# Patient Record
Sex: Male | Born: 1945 | Race: Black or African American | Hispanic: No
Health system: Southern US, Community
[De-identification: ages and names within clinical notes are randomized; demographics above are authoritative.]

## PROBLEM LIST (undated history)

## (undated) DIAGNOSIS — K219 Gastro-esophageal reflux disease without esophagitis: Secondary | ICD-10-CM

## (undated) DIAGNOSIS — M1990 Unspecified osteoarthritis, unspecified site: Secondary | ICD-10-CM

## (undated) DIAGNOSIS — F1010 Alcohol abuse, uncomplicated: Secondary | ICD-10-CM

## (undated) DIAGNOSIS — E119 Type 2 diabetes mellitus without complications: Secondary | ICD-10-CM

## (undated) DIAGNOSIS — I10 Essential (primary) hypertension: Secondary | ICD-10-CM

## (undated) DIAGNOSIS — M109 Gout, unspecified: Secondary | ICD-10-CM

## (undated) DIAGNOSIS — E0590 Thyrotoxicosis, unspecified without thyrotoxic crisis or storm: Secondary | ICD-10-CM

## (undated) HISTORY — DX: Type 2 diabetes mellitus without complications: E11.9

## (undated) HISTORY — PX: COLONOSCOPY: SHX174

## (undated) HISTORY — PX: NO PAST SURGERIES: SHX2092

---

## 2005-08-01 ENCOUNTER — Emergency Department (HOSPITAL_COMMUNITY): Admission: EM | Admit: 2005-08-01 | Discharge: 2005-08-01 | Payer: Self-pay | Admitting: Emergency Medicine

## 2005-11-05 ENCOUNTER — Emergency Department (HOSPITAL_COMMUNITY): Admission: EM | Admit: 2005-11-05 | Discharge: 2005-11-05 | Payer: Self-pay | Admitting: Emergency Medicine

## 2005-12-29 ENCOUNTER — Ambulatory Visit (HOSPITAL_COMMUNITY): Admission: RE | Admit: 2005-12-29 | Discharge: 2005-12-29 | Payer: Self-pay | Admitting: Gastroenterology

## 2006-02-22 ENCOUNTER — Emergency Department (HOSPITAL_COMMUNITY): Admission: EM | Admit: 2006-02-22 | Discharge: 2006-02-22 | Payer: Self-pay | Admitting: Emergency Medicine

## 2006-07-19 ENCOUNTER — Emergency Department (HOSPITAL_COMMUNITY): Admission: EM | Admit: 2006-07-19 | Discharge: 2006-07-19 | Payer: Self-pay | Admitting: Emergency Medicine

## 2007-08-23 ENCOUNTER — Emergency Department (HOSPITAL_COMMUNITY): Admission: EM | Admit: 2007-08-23 | Discharge: 2007-08-24 | Payer: Self-pay | Admitting: Emergency Medicine

## 2007-08-24 ENCOUNTER — Inpatient Hospital Stay (HOSPITAL_COMMUNITY): Admission: RE | Admit: 2007-08-24 | Discharge: 2007-08-27 | Payer: Self-pay | Admitting: Psychiatry

## 2007-08-24 ENCOUNTER — Ambulatory Visit: Payer: Self-pay | Admitting: Psychiatry

## 2009-01-09 ENCOUNTER — Encounter: Admission: RE | Admit: 2009-01-09 | Discharge: 2009-01-09 | Payer: Self-pay | Admitting: Family Medicine

## 2011-04-14 NOTE — H&P (Signed)
[name], [name]              ACCOUNT NO.:  [id number]   MEDICAL RECORD NO.:  [id number]          PATIENT TYPE:  IPS   LOCATION:  0504                          FACILITY:  BH   PHYSICIAN:  Geoffery Lyons, M.D.      DATE OF BIRTH:  10/04/1946   DATE OF ADMISSION:  08/24/2007  DATE OF DISCHARGE:                       PSYCHIATRIC ADMISSION ASSESSMENT   IDENTIFYING INFORMATION:  This is a 65 year old single male voluntarily  admitted on August 23, 2007.   HISTORY OF PRESENT ILLNESS:  The patient presents with a history of  alcohol and Xanax use.  Has been drinking liquor daily along with taking  1 mg of Xanax every day.  He reports he has had episodes of drinking at  work to help him out with his nerves.  His last drink was 3-4 days ago.  He denies any specific stressors.  He denies any depression and suicidal  thoughts.   PAST PSYCHIATRIC HISTORY:  This is the first admission to [facility].  No other psychiatric admissions.  No history of being  detoxed prior.   SOCIAL HISTORY:  This is a 65 year old male.  He currently lives with  his girlfriend.  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  No legal problems.   FAMILY HISTORY:  None.   ALCOHOL/DRUG HISTORY:  The patient is a nonsmoker.  He has again been  using benzodiazepines.  Denies any other drug use.   PRIMARY CARE PHYSICIAN:  Dr. Thermon Leyland and Dr. Loleta Chance in [place].   MEDICAL HISTORY:  Hypertension and gastroesophageal reflux disease.   MEDICATIONS:  Has been Atacand and hydrochlorothiazide and Prevacid 30  mg daily.   ALLERGIES:  No known allergies.   PHYSICAL EXAMINATION:  The patient was fully assessed at [facility]gency [facility] where he did receive some Ativan.  Temperature is  99.2, heart rate 73, respirations 20, blood pressure 118/73.   LABORATORY DATA:  His glucose is 123, hemoglobin 18.4 with hematocrit  54, creatinine 1.4.  Urine drug screen was positive for  benzodiazepines.  Alcohol level 178.   MENTAL STATUS EXAM:  This is a middle-aged male, sleepy.  Was awakened  to do the interview.  He was cooperative with good eye contact.  His  speech is clear, normal pace and tone.  The patient's mood is neutral.  The patient's affect is appropriate to circumstances.  Thought processes  with no evidence of any thought disorder.  Cognitive function intact.  His memory is good.  Judgment and insight are good.  Poor impulse  control.  He appears sincere.   DIAGNOSES:  AXIS I:  Alcohol dependence.  Benzodiazepines abuse; rule  out dependence.  AXIS II:  Deferred.  AXIS III:  Hypertension and gastroesophageal reflux disease.  AXIS IV:  Deferred.  AXIS IV:  40.   PLAN:  To contract for safety.  Will detox the patient with the Librium  protocol which was reviewed.  Will work on relapse prevention.  Will  resume his current medications.  Casemanager will assess his follow-up  and will also assess  any comorbidities as the patient continues to  detox.  Will also encourage group activity.   TENTATIVE LENGTH OF STAY:  Four to five days.      Landry Corporal, N.P.      Geoffery Lyons, M.D.  Electronically Signed    JO/MEDQ  D:  08/24/2007  T:  08/24/2007  Job:  [id number]

## 2011-04-17 NOTE — Discharge Summary (Signed)
NAMEBREYLIN, DOM              ACCOUNT NO.:  [id number]   MEDICAL RECORD NO.:  [id number]          PATIENT TYPE:  IPS   LOCATION:  0504                          FACILITY:  BH   PHYSICIAN:  Geoffery Lyons, M.D.      DATE OF BIRTH:  07-Sep-1946   DATE OF ADMISSION:  08/24/2007  DATE OF DISCHARGE:  08/27/2007                               DISCHARGE SUMMARY   CHIEF COMPLAINT AND PRESENT ILLNESS:  This was the first admission to  [facility] for this 65 year old single male  voluntarily admitted.  Presented with a history of alcohol and Xanax  abuse.  Has been drinking liquor daily along with taking 1 mg of Xanax  every day.  He had episodes of drinking at work to help him out with  his nerves.  Last drink was 3-4 days prior to this admission.   PAST PSYCHIATRIC HISTORY:  First time at [facility].  No other  psychiatric admissions.   ALCOHOL/DRUG HISTORY:  Has been drinking on a regular basis, mostly  liquor and taking Xanax.   MEDICAL HISTORY:  Hypertension, gastroesophageal reflux.   MEDICATIONS:  Had been on Atacand and hydrochlorothiazide and Prevacid  30 mg per day.   PHYSICAL EXAMINATION:  Performed and failed to show any acute findings.   LABORATORY DATA:  Glucose 123, hemoglobin 18.4, creatinine 1.4.  UDS  positive for benzodiazepines.  Alcohol level 178.   MENTAL STATUS EXAM:  Male that initially was pretty sleepy but awakened  to the interview, cooperative, good eye contact.  Speech was clear,  normal rate, tempo and production.  Mood anxious.  Affect broad.  Thought processes logical, coherent and relevant.  No evidence of  delusions.  No active suicidal or homicidal ideation.  No  hallucinations.  Cognition well-preserved.   ADMISSION DIAGNOSES:  AXIS I:  Alcohol dependence.  Benzodiazepine  abuse; rule out dependence.  AXIS II:  No diagnosis.  AXIS III:  Hypertension, gastroesophageal reflux.  AXIS IV:  Moderate.  AXIS V:  GAF upon  admission 35; highest GAF in the last year 60.   HOSPITAL COURSE:  He was admitted and started in individual group  psychotherapy.  Was detoxified with Librium.  He was given trazodone for  sleep.  He was maintained on the Atacand/hydrochlorothiazide 32/12.5 mg  per day and the Prevacid 30 mg per day.  He was given some Vistaril as  needed for anxiety.  Endorsed that he was trying to get his life  together, drinking a pint day, also a Xanax 1 mg in the morning.  For  the last 3-4 months, had been drinking a pint of liquor.  Endorsed he  has a little nerve problem, taking a Xanax every now and then.  Endorsed that he could stop drinking for years.  He is actually retired.  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  No children.  He did quit for 5-6 months.  Endorsed that he  was messing around with the wrong people when he relapsed.  On August 25, 2007, he was still having a hard time.  Usually anxious, especially  in the morning.  Using a piece of the Xanax in the morning.  He endorsed  that he was also drinking with the Xanax.  We pursued detox.  We worked  on Pharmacologist.  We worked on relapse prevention.  He continued to  endorse some anxiety in the morning.  We tried some Vistaril.  On  August 27, 2007, he reported feeling great.  Feeling that he was  ready to be discharged.  Was planning to go to church and keep busy.  He  endorsed no suicidal or homicidal ideation.  There were no evidence of  acute withdrawal so we went ahead and discharged to outpatient follow-  up.   DISCHARGE DIAGNOSES:  AXIS I:  Alcohol dependence.  Benzodiazepine  abuse.  Anxiety disorder not otherwise specified.  AXIS II:  No diagnosis.  AXIS III:  Arterial hypertension, gastroesophageal reflux.  AXIS IV:  Moderate.  AXIS V:  GAF upon discharge 55-60.   DISCHARGE MEDICATIONS:  1. Atacand/hydrochlorothiazide 32/12.5 mg, 1 daily.  2. Prevacid 30 mg per day.  3.  Trazodone 50 mg, 1 at bedtime as needed for sleep.  4. Vistaril 25 mg, 1 twice a day as needed for anxiety.   FOLLOWUP:  ADS in [place].      Geoffery Lyons, M.D.  Electronically Signed     IL/MEDQ  D:  09/27/2007  T:  09/27/2007  Job:  [id number]

## 2011-04-18 ENCOUNTER — Emergency Department (HOSPITAL_COMMUNITY)
Admission: EM | Admit: 2011-04-18 | Discharge: 2011-04-19 | Disposition: A | Discharge status: Other Acute Inpatient Hospital | Payer: 59 | Attending: Emergency Medicine | Admitting: Emergency Medicine

## 2011-04-18 DIAGNOSIS — E039 Hypothyroidism, unspecified: Secondary | ICD-10-CM | POA: Insufficient documentation

## 2011-04-18 DIAGNOSIS — R1013 Epigastric pain: Secondary | ICD-10-CM | POA: Insufficient documentation

## 2011-04-18 DIAGNOSIS — K2990 Gastroduodenitis, unspecified, without bleeding: Secondary | ICD-10-CM | POA: Insufficient documentation

## 2011-04-18 DIAGNOSIS — K2970 Gastritis, unspecified, without bleeding: Secondary | ICD-10-CM | POA: Insufficient documentation

## 2011-04-18 DIAGNOSIS — I10 Essential (primary) hypertension: Secondary | ICD-10-CM | POA: Insufficient documentation

## 2011-04-18 DIAGNOSIS — F1010 Alcohol abuse, uncomplicated: Secondary | ICD-10-CM | POA: Insufficient documentation

## 2011-04-18 DIAGNOSIS — E119 Type 2 diabetes mellitus without complications: Secondary | ICD-10-CM | POA: Insufficient documentation

## 2011-04-18 LAB — CBC
HCT: 42.9 % (ref 39.0–52.0)
Hemoglobin: 14.6 g/dL (ref 13.0–17.0)
MCV: 90.9 fL (ref 78.0–100.0)
RBC: 4.72 MIL/uL (ref 4.22–5.81)
RDW: 15 % (ref 11.5–15.5)
WBC: 4.4 10*3/uL (ref 4.0–10.5)

## 2011-04-18 LAB — DIFFERENTIAL
Basophils Absolute: 0 10*3/uL (ref 0.0–0.1)
Eosinophils Relative: 3 % (ref 0–5)
Lymphocytes Relative: 55 % — ABNORMAL HIGH (ref 12–46)
Lymphs Abs: 2.4 10*3/uL (ref 0.7–4.0)
Neutro Abs: 1.5 10*3/uL — ABNORMAL LOW (ref 1.7–7.7)
Neutrophils Relative %: 34 % — ABNORMAL LOW (ref 43–77)

## 2011-04-18 LAB — BASIC METABOLIC PANEL
CO2: 27 mEq/L (ref 19–32)
Calcium: 9.7 mg/dL (ref 8.4–10.5)
GFR calc Af Amer: >60 mL/min (ref >60)
GFR calc non Af Amer: >60 mL/min (ref >60)
Glucose, Bld: 132 mg/dL — ABNORMAL HIGH (ref 70–99)
Potassium: 3.8 mEq/L (ref 3.5–5.1)
Sodium: 140 mEq/L (ref 135–145)

## 2011-04-18 LAB — RAPID URINE DRUG SCREEN, HOSP PERFORMED
Benzodiazepines: NOT DETECTED
Cocaine: NOT DETECTED
Opiates: NOT DETECTED
Tetrahydrocannabinol: NOT DETECTED

## 2011-04-19 ENCOUNTER — Inpatient Hospital Stay (HOSPITAL_COMMUNITY)
Admission: EM | Admit: 2011-04-19 | Discharge: 2011-04-22 | DRG: 897 | Disposition: A | Discharge status: Home / Self Care | Payer: 59 | Source: Other Acute Inpatient Hospital | Attending: Psychiatry | Admitting: Psychiatry

## 2011-04-19 DIAGNOSIS — E119 Type 2 diabetes mellitus without complications: Secondary | ICD-10-CM

## 2011-04-19 DIAGNOSIS — F1020 Alcohol dependence, uncomplicated: Principal | ICD-10-CM

## 2011-04-19 DIAGNOSIS — R109 Unspecified abdominal pain: Secondary | ICD-10-CM

## 2011-04-19 DIAGNOSIS — E039 Hypothyroidism, unspecified: Secondary | ICD-10-CM

## 2011-04-19 DIAGNOSIS — I10 Essential (primary) hypertension: Secondary | ICD-10-CM

## 2011-04-19 LAB — GLUCOSE, CAPILLARY

## 2011-04-20 LAB — GLUCOSE, CAPILLARY: Glucose-Capillary: 115 mg/dL — ABNORMAL HIGH (ref 70–99)

## 2011-04-21 LAB — HEPATIC FUNCTION PANEL
Albumin: 3.4 g/dL — ABNORMAL LOW (ref 3.5–5.2)
Indirect Bilirubin: 0.3 mg/dL (ref 0.3–0.9)
Total Protein: 6.5 g/dL (ref 6.0–8.3)

## 2011-04-21 LAB — GLUCOSE, CAPILLARY: Glucose-Capillary: 166 mg/dL — ABNORMAL HIGH (ref 70–99)

## 2011-04-22 LAB — GLUCOSE, CAPILLARY: Glucose-Capillary: 122 mg/dL — ABNORMAL HIGH (ref 70–99)

## 2011-04-24 NOTE — Discharge Summary (Signed)
  Kevin [name], FELDHAUS              ACCOUNT NO.:  [id number]  MEDICAL RECORD NO.:  [id number]           PATIENT TYPE:  I  LOCATION:  0307                          FACILITY:  BH  PHYSICIAN:  Franchot Gallo, MD     DATE OF BIRTH:  28-Jan-1946  DATE OF ADMISSION:  04/19/2011 DATE OF DISCHARGE:  04/22/2011                              DISCHARGE SUMMARY   REASON FOR ADMISSION:  This is a 65 year old male that was admitted with a history of alcohol abuse drinking four pints of brandy for the past 3 months, drinking it straight.  He was having some medical issues and problems with stomach pain.  He denied any suicidal thoughts.  FINAL IMPRESSION:  AXIS I:  Alcohol dependence. AXIS II:  Deferred. AXIS III:  History of hypertension, diabetes, hypothyroidism. AXIS IV:  Recently retired. AXIS V:  55.  LABORATORY DATA:  Glucose was elevated at 132.  Urine drug screen was negative.  His alcohol level was 240. SIGNIFICANT FINDINGS:  This was a tremulous and anxious male.  Speech was clear.  He denied any suicidal or homicidal thoughts or psychotic symptoms.  His attention and concentration were poor.  Insight and judgment were satisfactory.  He was admitted to the substance abuse program.  We monitored his withdrawal symptoms.  He was placed on the Librium protocol and assessed his motivation for rehab.  He was beginning to feel a little better.  His appetite was returning.  Having some withdrawal symptoms.  His sleep was improving, feeling more calm. He was doing well on his detox medications, but still feeling a little bit shaky.  Denied any suicidal thoughts.  Did not commit to attending AA meetings.  On day of discharge, the patient was sleeping well.  His appetite was good.  He was having no depression.  No suicidal or homicidal thoughts or auditory hallucinations.  Having some mild anxiety.  No alcohol withdrawal symptoms.  He was stable for discharge.  DISCHARGE MEDICATIONS: 1.  Vistaril 25 mg one b.i.d. p.r.n. 2. Levothyroxine 100 mg daily. 3. Metformin 1000 mg nightly. 4. Tribenzor one tablet daily. 5. Vitamin D weekly.  FOLLOW UP:  He has a follow up appointment with [facility].  He was provided a schedule.     Landry Corporal, N.P.   ______________________________ Franchot Gallo, MD    JO/MEDQ  D:  04/23/2011  T:  04/24/2011  Job:  [id number]  Electronically Signed by Limmie PatriciaP. on 04/24/2011 02:16:31 PM Electronically Signed by Franchot Gallo MD on 04/24/2011 05:01:40 PM

## 2011-05-20 ENCOUNTER — Emergency Department (HOSPITAL_COMMUNITY): Payer: 59

## 2011-05-20 ENCOUNTER — Inpatient Hospital Stay (HOSPITAL_COMMUNITY)
Admission: AD | Admit: 2011-05-20 | Discharge: 2011-05-25 | DRG: 897 | Disposition: A | Discharge status: Home / Self Care | Payer: 59 | Source: Ambulatory Visit | Attending: Psychiatry | Admitting: Psychiatry

## 2011-05-20 ENCOUNTER — Emergency Department (HOSPITAL_COMMUNITY)
Admission: EM | Admit: 2011-05-20 | Discharge: 2011-05-20 | Disposition: A | Discharge status: Other Acute Inpatient Hospital | Payer: 59 | Attending: Emergency Medicine | Admitting: Emergency Medicine

## 2011-05-20 DIAGNOSIS — E039 Hypothyroidism, unspecified: Secondary | ICD-10-CM | POA: Insufficient documentation

## 2011-05-20 DIAGNOSIS — I10 Essential (primary) hypertension: Secondary | ICD-10-CM

## 2011-05-20 DIAGNOSIS — E119 Type 2 diabetes mellitus without complications: Secondary | ICD-10-CM

## 2011-05-20 DIAGNOSIS — F1020 Alcohol dependence, uncomplicated: Principal | ICD-10-CM

## 2011-05-20 DIAGNOSIS — R259 Unspecified abnormal involuntary movements: Secondary | ICD-10-CM

## 2011-05-20 DIAGNOSIS — R5383 Other fatigue: Secondary | ICD-10-CM | POA: Insufficient documentation

## 2011-05-20 DIAGNOSIS — R5381 Other malaise: Secondary | ICD-10-CM | POA: Insufficient documentation

## 2011-05-20 DIAGNOSIS — F10239 Alcohol dependence with withdrawal, unspecified: Secondary | ICD-10-CM

## 2011-05-20 DIAGNOSIS — F10939 Alcohol use, unspecified with withdrawal, unspecified: Secondary | ICD-10-CM

## 2011-05-20 LAB — CBC
HCT: 44.8 % (ref 39.0–52.0)
Hemoglobin: 15 g/dL (ref 13.0–17.0)
MCH: 30.2 pg (ref 26.0–34.0)
RBC: 4.96 MIL/uL (ref 4.22–5.81)

## 2011-05-20 LAB — COMPREHENSIVE METABOLIC PANEL
ALT: 70 U/L — ABNORMAL HIGH (ref 0–53)
Albumin: 3.3 g/dL — ABNORMAL LOW (ref 3.5–5.2)
Alkaline Phosphatase: 59 U/L (ref 39–117)
Calcium: 9.4 mg/dL (ref 8.4–10.5)
GFR calc Af Amer: >60 mL/min (ref >60)
Glucose, Bld: 135 mg/dL — ABNORMAL HIGH (ref 70–99)
Potassium: 3.8 mEq/L (ref 3.5–5.1)
Sodium: 141 mEq/L (ref 135–145)
Total Protein: 7.3 g/dL (ref 6.0–8.3)

## 2011-05-20 LAB — DIFFERENTIAL
Basophils Absolute: 0 10*3/uL (ref 0.0–0.1)
Basophils Relative: 0 % (ref 0–1)
Lymphocytes Relative: 25 % (ref 12–46)
Monocytes Absolute: 0.3 10*3/uL (ref 0.1–1.0)
Monocytes Relative: 4 % (ref 3–12)
Neutro Abs: 5.2 10*3/uL (ref 1.7–7.7)
Neutrophils Relative %: 69 % (ref 43–77)

## 2011-05-20 LAB — GLUCOSE, CAPILLARY

## 2011-05-20 LAB — RAPID URINE DRUG SCREEN, HOSP PERFORMED
Amphetamines: NOT DETECTED
Barbiturates: NOT DETECTED
Cocaine: NOT DETECTED
Opiates: NOT DETECTED
Tetrahydrocannabinol: NOT DETECTED

## 2011-05-21 DIAGNOSIS — F1020 Alcohol dependence, uncomplicated: Secondary | ICD-10-CM

## 2011-05-21 LAB — GLUCOSE, CAPILLARY
Glucose-Capillary: 164 mg/dL — ABNORMAL HIGH (ref 70–99)
Glucose-Capillary: 129 mg/dL — ABNORMAL HIGH (ref 70–99)

## 2011-05-22 LAB — GLUCOSE, CAPILLARY: Glucose-Capillary: 132 mg/dL — ABNORMAL HIGH (ref 70–99)

## 2011-05-23 LAB — GLUCOSE, CAPILLARY
Glucose-Capillary: 141 mg/dL — ABNORMAL HIGH (ref 70–99)
Glucose-Capillary: 124 mg/dL — ABNORMAL HIGH (ref 70–99)

## 2011-05-24 LAB — GLUCOSE, CAPILLARY
Glucose-Capillary: 141 mg/dL — ABNORMAL HIGH (ref 70–99)
Glucose-Capillary: 107 mg/dL — ABNORMAL HIGH (ref 70–99)

## 2011-05-25 LAB — GLUCOSE, CAPILLARY: Glucose-Capillary: 104 mg/dL — ABNORMAL HIGH (ref 70–99)

## 2011-05-25 NOTE — H&P (Signed)
NAMERODEL, GLASPY              ACCOUNT NO.:  [id number]  MEDICAL RECORD NO.:  [id number]  LOCATION:  0301                          FACILITY:  BH  PHYSICIAN:  Debbora Lacrosse, MD       DATE OF BIRTH:  1945-12-21  DATE OF ADMISSION:  05/20/2011 DATE OF DISCHARGE:                      PSYCHIATRIC ADMISSION ASSESSMENT   This is a 65 year old male admitted on May 20, 2011.  HISTORY OF PRESENT ILLNESS:  The patient is here to get detoxed off alcohol.  He reports he was sober for 3 weeks after his discharge in May from being detoxed at that time.  He states he did well for a few weeks and then started drinking with friends.  His last drink was 2 days ago. He has been drinking a couple pints daily.  He reports he has been drinking in the morning.  He denies any seizures or blackouts.  He has not been sleeping well,  is having trouble with his appetite but reports no weight loss.  He denies any suicidal thoughts.  PAST PSYCHIATRIC HISTORY:  Again the patient was here in May for alcohol detox.  He did not go to any rehab facility and was just attending meetings.  SOCIAL HISTORY:  The patient is a 66 year old single male who has no children.  He lives alone.  He lives in [place].  He has no legal issues.  He works as a tow Naval architect. Longest history of sobriety has been 1 year.  FAMILY HISTORY:  Uncles with alcohol problems.  Alcohol and drug history again as above.  No seizures.  No blackouts. No legal troubles. Drinking in the morning.  Denies any other recreational drug use.  Primary care provider is Dr. Thermon Leyland.  PAST MEDICAL HISTORY:  History of diabetes, non-insulin dependent, hypertension that he states is "under control". Hypothyroidism.  MEDICATIONS:  He lists levothyroxine 100 mcg daily, vitamin D two weekly, Tribenzor one daily, hydroxyzine 25 mg taking one to two q.6 hours  p.r.n. anxiety.  Colcrys 0.6 mg one daily, metformin 1000 mg daily.  DRUG ALLERGIES:   No known allergies.  PHYSICAL EXAMINATION:  GENERAL APPEARANCE:  Physical exam was done in the emergency room. This is a normally-developed male.  He appears in no distress.  He does have very reddened eyes but he denies any physical discomfort at this time.  LABORATORY FINDINGS:  His urine drug screen is positive for benzodiazepines. Blood alcohol levels at 172.  His SGOT is elevated at 128, SGPT is elevated at 70.  MENTAL STATUS EXAM:  He is fully alert and cooperative.  He is casually dressed.  He somewhat unkempt.  He appears in no distress.  His speech is soft-spoken. He is feeling anxious.  Thought processes are coherent, goal directed.  No evidence of any psychotic symptoms.  Cognitive function intact.  Memory appears intact.  Judgment and insight are fair. Poor impulse control related to alcohol use.  IMPRESSION:  AXIS I:  Alcohol dependence. AXIS II:  Deferred. AXIS III:  History of diabetes, hypertension, hypothyroidism. AXIS IV:  Psychosocial problems related to chronic alcohol use. AXIS V:  Current is 40.  PLAN:  Librium protocol. We will check his blood sugars  twice a day, resume his  medical medications,  continue to assess comorbidities and assess his motivation for rehab.  The patient was to follow up with [facility], not AA meetings.  The patient had appointments with  [facility].     Landry Corporal, N.P.   ______________________________ Debbora Lacrosse, MD    JO/MEDQ  D:  05/21/2011  T:  05/21/2011  Job:  [id number]  Electronically Signed by Limmie Patricia.P. on 05/22/2011 02:59:35 PM Electronically Signed by Andi Devon Aarit Kashuba  on 05/25/2011 12:16:57 PM

## 2011-05-26 NOTE — Discharge Summary (Signed)
  Kevin [name], Kevin [name]              ACCOUNT NO.:  [id number]  MEDICAL RECORD NO.:  [id number]  LOCATION:  0301                          FACILITY:  BH  PHYSICIAN:  Debbora Lacrosse, MD       DATE OF BIRTH:  11/22/1946  DATE OF ADMISSION:  05/20/2011 DATE OF DISCHARGE:  05/25/2011                              DISCHARGE SUMMARY   REASON FOR ADMISSION:  A 65 year old African American male here to get detoxed off alcohol.  He reported recently being at this facility for the same event and requested not to go into any sort of rehab at that time, and subsequently relapsed.  He reports he was sober for about 3 weeks after discharge and then starting drinking a few drinks, and then heavily.  He reports he has been drinking all day.  Denied having any seizures, blackouts, or having any trouble with his appetite.  Denied any suicidal or psychotic symptoms at all during the hospitalization.  FINAL DIAGNOSES:  AXIS I:  Alcohol dependence. AXIS II:  Deferred. AXIS III:  History of diabetes, hypertension, hypothyroidism. AXIS IV:  Problems with support, social environment, occupation, economic. AXIS V:  50.  PERTINENT LABS:  TSH is 2.35, in the normal range.  Urine drug screen is positive for benzodiazepines, as expected.  CBC is normal.  Lipase is 55, normal.  Alcohol level was 172 in the emergency department. Comprehensive metabolic panel, glucose was 135, SGOT was 128, SGPT 70, otherwise normal.  Hepatic function panel as noted.  COURSE IN HOSPITAL:  The patient was admitted to the inpatient unit on May 20, 2011, for detox.  He participated well in unit activities, group and individual, as he has been at this facility in the past.  He was started on the Librium detox protocol and tolerated this medication and had limited, if any alcohol withdrawal symptoms.  His vital signs were stable.  He did have some tremors during the initial part of hospitalization but these leveled off and had no  tremors on day of discharge.  His blood pressure remained fairly stable with medication and his pulse did not show any elevation due to withdrawal.  He also remained on his nonpsychiatric medications.  At no point in the hospitalization did he have suicidal ideation, and on the day of discharge he has plans to follow up with [facility] Health at [facility], and will attend AA and NA meetings.  He again declines any further inpatient rehab treatment.  DISCHARGE MEDICATIONS: 1. Synthroid 100 mcg daily. 2. Colchicine 0.6 mg daily. 3. Benicar 40 mg daily. 4. Norvasc 10 mg daily. 5. Hydrochlorothiazide 12.5 mg daily. 6. Metformin 500 mg b.i.d.  DISCHARGE FOLLOWUP:  As noted, followup will be with [facility] and will attend AA and NA meetings.  He will follow up with his primary care physician for nonpsychiatric medications and further evaluation of those.          ______________________________ Debbora Lacrosse, MD     WS/MEDQ  D:  05/25/2011  T:  05/25/2011  Job:  [id number]  Electronically Signed by Andi Devon Daphnie Venturini  on 05/26/2011 08:13:58 AM

## 2011-09-10 LAB — ETHANOL: Alcohol, Ethyl (B): 178 — ABNORMAL HIGH

## 2011-09-10 LAB — I-STAT 8, (EC8 V) (CONVERTED LAB)
Acid-base deficit: 1
Chloride: 106
HCT: 54 — ABNORMAL HIGH
Operator id: 270111
Potassium: 4
TCO2: 24
pCO2, Ven: 36.8 — ABNORMAL LOW
pH, Ven: 7.404 — ABNORMAL HIGH

## 2011-09-10 LAB — HEPATIC FUNCTION PANEL
Bilirubin, Direct: 0.4 — ABNORMAL HIGH
Indirect Bilirubin: 1.1 — ABNORMAL HIGH
Total Bilirubin: 1.5 — ABNORMAL HIGH

## 2011-09-10 LAB — TSH: TSH: 4.208

## 2011-09-10 LAB — RAPID URINE DRUG SCREEN, HOSP PERFORMED
Amphetamines: NOT DETECTED
Cocaine: NOT DETECTED
Opiates: NOT DETECTED
Tetrahydrocannabinol: NOT DETECTED

## 2013-01-23 ENCOUNTER — Encounter (HOSPITAL_COMMUNITY): Payer: Self-pay

## 2013-01-23 ENCOUNTER — Emergency Department (HOSPITAL_COMMUNITY)
Admission: EM | Admit: 2013-01-23 | Discharge: 2013-01-24 | Disposition: A | Discharge status: Other Acute Inpatient Hospital | Payer: 59 | Attending: Emergency Medicine | Admitting: Emergency Medicine

## 2013-01-23 DIAGNOSIS — Z8639 Personal history of other endocrine, nutritional and metabolic disease: Secondary | ICD-10-CM | POA: Insufficient documentation

## 2013-01-23 DIAGNOSIS — Z8739 Personal history of other diseases of the musculoskeletal system and connective tissue: Secondary | ICD-10-CM | POA: Insufficient documentation

## 2013-01-23 DIAGNOSIS — Z862 Personal history of diseases of the blood and blood-forming organs and certain disorders involving the immune mechanism: Secondary | ICD-10-CM | POA: Insufficient documentation

## 2013-01-23 DIAGNOSIS — N289 Disorder of kidney and ureter, unspecified: Secondary | ICD-10-CM

## 2013-01-23 DIAGNOSIS — F1010 Alcohol abuse, uncomplicated: Secondary | ICD-10-CM

## 2013-01-23 HISTORY — DX: Unspecified osteoarthritis, unspecified site: M19.90

## 2013-01-23 HISTORY — DX: Alcohol abuse, uncomplicated: F10.10

## 2013-01-23 HISTORY — DX: Gout, unspecified: M10.9

## 2013-01-23 LAB — COMPREHENSIVE METABOLIC PANEL
ALT: 33 U/L (ref 0–53)
Alkaline Phosphatase: 53 U/L (ref 39–117)
Chloride: 101 mEq/L (ref 96–112)
GFR calc Af Amer: 58 mL/min — ABNORMAL LOW (ref >90)
Glucose, Bld: 110 mg/dL — ABNORMAL HIGH (ref 70–99)
Potassium: 3.9 mEq/L (ref 3.5–5.1)
Sodium: 138 mEq/L (ref 135–145)
Total Bilirubin: 0.5 mg/dL (ref 0.3–1.2)
Total Protein: 7.3 g/dL (ref 6.0–8.3)

## 2013-01-23 LAB — CBC WITH DIFFERENTIAL/PLATELET
Eosinophils Absolute: 0.1 10*3/uL (ref 0.0–0.7)
Hemoglobin: 13.5 g/dL (ref 13.0–17.0)
Lymphocytes Relative: 36 % (ref 12–46)
Lymphs Abs: 2.7 10*3/uL (ref 0.7–4.0)
MCH: 30.5 pg (ref 26.0–34.0)
Neutro Abs: 4.2 10*3/uL (ref 1.7–7.7)
Neutrophils Relative %: 56 % (ref 43–77)
Platelets: 231 10*3/uL (ref 150–400)
RBC: 4.42 MIL/uL (ref 4.22–5.81)
WBC: 7.4 10*3/uL (ref 4.0–10.5)

## 2013-01-23 LAB — RAPID URINE DRUG SCREEN, HOSP PERFORMED
Amphetamines: NOT DETECTED
Tetrahydrocannabinol: NOT DETECTED

## 2013-01-23 LAB — ETHANOL: Alcohol, Ethyl (B): 78 mg/dL — ABNORMAL HIGH (ref 0–11)

## 2013-01-23 MED ORDER — AMLODIPINE BESYLATE 10 MG PO TABS
10.0000 mg | ORAL_TABLET | Freq: Every day | ORAL | Status: DC
  Filled 2013-01-23: qty 1

## 2013-01-23 MED ORDER — IBUPROFEN 600 MG PO TABS: 600.0000 mg | ORAL_TABLET | Freq: Three times a day (TID) | ORAL | Status: DC | PRN

## 2013-01-23 MED ORDER — LEVOTHYROXINE SODIUM 100 MCG PO TABS
100.0000 ug | ORAL_TABLET | Freq: Every day | ORAL | Status: DC
  Administered 2013-01-23: 100 ug via ORAL
  Filled 2013-01-23 (×2): qty 1

## 2013-01-23 MED ORDER — OLMESARTAN MEDOXOMIL 40 MG PO TABS
40.0000 mg | ORAL_TABLET | Freq: Every day | ORAL | Status: DC
  Filled 2013-01-23: qty 1

## 2013-01-23 MED ORDER — METFORMIN HCL ER 500 MG PO TB24
1000.0000 mg | ORAL_TABLET | Freq: Every day | ORAL | Status: DC
  Filled 2013-01-23 (×2): qty 2

## 2013-01-23 MED ORDER — LORAZEPAM 1 MG PO TABS
1.0000 mg | ORAL_TABLET | Freq: Three times a day (TID) | ORAL | Status: DC | PRN
  Administered 2013-01-23 – 2013-01-24 (×2): 1 mg via ORAL
  Filled 2013-01-23 (×2): qty 1

## 2013-01-23 MED ORDER — OLMESARTAN MEDOXOMIL 40 MG PO TABS
40.0000 mg | ORAL_TABLET | Freq: Every day | ORAL | Status: DC
  Administered 2013-01-23 – 2013-01-24 (×2): 40 mg via ORAL
  Filled 2013-01-23 (×2): qty 1

## 2013-01-23 MED ORDER — HYDROCHLOROTHIAZIDE 12.5 MG PO CAPS
12.5000 mg | ORAL_CAPSULE | Freq: Every day | ORAL | Status: DC
  Administered 2013-01-23 – 2013-01-24 (×2): 12.5 mg via ORAL
  Filled 2013-01-23 (×2): qty 1

## 2013-01-23 MED ORDER — ONDANSETRON HCL 4 MG PO TABS: 4.0000 mg | ORAL_TABLET | Freq: Three times a day (TID) | ORAL | Status: DC | PRN

## 2013-01-23 MED ORDER — METFORMIN HCL ER 500 MG PO TB24
1000.0000 mg | ORAL_TABLET | Freq: Every day | ORAL | Status: DC
  Administered 2013-01-23: 1000 mg via ORAL
  Filled 2013-01-23 (×2): qty 2

## 2013-01-23 MED ORDER — SODIUM CHLORIDE 0.9 % IV BOLUS (SEPSIS): 1000.0000 mL | Freq: Once | INTRAVENOUS | Status: DC

## 2013-01-23 MED ORDER — ACETAMINOPHEN 325 MG PO TABS: 650.0000 mg | ORAL_TABLET | ORAL | Status: DC | PRN

## 2013-01-23 MED ORDER — PANTOPRAZOLE SODIUM 40 MG PO TBEC
40.0000 mg | DELAYED_RELEASE_TABLET | Freq: Every day | ORAL | Status: DC
  Administered 2013-01-24: 40 mg via ORAL
  Filled 2013-01-23: qty 1

## 2013-01-23 MED ORDER — AMLODIPINE BESYLATE 10 MG PO TABS
10.0000 mg | ORAL_TABLET | Freq: Every day | ORAL | Status: DC
  Administered 2013-01-23 – 2013-01-24 (×2): 10 mg via ORAL
  Filled 2013-01-23 (×2): qty 1

## 2013-01-23 MED ORDER — NICOTINE 21 MG/24HR TD PT24
21.0000 mg | MEDICATED_PATCH | Freq: Every day | TRANSDERMAL | Status: DC
  Filled 2013-01-23: qty 1

## 2013-01-23 MED ORDER — ZOLPIDEM TARTRATE 5 MG PO TABS: 5.0000 mg | ORAL_TABLET | Freq: Every evening | ORAL | Status: DC | PRN

## 2013-01-23 MED ORDER — OLMESARTAN-AMLODIPINE-HCTZ 40-10-12.5 MG PO TABS: 1.0000 | ORAL_TABLET | Freq: Every day | ORAL | Status: DC

## 2013-01-23 MED ORDER — HYDROCHLOROTHIAZIDE 12.5 MG PO CAPS
12.5000 mg | ORAL_CAPSULE | Freq: Every day | ORAL | Status: DC
  Filled 2013-01-23: qty 1

## 2013-01-23 MED ORDER — ALUM & MAG HYDROXIDE-SIMETH 200-200-20 MG/5ML PO SUSP: 30.0000 mL | ORAL | Status: DC | PRN

## 2013-01-23 NOTE — BH Assessment (Addendum)
Assessment Note   Kevin [name] is an 67 y.o. male who presents to the ED requesting detox. CSW met with pt at bedside to complete [facility] assessment. Pt denies SI/HI/VH/AH. Pt reports that he drinks 1/5 of liquor daily. Pt reports that he also takes hydrocodone 10-20 mg per day that are 5mg  to 10 mg tablets. Pt reports that these medications are not prescribed to him. Pt reprots that he takes hydrocodone for his gout pain.  Pt reports his last drink was today. Pt states that he started drinking again for the past 6 months after loss of a family member.   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 Arthritis   . Gout   . Alcohol abuse    Axis IV: other psychosocial or environmental problems, problems related to social environment and problems with primary support group Axis V: 41-50 serious symptoms  Past Medical History:  Past Medical History  Diagnosis Date  . Arthritis   . Gout   . Alcohol abuse     History reviewed. No pertinent past surgical history.  Family History: No family history on file.  Social History:  reports that he has never smoked. He has never used smokeless tobacco. He reports that  drinks alcohol. He reports that he does not use illicit drugs.  Additional Social History:  Alcohol / Drug Use History of alcohol / drug use?: Yes Substance #1 Name of Substance 1: alcohol 1 - Age of First Use: teens 1 - Amount (size/oz): 1/5  1 - Frequency: daily 1 - Duration: months 1 - Last Use / Amount: today, 1/5 of liquor  Substance #2 Name of Substance 2: opiates oxycodone 2 - Age of First Use: unknown 2 - Amount (size/oz): 10-20 mg  2 - Frequency: daily 2 - Duration: months 2 - Last Use / Amount: today, 5/10 mg unsure   CIWA: CIWA-Ar BP: 120/52 mmHg Pulse Rate: 70 Nausea and Vomiting: no nausea and no vomiting Tactile Disturbances: none Tremor: no tremor Auditory Disturbances: not  present Paroxysmal Sweats: no sweat visible Visual Disturbances: not present Anxiety: mildly anxious Headache, Fullness in Head: none present Agitation: normal activity Orientation and Clouding of Sensorium: oriented and can do serial additions CIWA-Ar Total: 1 COWS: Clinical Opiate Withdrawal Scale (COWS) Resting Pulse Rate: Pulse Rate 80 or below Sweating: No report of chills or flushing Restlessness: Able to sit still Pupil Size: Pupils pinned or normal size for room light Bone or Joint Aches: Not present Runny Nose or Tearing: Not present GI Upset: No GI symptoms Tremor: No tremor Yawning: No yawning Anxiety or Irritability: Patient reports increasing irritability or anxiousness Gooseflesh Skin: Skin is smooth COWS Total Score: 1  Allergies: No Known Allergies  Home Medications:  (Not in a hospital admission)  OB/GYN Status:  No LMP for male patient.  General Assessment Data Location of Assessment: [facility] ED Living Arrangements: Alone Can pt return to current living arrangement?: Yes Admission Status: Voluntary Is patient capable of signing voluntary admission?: Yes Transfer from: Home Referral Source: Self/Family/Friend  Education Status Is patient currently in school?: No Highest grade of school patient has completed: highschool  Risk to self Suicidal Ideation: No Suicidal Intent: No Is patient at risk for suicide?: No Suicidal Plan?: No Access to Means: No What has been your use of drugs/alcohol within the last 12 months?: n Previous Attempts/Gestures: No How many times?: 0 Other Self Harm Risks: no Triggers for  Past Attempts: None known Intentional Self Injurious Behavior: None Family Suicide History: No Recent stressful life event(s): Other (Comment) (pt couldn't state ) Persecutory voices/beliefs?: No Depression: No Substance abuse history and/or treatment for substance abuse?: Yes  Risk to Others Homicidal Ideation: No Thoughts of Harm to Others:  No Current Homicidal Intent: No Current Homicidal Plan: No Access to Homicidal Means: No Identified Victim: n/a History of harm to others?: No Assessment of Violence: None Noted Violent Behavior Description: none Does patient have access to weapons?: No Criminal Charges Pending?: No Does patient have a court date: No  Psychosis Hallucinations: None noted Delusions: None noted  Mental Status Report Appear/Hygiene: Other (Comment) (calm and coopeartive) Eye Contact: Good Motor Activity: Freedom of movement Speech: Logical/coherent Level of Consciousness: Alert Mood: Sad Affect: Appropriate to circumstance Anxiety Level: Minimal Thought Processes: Coherent;Relevant Judgement: Unimpaired Orientation: Person;Place;Time;Situation Obsessive Compulsive Thoughts/Behaviors: None  Cognitive Functioning Concentration: Normal Memory: Recent Intact;Remote Intact IQ: Average Insight: Fair Impulse Control: Fair Appetite: Fair Sleep: No Change Vegetative Symptoms: None  ADLScreening [facility]) Patient's cognitive ability adequate to safely complete daily activities?: Yes Patient able to express need for assistance with ADLs?: Yes Independently performs ADLs?: Yes (appropriate for developmental age)  Abuse/Neglect [facility]) Physical Abuse: Denies Verbal Abuse: Denies Sexual Abuse: Denies  Prior Inpatient Therapy Prior Inpatient Therapy: Yes Prior Therapy Dates: can't recall Prior Therapy Facilty/Provider(s): [facility] Reason for Treatment: etoh  Prior Outpatient Therapy Prior Outpatient Therapy: No  ADL Screening (condition at time of admission) Patient's cognitive ability adequate to safely complete daily activities?: Yes Patient able to express need for assistance with ADLs?: Yes Independently performs ADLs?: Yes (appropriate for developmental age)       Abuse/Neglect Assessment (Assessment to be complete while patient is alone) Physical Abuse: Denies Verbal  Abuse: Denies Sexual Abuse: Denies Values / Beliefs Cultural Requests During Hospitalization: None Spiritual Requests During Hospitalization: None        Additional Information 1:1 In Past 12 Months?: No CIRT Risk: No Elopement Risk: No Does patient have medical clearance?: No     Disposition:  Disposition Initial Assessment Completed: Yes Disposition of Patient: Inpatient treatment program Type of inpatient treatment program: Adult  On Site Evaluation by:   Reviewed with Physician:     Catha Gosselin A 01/23/2013 8:55 PM

## 2013-01-23 NOTE — ED Provider Notes (Signed)
History     CSN: [id number]  Arrival date & time 01/23/13  1539   First MD Initiated Contact with Patient 01/23/13 1608      Chief Complaint  Patient presents with  . Medical Clearance  . Addiction Problem    (Consider location/radiation/quality/duration/timing/severity/associated sxs/prior treatment) HPI Pt presents with c/o requesting detox from alcohol.  Pt had detox approx 2 years ago and states that he did very well with it until approx 6 months ago- he began drinking again due to sister dying.  He occasionally takes hydrocodone for gout pain.  Denies other substance use.  Denies SI/HI.  Denies recent fever- no chest or abdominal pain, no vomiting or diarrhea.  He states he has been drinking- approx 1/2 pint prior to coming in today. There are no other associated systemic symptoms, there are no other alleviating or modifying factors.   Past Medical History  Diagnosis Date  . Arthritis   . Gout   . Alcohol abuse     History reviewed. No pertinent past surgical history.  No family history on file.  History  Substance Use Topics  . Smoking status: Never Smoker   . Smokeless tobacco: Never Used  . Alcohol Use: Yes     Comment: daily      Review of Systems ROS reviewed and all otherwise negative except for mentioned in HPI  Allergies  Review of patient's allergies indicates no known allergies.  Home Medications   No current outpatient prescriptions on file.  BP 148/75  Pulse 82  Temp(Src) 97.9 F (36.6 C) (Oral)  Resp 19  SpO2 95% Vitals reviewed Physical Exam Physical Examination: General appearance - alert, well appearing, and in no distress Mental status - alert, oriented to person, place, and time Eyes - pupils equal and reactive, no scleral icterus, no conjunctival injection Mouth - mucous membranes moist, pharynx normal without lesions Chest - clear to auscultation, no wheezes, rales or rhonchi, symmetric air entry Heart - normal rate, regular  rhythm, normal S1, S2, no murmurs, rubs, clicks or gallops Abdomen - soft, nontender, nondistended, no masses or organomegaly Extremities - peripheral pulses normal, no pedal edema, no clubbing or cyanosis Skin - normal coloration and turgor, no rashes Psych- calm and cooperative, normal mood  ED Course  Procedures (including critical care time)  5:12 PM d/w ACT team- they will see patient and evaluate for detox  Labs Reviewed  COMPREHENSIVE METABOLIC PANEL - Abnormal; Notable for the following:    Glucose, Bld 110 (*)    BUN 25 (*)    Creatinine, Ser 1.43 (*)    AST 47 (*)    GFR calc non Af Amer 50 (*)    GFR calc Af Amer 58 (*)    All other components within normal limits  URINE RAPID DRUG SCREEN (HOSP PERFORMED) - Abnormal; Notable for the following:    Opiates POSITIVE (*)    All other components within normal limits  ETHANOL - Abnormal; Notable for the following:    Alcohol, Ethyl (B) 78 (*)    All other components within normal limits  CBC WITH DIFFERENTIAL  URINALYSIS, ROUTINE W REFLEX MICROSCOPIC   No results found.   1. Alcohol abuse   2. Renal insufficiency       MDM  Pt presenting with c/o requesting alcohol detox.  Psych holding orders written and ACT will evaluate.  Pt with mild renal insufficiency, given IV fluids.          Ethelda Chick,  MD 01/24/13 1512

## 2013-01-23 NOTE — ED Notes (Signed)
Patient is requesting detox from alcohol. Patient drank liquor prior to coming to the ED. Patient states he took a hydrocodone prior to coming tot he ED for his gout pain. Patient denies SI/HI or hallucinations.

## 2013-01-23 NOTE — Progress Notes (Signed)
pcp is Dr Renaye Rakers EPIC updated

## 2013-01-23 NOTE — ED Notes (Signed)
Pt states that he is here for ETOH detox and detox from hydrocodone. Pt is cooperative and calm at this time. Pt states he last had tx 3 years ago for detox at [facility].

## 2013-01-24 ENCOUNTER — Encounter (HOSPITAL_COMMUNITY): Payer: Self-pay | Admitting: *Deleted

## 2013-01-24 ENCOUNTER — Inpatient Hospital Stay (HOSPITAL_COMMUNITY)
Admission: EM | Admit: 2013-01-24 | Discharge: 2013-01-30 | DRG: 897 | Disposition: A | Discharge status: Home / Self Care | Payer: 59 | Source: Intra-hospital | Attending: Psychiatry | Admitting: Psychiatry

## 2013-01-24 DIAGNOSIS — E119 Type 2 diabetes mellitus without complications: Secondary | ICD-10-CM | POA: Diagnosis present

## 2013-01-24 DIAGNOSIS — F1020 Alcohol dependence, uncomplicated: Secondary | ICD-10-CM | POA: Diagnosis present

## 2013-01-24 DIAGNOSIS — I10 Essential (primary) hypertension: Secondary | ICD-10-CM | POA: Diagnosis present

## 2013-01-24 DIAGNOSIS — K219 Gastro-esophageal reflux disease without esophagitis: Secondary | ICD-10-CM | POA: Diagnosis present

## 2013-01-24 DIAGNOSIS — F10939 Alcohol use, unspecified with withdrawal, unspecified: Principal | ICD-10-CM | POA: Diagnosis present

## 2013-01-24 DIAGNOSIS — F411 Generalized anxiety disorder: Secondary | ICD-10-CM | POA: Diagnosis present

## 2013-01-24 DIAGNOSIS — Z79899 Other long term (current) drug therapy: Secondary | ICD-10-CM

## 2013-01-24 DIAGNOSIS — F10239 Alcohol dependence with withdrawal, unspecified: Principal | ICD-10-CM | POA: Diagnosis present

## 2013-01-24 HISTORY — DX: Gastro-esophageal reflux disease without esophagitis: K21.9

## 2013-01-24 HISTORY — DX: Thyrotoxicosis, unspecified without thyrotoxic crisis or storm: E05.90

## 2013-01-24 HISTORY — DX: Essential (primary) hypertension: I10

## 2013-01-24 HISTORY — DX: Type 2 diabetes mellitus without complications: E11.9

## 2013-01-24 LAB — URINALYSIS, ROUTINE W REFLEX MICROSCOPIC
Nitrite: NEGATIVE
Protein, ur: NEGATIVE mg/dL
Specific Gravity, Urine: 1.018 (ref 1.005–1.030)
Urobilinogen, UA: 0.2 mg/dL (ref 0.0–1.0)

## 2013-01-24 LAB — GLUCOSE, CAPILLARY: Glucose-Capillary: 122 mg/dL — ABNORMAL HIGH (ref 70–99)

## 2013-01-24 MED ORDER — IRBESARTAN 300 MG PO TABS
300.0000 mg | ORAL_TABLET | Freq: Every day | ORAL | Status: DC
  Administered 2013-01-24 – 2013-01-29 (×5): 300 mg via ORAL
  Filled 2013-01-24 (×10): qty 1

## 2013-01-24 MED ORDER — CHLORDIAZEPOXIDE HCL 25 MG PO CAPS
25.0000 mg | ORAL_CAPSULE | ORAL | Status: AC
  Administered 2013-01-27 (×2): 25 mg via ORAL
  Filled 2013-01-24 (×2): qty 1

## 2013-01-24 MED ORDER — HYDROXYZINE HCL 25 MG PO TABS
25.0000 mg | ORAL_TABLET | Freq: Four times a day (QID) | ORAL | Status: AC | PRN
  Administered 2013-01-26: 25 mg via ORAL

## 2013-01-24 MED ORDER — NICOTINE 21 MG/24HR TD PT24
21.0000 mg | MEDICATED_PATCH | Freq: Every day | TRANSDERMAL | Status: DC
  Filled 2013-01-24: qty 1

## 2013-01-24 MED ORDER — ALUM & MAG HYDROXIDE-SIMETH 200-200-20 MG/5ML PO SUSP: 30.0000 mL | ORAL | Status: DC | PRN

## 2013-01-24 MED ORDER — CHLORDIAZEPOXIDE HCL 25 MG PO CAPS: 25.0000 mg | ORAL_CAPSULE | Freq: Four times a day (QID) | ORAL | Status: AC | PRN

## 2013-01-24 MED ORDER — AMLODIPINE BESYLATE 10 MG PO TABS
10.0000 mg | ORAL_TABLET | Freq: Every day | ORAL | Status: DC
  Filled 2013-01-24: qty 1

## 2013-01-24 MED ORDER — PANTOPRAZOLE SODIUM 40 MG PO TBEC
40.0000 mg | DELAYED_RELEASE_TABLET | Freq: Every day | ORAL | Status: DC
  Administered 2013-01-25 – 2013-01-29 (×5): 40 mg via ORAL
  Filled 2013-01-24 (×8): qty 1

## 2013-01-24 MED ORDER — AMLODIPINE BESYLATE 10 MG PO TABS
10.0000 mg | ORAL_TABLET | Freq: Every day | ORAL | Status: DC
  Administered 2013-01-24 – 2013-01-29 (×6): 10 mg via ORAL
  Filled 2013-01-24: qty 1
  Filled 2013-01-24: qty 2
  Filled 2013-01-24 (×7): qty 1

## 2013-01-24 MED ORDER — METFORMIN HCL ER 500 MG PO TB24
1000.0000 mg | ORAL_TABLET | Freq: Every day | ORAL | Status: DC
  Administered 2013-01-24 – 2013-01-29 (×6): 1000 mg via ORAL
  Filled 2013-01-24 (×9): qty 2

## 2013-01-24 MED ORDER — CHLORDIAZEPOXIDE HCL 25 MG PO CAPS
25.0000 mg | ORAL_CAPSULE | Freq: Three times a day (TID) | ORAL | Status: AC
  Administered 2013-01-26 (×3): 25 mg via ORAL
  Filled 2013-01-24 (×3): qty 1

## 2013-01-24 MED ORDER — INSULIN ASPART 100 UNIT/ML ~~LOC~~ SOLN: 0.0000 [IU] | Freq: Three times a day (TID) | SUBCUTANEOUS | Status: DC

## 2013-01-24 MED ORDER — MAGNESIUM HYDROXIDE 400 MG/5ML PO SUSP: 30.0000 mL | Freq: Every day | ORAL | Status: DC | PRN

## 2013-01-24 MED ORDER — IRBESARTAN 300 MG PO TABS
300.0000 mg | ORAL_TABLET | Freq: Every day | ORAL | Status: DC
  Filled 2013-01-24: qty 1

## 2013-01-24 MED ORDER — ONDANSETRON 4 MG PO TBDP: 4.0000 mg | ORAL_TABLET | Freq: Four times a day (QID) | ORAL | Status: AC | PRN

## 2013-01-24 MED ORDER — HYDROCHLOROTHIAZIDE 12.5 MG PO CAPS
12.5000 mg | ORAL_CAPSULE | Freq: Every day | ORAL | Status: DC
  Administered 2013-01-24 – 2013-01-29 (×6): 12.5 mg via ORAL
  Filled 2013-01-24 (×9): qty 1

## 2013-01-24 MED ORDER — CHLORDIAZEPOXIDE HCL 25 MG PO CAPS
50.0000 mg | ORAL_CAPSULE | Freq: Once | ORAL | Status: AC
  Administered 2013-01-24: 50 mg via ORAL
  Filled 2013-01-24: qty 2

## 2013-01-24 MED ORDER — VITAMIN B-1 100 MG PO TABS
100.0000 mg | ORAL_TABLET | Freq: Every day | ORAL | Status: DC
  Administered 2013-01-25 – 2013-01-30 (×6): 100 mg via ORAL
  Filled 2013-01-24 (×8): qty 1

## 2013-01-24 MED ORDER — LEVOTHYROXINE SODIUM 100 MCG PO TABS
100.0000 ug | ORAL_TABLET | Freq: Every day | ORAL | Status: DC
  Administered 2013-01-25 – 2013-01-30 (×6): 100 ug via ORAL
  Filled 2013-01-24: qty 2
  Filled 2013-01-24 (×8): qty 1

## 2013-01-24 MED ORDER — CHLORDIAZEPOXIDE HCL 25 MG PO CAPS
25.0000 mg | ORAL_CAPSULE | Freq: Four times a day (QID) | ORAL | Status: AC
  Administered 2013-01-24 – 2013-01-25 (×6): 25 mg via ORAL
  Filled 2013-01-24 (×6): qty 1

## 2013-01-24 MED ORDER — OLMESARTAN-AMLODIPINE-HCTZ 40-10-12.5 MG PO TABS: 1.0000 | ORAL_TABLET | Freq: Every day | ORAL | Status: DC

## 2013-01-24 MED ORDER — ADULT MULTIVITAMIN W/MINERALS CH
1.0000 | ORAL_TABLET | Freq: Every day | ORAL | Status: DC
  Administered 2013-01-24 – 2013-01-30 (×7): 1 via ORAL
  Filled 2013-01-24 (×9): qty 1

## 2013-01-24 MED ORDER — ACETAMINOPHEN 325 MG PO TABS
650.0000 mg | ORAL_TABLET | Freq: Four times a day (QID) | ORAL | Status: DC | PRN
  Administered 2013-01-29: 650 mg via ORAL

## 2013-01-24 MED ORDER — HYDROCHLOROTHIAZIDE 12.5 MG PO CAPS
12.5000 mg | ORAL_CAPSULE | Freq: Every day | ORAL | Status: DC
  Filled 2013-01-24: qty 1

## 2013-01-24 MED ORDER — THIAMINE HCL 100 MG/ML IJ SOLN: 100.0000 mg | Freq: Once | INTRAMUSCULAR | Status: DC

## 2013-01-24 MED ORDER — CHLORDIAZEPOXIDE HCL 25 MG PO CAPS
25.0000 mg | ORAL_CAPSULE | Freq: Every day | ORAL | Status: AC
  Administered 2013-01-28: 25 mg via ORAL
  Filled 2013-01-24: qty 1

## 2013-01-24 MED ORDER — LOPERAMIDE HCL 2 MG PO CAPS: 2.0000 mg | ORAL_CAPSULE | ORAL | Status: AC | PRN

## 2013-01-24 NOTE — ED Provider Notes (Addendum)
Patient presents for detox off of alcohol. He does not appear to be in severe withdrawal at this time. He has mild renal insufficiency with a creatinine of 1.4. This appears to be above her normal baseline. Urinalysis will be added, however this would not delay placement. His renal sufficiency can be worked up as an outpatient and does not require inpatient treatment at this time. He appears to be medically cleared.  Juliet Rude. Rubin Payor, MD 01/24/13 1610  Juliet Rude Rubin Payor, MD 01/24/13 [phone]  Patient accepted at [facility], Dr Sonia Baller R. Rubin Payor, MD 01/24/13 [phone]

## 2013-01-24 NOTE — Tx Team (Signed)
Initial Interdisciplinary Treatment Plan  PATIENT STRENGTHS: (choose at least two) Average or above average intelligence Capable of independent living Communication skills Financial means Motivation for treatment/growth Physical Health  PATIENT STRESSORS: Medication change or noncompliance Substance abuse   PROBLEM LIST: Problem List/Patient Goals Date to be addressed Date deferred Reason deferred Estimated date of resolution  Suicidal ideation 01/24/2013   D/c        Depression 01/24/2013   D/c        Substance abuse 01/24/2013   D/c                           DISCHARGE CRITERIA:  Ability to meet basic life and health needs Adequate post-discharge living arrangements Improved stabilization in mood, thinking, and/or behavior Medical problems require only outpatient monitoring Motivation to continue treatment in a less acute level of care Need for constant or close observation no longer present Reduction of life-threatening or endangering symptoms to within safe limits Safe-care adequate arrangements made Verbal commitment to aftercare and medication compliance Withdrawal symptoms are absent or subacute and managed without 24-hour nursing intervention  PRELIMINARY DISCHARGE PLAN: Attend aftercare/continuing care group Attend PHP/IOP Attend 12-step recovery group Outpatient therapy Return to previous living arrangement  PATIENT/FAMIILY INVOLVEMENT: This treatment plan has been presented to and reviewed with the patient, Sigurd Sos.  The patient and family have been given the opportunity to ask questions and make suggestions.  Earline Mayotte 01/24/2013, 6:27 PM

## 2013-01-24 NOTE — Progress Notes (Signed)
Recreation Therapy Notes   Date: 02.25.2014 Time: 3:00pm Location: [address] Day Room      Group Topic/Focus: Goal Setting  Participation Level: Active  Participation Quality: Appropriate  Affect: Appropriate  Cognitive: Appropriate  Additional Comments: Patient created "Goal Footsteps" Patient given a worksheet with the outline of a foot on it. Patient listed two goals he wants to accomplish on the sole of the foot and obstacles that he might encounter in the toes of the foot. Patient contributed to wrap up discussion about the importance of setting personal goals.    Marykay Lex Whitley Strycharz, LRT/CTRS   Danen Lapaglia L 01/24/2013 4:07 PM

## 2013-01-24 NOTE — ED Notes (Signed)
Pt report given to RN in psych ED. Will transfer rooms when pt is done washing up.

## 2013-01-24 NOTE — Progress Notes (Signed)
On assessment, pt was in his bed, awake.  Pt reports he just got on the unit.  He is here for alcohol detox.  He also told ED staff that he was "using too much vicodin".  Pt reports he is ok right now.  He feels safe here and is not having any significant withdrawal symptoms as of yet.  He denies SI/HI/AV at this time.  He is unsure if he wants to go for any treatment beyond [facility].  He is pleasant/cooperative.  Pt was encouraged to make his needs known to staff.  Pt voiced understanding.  Support/encouragement given.  Safety maintained with q15 minute checks.

## 2013-01-24 NOTE — [facility] Counselor (Signed)
Patient accepted to [facility], Room 307-2. The accepting physician is Celso Amy, [place] to Dr. Geoffery Lyons. Support paperwork completed and faxed to [facility].

## 2013-01-24 NOTE — Progress Notes (Addendum)
Patient second admission to [facility], voluntary, last admission approximately 3 years ago.  Patient has retired from [employer].  Lives in his mobile home in [place], friends come by to visit him and they start drinking.  Stated he just gets bored.  Stated he drinks approximately one fifth daily.   Stated he also takes too many hydrocodone daily, then stated he does not take many hydrocodone.  Denied using any other drugs or smoking.   Friend came to his home and brought him to [facility] ED.  "I never pass out.  I just drink too much.  My friends encourage me to drink."   Healed scars and bruises on lower arms.  Stated he was sober 2-3 years, and then started drinking alcohol again.  Never married, no children.  Denied any abuse as child or adult.  Takes his CBG nightly, no insulin.   Takes metformin for diabetes.  Denied SI and HI.   Denied A/V hallucinations.   Denied pain.  PCP is Dr. Young Berry at [facility].  History of gout/arthritis.  Patient cooperative and pleasant. [address] has cell phone, belt $35.00 cash, wallet, keys, cap, 7 pills in aluminum foil, pt stated these are his home medications.   Fall information sheet discussed, signed and given to patient. Food and drink given to patient who was oriented to unit.

## 2013-01-25 ENCOUNTER — Encounter (HOSPITAL_COMMUNITY): Payer: Self-pay | Admitting: Psychiatry

## 2013-01-25 DIAGNOSIS — F341 Dysthymic disorder: Secondary | ICD-10-CM

## 2013-01-25 DIAGNOSIS — F10239 Alcohol dependence with withdrawal, unspecified: Principal | ICD-10-CM | POA: Diagnosis present

## 2013-01-25 DIAGNOSIS — F411 Generalized anxiety disorder: Secondary | ICD-10-CM | POA: Diagnosis present

## 2013-01-25 DIAGNOSIS — F1020 Alcohol dependence, uncomplicated: Secondary | ICD-10-CM | POA: Diagnosis present

## 2013-01-25 LAB — HEMOGLOBIN A1C
Hgb A1c MFr Bld: 5.9 % — ABNORMAL HIGH (ref <5.7)
Mean Plasma Glucose: 123 mg/dL — ABNORMAL HIGH (ref <117)

## 2013-01-25 MED ORDER — GABAPENTIN 100 MG PO CAPS
100.0000 mg | ORAL_CAPSULE | Freq: Three times a day (TID) | ORAL | Status: DC
  Administered 2013-01-25 – 2013-01-30 (×15): 100 mg via ORAL
  Filled 2013-01-25 (×9): qty 1
  Filled 2013-01-25: qty 12
  Filled 2013-01-25 (×2): qty 1
  Filled 2013-01-25: qty 12
  Filled 2013-01-25 (×2): qty 1
  Filled 2013-01-25: qty 12
  Filled 2013-01-25 (×5): qty 1

## 2013-01-25 NOTE — H&P (Signed)
Psychiatric Admission Assessment Adult  Patient Identification:  Kevin [name] Date of Evaluation:  01/25/2013 Chief Complaint:  Alcohol Abuse Opiate abuse History of Present Illness:: Retired, started having a drink with the boys, then increased to fifth between a day and a night. No particular reason why the relapse "cant blame anyone." "My main problem, stay nervous, (had panic attacks.)" Sister died 6 months ago, cancer. Has had panic starting when he was working and went inside the plant. 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 Wants to quit, address the anxiety, and be back to work. Elements:  Location:  in patient. Quality:  unable to function. Severity:  moderate to severe. Timing:  every day. Duration:  few months. Context:  alcohol dependence, actively drinking, unable to work his tow truck bussines because of it. Associated Signs/Synptoms: Depression Symptoms:  depressed mood, anxiety, panic attacks, loss of energy/fatigue, decreased appetite, (Hypo) Manic Symptoms:  Denies Anxiety Symptoms:  Excessive Worry, Panic Symptoms, Psychotic Symptoms:  Denies PTSD Symptoms:Denies   Psychiatric Specialty Exam: Physical Exam  ROS  Blood pressure 132/75, pulse 77, temperature 97.9 F (36.6 C), temperature source Oral, resp. rate 20, height 5\' 11"  (1.803 m), weight 106.142 kg (234 lb).Body mass index is 32.65 kg/(m^2).  General Appearance: Fairly Groomed  Patent attorney::  Fair  Speech:  Clear and Coherent and not spontaneous  Volume:  Normal  Mood:  Anxious, Depressed and worried  Affect:  Restricted  Thought Process:  Coherent and Goal Directed  Orientation:  Full (Time, Place, and Person)  Thought Content:  worries, concerns  Suicidal Thoughts:  No  Homicidal Thoughts:  No  Memory:  Immediate;   Fair Recent;   Fair Remote;   Fair  Judgement:  Fair  Insight:  Present  Psychomotor Activity:  Restlessness  Concentration:  Fair   Recall:  Fair  Akathisia:  No  Handed:  Right  AIMS (if indicated):     Assets:  Desire for Improvement Housing Social Support Transportation  Sleep:  Number of Hours: 5    Past Psychiatric History: Diagnosis: Alcohol Dependence  Hospitalizations: [facility]  Outpatient Care: Not currently  Substance Abuse Care:   Self-Mutilation: Denies  Suicidal Attempts:Denies  Violent Behaviors:Denies   Past Medical History:   Past Medical History  Diagnosis Date  . Arthritis   . Gout   . Alcohol abuse   . Hypertension   . Hyperthyroidism   . Diabetes mellitus without complication   . GERD (gastroesophageal reflux disease)     Allergies:  No Known Allergies PTA Medications: Prescriptions prior to admission  Medication Sig Dispense Refill  . lansoprazole (PREVACID) 30 MG capsule Take 30 mg by mouth daily.      Marland Kitchen levothyroxine (SYNTHROID, LEVOTHROID) 100 MCG tablet Take 100 mcg by mouth daily.      . metFORMIN (GLUMETZA) 1000 MG (MOD) 24 hr tablet Take 1,000 mg by mouth daily with breakfast.      . Olmesartan-Amlodipine-HCTZ (TRIBENZOR) 40-10-12.5 MG TABS Take 1 tablet by mouth daily.       . vardenafil (LEVITRA) 10 MG tablet Take 10 mg by mouth daily as needed for erectile dysfunction. Erectile dysfunction        Previous Psychotropic Medications:  Medication/Dose  No antidepressants               Substance Abuse History in the last 12 months:  yes  Consequences of Substance Abuse: Withdrawal Symptoms:   denies  Social  History:  reports that he has never smoked. He has never used smokeless tobacco. He reports that  drinks alcohol. He reports that he does not use illicit drugs. Additional Social History: Pain Medications: hydrocodone Prescriptions: metformin Over the Counter: none History of alcohol / drug use?: Yes Longest period of sobriety (when/how long): 3 years sober, no problems Negative Consequences of Use:  (denied problems other than alcohol  abuse) Withdrawal Symptoms: Other (Comment) (no symptoms/problems at this time) Name of Substance 1: alcohol 1 - Age of First Use: 18 years  1 - Amount (size/oz): one fifth daily 1 - Frequency: daily 1 - Duration: 40 years 1 - Last Use / Amount: one fifth daily 01/22/2013 Name of Substance 2: hydrocodone 2 - Age of First Use: 42 years 2 - Amount (size/oz): unsure 2 - Frequency: several times weekly 2 - Duration: 20 yrs 2 - Last Use / Amount: past week                Current Place of Residence:   Place of Birth:   Family Members: Marital Status:  Single Children:  Sons:  Daughters: Relationships: Education:  [employer] Problems/Performance: Religious Beliefs/Practices: History of Abuse (Emotional/Phsycial/Sexual) Occupational Experiences; [employer] years Military History:  None. Legal History: Hobbies/Interests:  Family History:  History reviewed. No pertinent family history.  Results for orders placed during the hospital encounter of 01/24/13 (from the past 72 hour(s))  HEMOGLOBIN A1C     Status: Abnormal   Collection Time    01/24/13  7:45 PM      Result Value Range   Hemoglobin A1C 5.9 (*) <5.7 %   Comment: (NOTE)                                                                               According to the ADA Clinical Practice Recommendations for 2011, when     HbA1c is used as a screening test:      >=6.5%   Diagnostic of Diabetes Mellitus               (if abnormal result is confirmed)     5.7-6.4%   Increased risk of developing Diabetes Mellitus     References:Diagnosis and Classification of Diabetes Mellitus,Diabetes     Care,2011,34(Suppl 1):S62-S69 and Standards of Medical Care in             Diabetes - 2011,Diabetes Care,2011,34 (Suppl 1):S11-S61.   Mean Plasma Glucose 123 (*) <117 mg/dL  GLUCOSE, CAPILLARY     Status: Abnormal   Collection Time    01/24/13  9:29 PM      Result Value Range   Glucose-Capillary 122 (*) 70 - 99  mg/dL   Psychological Evaluations:  Assessment:   AXIS I:  Alcohol Dependence, Anxiety Disorder NOS, Depressive Disorder NOS AXIS II:  Deferred AXIS III:   Past Medical History  Diagnosis Date  . Arthritis   . Gout   . Alcohol abuse   . Hypertension   . Hyperthyroidism   . Diabetes mellitus without complication   . GERD (gastroesophageal reflux disease)    AXIS IV:  other psychosocial or environmental problems AXIS V:  51-60 moderate symptoms  Treatment Plan/Recommendations:  Supportive approach/coping skills/relapse prevention                                                                   Detox                                                                 Address the co morbidities  Treatment Plan Summary: Daily contact with patient to assess and evaluate symptoms and progress in treatment Medication management Current Medications:  Current Facility-Administered Medications  Medication Dose Route Frequency Provider Last Rate Last Dose  . acetaminophen (TYLENOL) tablet 650 mg  650 mg Oral Q6H PRN Sanjuana Kava, NP      . alum & mag hydroxide-simeth (MAALOX/MYLANTA) 200-200-20 MG/5ML suspension 30 mL  30 mL Oral Q4H PRN Sanjuana Kava, NP      . irbesartan (AVAPRO) tablet 300 mg  300 mg Oral QHS Kerry Hough, PA   300 mg at 01/24/13 2302   And  . amLODipine (NORVASC) tablet 10 mg  10 mg Oral QHS Kerry Hough, PA   10 mg at 01/24/13 2302   And  . hydrochlorothiazide (MICROZIDE) capsule 12.5 mg  12.5 mg Oral QHS Kerry Hough, PA   12.5 mg at 01/24/13 2303  . chlordiazePOXIDE (LIBRIUM) capsule 25 mg  25 mg Oral Q6H PRN Sanjuana Kava, NP      . chlordiazePOXIDE (LIBRIUM) capsule 25 mg  25 mg Oral QID Sanjuana Kava, NP   25 mg at 01/25/13 0847   Followed by  . [START ON 01/26/2013] chlordiazePOXIDE (LIBRIUM) capsule 25 mg  25 mg Oral TID Sanjuana Kava, NP       Followed by  . [START ON 01/27/2013] chlordiazePOXIDE (LIBRIUM) capsule 25 mg  25 mg Oral BH-qamhs Sanjuana Kava, NP       Followed by  . [START ON 01/28/2013] chlordiazePOXIDE (LIBRIUM) capsule 25 mg  25 mg Oral Daily Sanjuana Kava, NP      . hydrOXYzine (ATARAX/VISTARIL) tablet 25 mg  25 mg Oral Q6H PRN Sanjuana Kava, NP      . levothyroxine (SYNTHROID, LEVOTHROID) tablet 100 mcg  100 mcg Oral QAC breakfast Sanjuana Kava, NP   100 mcg at 01/25/13 0630  . loperamide (IMODIUM) capsule 2-4 mg  2-4 mg Oral PRN Sanjuana Kava, NP      . magnesium hydroxide (MILK OF MAGNESIA) suspension 30 mL  30 mL Oral Daily PRN Sanjuana Kava, NP      . metFORMIN (GLUCOPHAGE-XR) 24 hr tablet 1,000 mg  1,000 mg Oral QHS Sanjuana Kava, NP   1,000 mg at 01/24/13 2302  . multivitamin with minerals tablet 1 tablet  1 tablet Oral Daily Sanjuana Kava, NP   1 tablet at 01/25/13 0847  . ondansetron (ZOFRAN-ODT) disintegrating tablet 4 mg  4 mg Oral Q6H PRN Sanjuana Kava, NP      . pantoprazole (PROTONIX) EC tablet 40 mg  40 mg Oral Daily Nicole Kindred  I Nwoko, NP   40 mg at 01/25/13 0847  . thiamine (B-1) injection 100 mg  100 mg Intramuscular Once Sanjuana Kava, NP      . thiamine (VITAMIN B-1) tablet 100 mg  100 mg Oral Daily Sanjuana Kava, NP   100 mg at 01/25/13 0847    Observation Level/Precautions:  Detox 15 minute checks  Laboratory:  As per the ED  Psychotherapy:  Individual/group  Medications:  Librium Detox/reassess co morbidities  Consultations:    Discharge Concerns:    Estimated LOS: 5-7 days  Other:     I certify that inpatient services furnished can reasonably be expected to improve the patient's condition.   Jedadiah Abdallah A 2/26/201411:25 AM

## 2013-01-25 NOTE — Tx Team (Signed)
Interdisciplinary Treatment Plan Update (Adult)  Date: 01/25/2013  Time Reviewed: 9:36 AM   Progress in Treatment: Attending groups: Yes Participating in groups: Yes Taking medication as prescribed:  Yes Tolerating medication:  Yes Family/Significant othe contact made: No Patient understands diagnosis: Yes Discussing patient identified problems/goals with staff: Yes Medical problems stabilized or resolved:  Yes Denies suicidal/homicidal ideation: Yes Patient has not harmed self or Others: Yes  New problem(s) identified: None Identified  Discharge Plan or Barriers:  CSW is assessing for appropriate referrals.   Additional comments: N/A  Reason for Continuation of Hospitalization: Medication stabilization Withdrawal symptoms   Estimated length of stay: 3-5 days  For review of initial/current patient goals, please see plan of care.  Attendees: Patient:     Other:  Dolora Sutton, Trans Care Coordinator 01/25/2013 9:36 AM  Physician:  Irving Lugo 01/25/2013 9:36 AM   Nursing:   Vivian Kent, RN 01/25/2013 9:36 AM   Clinical Social Worker Catherine Harrill 01/25/2013 9:36 AM   Other:  Donna Shimp, RN 01/25/2013 9:36 AM   Other:  Angus Nwoko, PA 01/25/2013 9:36 AM   Other:  Tessa Conte, [place] PA 01/25/2013 9:36 AM   Other:  Ariana Hoet, Psych Intern 01/25/2013 9:36 AM    Scribe for Treatment Team:   Catherine C Harrill, LCSWA  01/25/2013 9:36 AM 

## 2013-01-25 NOTE — Progress Notes (Signed)
Patient ID: Kevin [name], male   DOB: [birth year], 67 y.o.   MRN: [id number] He has been up and to groups interacting with peers and staff. Self inventory: Depression 6, hopelessness 5,  Withdrawals,  tremors agitation, and denies SI thoughts. This pm he has c/o gout pain and there there is new order   . Pt had refused offer of tylenol. Stated that it would not help.

## 2013-01-25 NOTE — Progress Notes (Signed)
[facility] LCSW Aftercare Discharge Planning Group Note  01/25/2013 8:35 AM  Participation Quality:  Appropriate  Affect:  Appropriate  Cognitive:  Alert and Oriented  Insight:  Engaged  Engagement in Group:  Engaged  Modes of Intervention:  Clarification, Exploration, Orientation, Socialization and Support  Summary of Progress/Problems: Pt denies suicidal ideation and homicidal ideation.  On a scale of 1 to 10 with ten being the most ever experienced, the patient rates depression at a 4 and anxiety at a 2. Kevin [name] reports that since he was here in 2012 he "stayed sober until sister passed at which time I began to take a nip here and there." The alcohol use increased to point he has been drinking daily for last 6 months.  Patient reports he is "seeking help before things get worse."    Clide Dales 01/25/2013, 8:40 AM

## 2013-01-25 NOTE — [facility] Suicide Risk Assessment (Signed)
Suicide Risk Assessment  Admission Assessment     Nursing information obtained from:  Patient Demographic factors:  Male;Living alone Current Mental Status:    Loss Factors:    Historical Factors:  Family history of mental illness or substance abuse Risk Reduction Factors:     CLINICAL FACTORS:   Severe Anxiety and/or Agitation Alcohol/Substance Abuse/Dependencies  COGNITIVE FEATURES THAT CONTRIBUTE TO RISK: No evidence   SUICIDE RISK:   Mild:  Suicidal ideation of limited frequency, intensity, duration, and specificity.  There are no identifiable plans, no associated intent, mild dysphoria and related symptoms, good self-control (both objective and subjective assessment), few other risk factors, and identifiable protective factors, including available and accessible social support.  PLAN OF CARE: Supportive approach/coping skills/relapse prevention                               Librium Detox protocol                               Reassess for treatment for his anxiety  I certify that inpatient services furnished can reasonably be expected to improve the patient's condition.  Tyrell Brereton A 01/25/2013, 12:43 PM

## 2013-01-25 NOTE — Progress Notes (Signed)
[facility] LCSW Group Therapy  01/25/2013 1:15 PM  Type of Therapy:  Group Therapy 1:15 to 2:30  Participation Level:  Apprpriate  Participation Quality:  Appropriate  Affect:  Appropriate  Cognitive:   Appropriate   Insight: Good  Engagement in Therapy:  Engaged  Modes of Intervention:  Activity, Discussion, Exploration, Orientation and Support  Summary of Progress/Problems: Group members participated in activity in which patients choose photographs to represent what their life would look and feel like were it in balance and another for out of balance. Group members were able to process how daily decisions can affect the direction we are headed towards.  Kevin [name] shared how his addiction keeps him prisoner and he "sees recovery as a chance a new life, like spring is coming."   Kevin [name], Kevin [name]

## 2013-01-26 DIAGNOSIS — F10239 Alcohol dependence with withdrawal, unspecified: Principal | ICD-10-CM

## 2013-01-26 DIAGNOSIS — F411 Generalized anxiety disorder: Secondary | ICD-10-CM

## 2013-01-26 LAB — GLUCOSE, CAPILLARY: Glucose-Capillary: 126 mg/dL — ABNORMAL HIGH (ref 70–99)

## 2013-01-26 MED ORDER — COLCHICINE 0.6 MG PO TABS
0.6000 mg | ORAL_TABLET | Freq: Every day | ORAL | Status: DC
  Administered 2013-01-26 – 2013-01-30 (×5): 0.6 mg via ORAL
  Filled 2013-01-26 (×3): qty 1
  Filled 2013-01-26: qty 4
  Filled 2013-01-26 (×3): qty 1

## 2013-01-26 NOTE — Progress Notes (Addendum)
Recreation Therapy Notes   Date: 02.27.214 Time: 3:00pm Location: [address] Day Room      Group Topic/Focus: Leisure Education  Participation Level: Active  Participation Quality: Appropriate  Affect: Eutymic  Cognitive: Appropriate   Additional Comments: Patient created Leisure Alphabet. Patient named at least one leisure or recreation activity for each letter of the alphabet. Patient with peers created group leisure alphabet. Patient activity participated in group discussion about making healthy leisure and recreation choices.   Marykay Lex Raffi Milstein, LRT/CTRS    Jearl Klinefelter 01/26/2013 3:46 PM

## 2013-01-26 NOTE — Progress Notes (Signed)
D: Patient in bed awake on approach.  Patient states everyday is getting better.  Patient states he still has anxiety and depression but states that they are low.  Patient states [facility] is the best place he can be for him to get help.  Patient denies SI/HI and denies AVH. A: Staff to monitor Q 15 mins for safety.  Encouragement and support offered.  Scheduled medications administered per orders. R: Patient remains safe on the unit.  Patient did not attend group tonight.  Patient calm and pleasant.  Patient isolative and in his room tonight.  Patient taking administered medications

## 2013-01-26 NOTE — Progress Notes (Signed)
Pt reports he is doing ok this evening.  He states he is having minimal withdrawal symptoms at this point.  He has attended groups today.  He denies SI/HI/AV at this time.  He is pleasant/cooperative.  Pt makes his needs known to staff.  He intends to return to his home after discharge from [facility].  Support and encouragement given.  Safety maintained with q15 minute checks.

## 2013-01-26 NOTE — Progress Notes (Signed)
D:  Patient up and active in the milieu.  Has been attending groups and interacting with peers.  Has been tolerating his medications.  He rates his depression at 5 and hopelessness at 3.  He denies suicidal or self harm thoughts.  His appetite and sleep are both good.   A:  Medications given as ordered.  Encouraged participation in all groups.   R:  Pleasant and cooperative.  Interacting well with staff and peers.  States that the gabapentin is helping his gout pain.  Patient remains safe on the unit at this time.

## 2013-01-26 NOTE — Progress Notes (Signed)
Adult Psychoeducational Group Note  Date:  01/26/2013 Time:  6:49 PM  Group Topic/Focus:  Overcoming Stress:   The focus of this group is to define stress and help patients assess their triggers.  Participation Level:  Active  Participation Quality:  Appropriate, Attentive and Sharing  Affect:  Appropriate  Cognitive:  Appropriate  Insight: Appropriate  Engagement in Group:  Engaged  Modes of Intervention:  Stress Interview  Additional Comments:  [name] attended group and shared throughout the group. Patient shared the negative and positive stressors in patient life. Patient completed overcoming stress interview with a peer in the group. After completing interview, patient discussed questions from worksheet and was given information about how to manage stress. Patient was involved in his treatment and discussed information on what he would like to see improvement when discharged.   Kevin [name] Brittini 01/26/2013, 6:49 PM

## 2013-01-26 NOTE — Progress Notes (Signed)
[facility] LCSW Aftercare Discharge Planning Group Note  01/26/2013 8:45 AM  Participation Quality:  Appropriate  Affect:  Appropriate  Cognitive:  Oriented  Insight:  Improving  Engagement in Group:  Developing/Improving  Modes of Intervention:  Clarification, Exploration, Limit-setting and Support  Summary of Progress/Problems:  Pt denies both suicidal and homicidal ideation.  On a scale of 1 to 10 with ten being the most ever experienced, the patient rates depression at a 4 and anxiety at a 2. Daymon reports he slept only for about 4 hours last night.  Pt also reports he is willing to go to AA once he discharges this time.     Clide Dales 01/26/2013, 5:46 PM

## 2013-01-26 NOTE — Progress Notes (Signed)
[facility] MD Progress Note  01/26/2013 12:22 PM Doug Bucklin  MRN:  [id number]  Subjective:  Mr. Michael reports that he came here to get help to start putting his life together. Having been drinking heavily x 5 months, Brick states that he is depressed and disappointed in his life. He says because of his drinking, he allowed his responsibilities to slack. He states that this spring will bring out a new him to start living better. He adds that the Neurontin that he started yesterday seem to have helped with his joint pain. He takes Colcryl for his gouty arthritis.  Diagnosis:   Axis I:  Alcohol dependence, alcohol withdrawal, GAD Axis II: Deferred Axis III:  Past Medical History  Diagnosis Date  . Arthritis   . Gout   . Alcohol abuse   . Hypertension   . Hyperthyroidism   . Diabetes mellitus without complication   . GERD (gastroesophageal reflux disease)    Axis IV: Substance abuse issues Axis V: 51  ADL's:  Intact  Sleep: Fair  Appetite:  Fair  Suicidal Ideation:  Plan:  No Intent:  No Means:  No Homicidal Ideation:  Plan:  No Intent:  No Means:  No  AEB (as evidenced by): Per patient's reports.  Psychiatric Specialty Exam: Review of Systems  Constitutional: Negative.   HENT: Negative.   Eyes: Negative.   Respiratory: Negative.   Cardiovascular: Negative.   Gastrointestinal: Negative.   Genitourinary: Negative.   Musculoskeletal: Positive for myalgias and joint pain.  Skin: Negative.   Neurological: Negative.   Endo/Heme/Allergies: Negative.   Psychiatric/Behavioral: Positive for substance abuse (Alcoholism). Negative for depression, suicidal ideas and memory loss. The patient is nervous/anxious. The patient does not have insomnia.     Blood pressure 130/78, pulse 80, temperature 98.1 F (36.7 C), temperature source Oral, resp. rate 18, height 5\' 11"  (1.803 m), weight 106.142 kg (234 lb).Body mass index is 32.65 kg/(m^2).  General Appearance: Casual  Eye  Contact::  Good  Speech:  Clear and Coherent  Volume:  Normal  Mood:  Depressed, rated #3  Affect:  Congruent  Thought Process:  Coherent and Intact  Orientation:  Full (Time, Place, and Person)  Thought Content:  Rumination  Suicidal Thoughts:  No  Homicidal Thoughts:  No  Memory:  Immediate;   Good Recent;   Good Remote;   Good  Judgement:  Fair  Insight:  Fair  Psychomotor Activity:  "I'm tensed"  Concentration:  Fair  Recall:  Good  Akathisia:  No  Handed:  Right  AIMS (if indicated):     Assets:  Desire for Improvement  Sleep:  Number of Hours: 6   Current Medications: Current Facility-Administered Medications  Medication Dose Route Frequency Provider Last Rate Last Dose  . acetaminophen (TYLENOL) tablet 650 mg  650 mg Oral Q6H PRN Sanjuana Kava, NP      . alum & mag hydroxide-simeth (MAALOX/MYLANTA) 200-200-20 MG/5ML suspension 30 mL  30 mL Oral Q4H PRN Sanjuana Kava, NP      . irbesartan (AVAPRO) tablet 300 mg  300 mg Oral QHS Kerry Hough, PA   300 mg at 01/24/13 2302   And  . amLODipine (NORVASC) tablet 10 mg  10 mg Oral QHS Kerry Hough, PA   10 mg at 01/25/13 2154   And  . hydrochlorothiazide (MICROZIDE) capsule 12.5 mg  12.5 mg Oral QHS Kerry Hough, PA   12.5 mg at 01/25/13 2154  . chlordiazePOXIDE (LIBRIUM) capsule 25  mg  25 mg Oral Q6H PRN Sanjuana Kava, NP      . chlordiazePOXIDE (LIBRIUM) capsule 25 mg  25 mg Oral TID Sanjuana Kava, NP   25 mg at 01/26/13 0816   Followed by  . [START ON 01/27/2013] chlordiazePOXIDE (LIBRIUM) capsule 25 mg  25 mg Oral BH-qamhs Sanjuana Kava, NP       Followed by  . [START ON 01/28/2013] chlordiazePOXIDE (LIBRIUM) capsule 25 mg  25 mg Oral Daily Sanjuana Kava, NP      . gabapentin (NEURONTIN) capsule 100 mg  100 mg Oral TID Sanjuana Kava, NP   100 mg at 01/26/13 0818  . hydrOXYzine (ATARAX/VISTARIL) tablet 25 mg  25 mg Oral Q6H PRN Sanjuana Kava, NP      . levothyroxine (SYNTHROID, LEVOTHROID) tablet 100 mcg  100 mcg  Oral QAC breakfast Sanjuana Kava, NP   100 mcg at 01/26/13 9604  . loperamide (IMODIUM) capsule 2-4 mg  2-4 mg Oral PRN Sanjuana Kava, NP      . magnesium hydroxide (MILK OF MAGNESIA) suspension 30 mL  30 mL Oral Daily PRN Sanjuana Kava, NP      . metFORMIN (GLUCOPHAGE-XR) 24 hr tablet 1,000 mg  1,000 mg Oral QHS Sanjuana Kava, NP   1,000 mg at 01/25/13 2154  . multivitamin with minerals tablet 1 tablet  1 tablet Oral Daily Sanjuana Kava, NP   1 tablet at 01/26/13 0817  . ondansetron (ZOFRAN-ODT) disintegrating tablet 4 mg  4 mg Oral Q6H PRN Sanjuana Kava, NP      . pantoprazole (PROTONIX) EC tablet 40 mg  40 mg Oral Daily Sanjuana Kava, NP   40 mg at 01/26/13 0817  . thiamine (B-1) injection 100 mg  100 mg Intramuscular Once Sanjuana Kava, NP      . thiamine (VITAMIN B-1) tablet 100 mg  100 mg Oral Daily Sanjuana Kava, NP   100 mg at 01/26/13 5409    Lab Results:  Results for orders placed during the hospital encounter of 01/24/13 (from the past 48 hour(s))  HEMOGLOBIN A1C     Status: Abnormal   Collection Time    01/24/13  7:45 PM      Result Value Range   Hemoglobin A1C 5.9 (*) <5.7 %   Comment: (NOTE)                                                                               According to the ADA Clinical Practice Recommendations for 2011, when     HbA1c is used as a screening test:      >=6.5%   Diagnostic of Diabetes Mellitus               (if abnormal result is confirmed)     5.7-6.4%   Increased risk of developing Diabetes Mellitus     References:Diagnosis and Classification of Diabetes Mellitus,Diabetes     Care,2011,34(Suppl 1):S62-S69 and Standards of Medical Care in             Diabetes - 2011,Diabetes Care,2011,34 (Suppl 1):S11-S61.   Mean Plasma Glucose 123 (*) <117 mg/dL  GLUCOSE,  CAPILLARY     Status: Abnormal   Collection Time    01/24/13  9:29 PM      Result Value Range   Glucose-Capillary 122 (*) 70 - 99 mg/dL  GLUCOSE, CAPILLARY     Status: Abnormal    Collection Time    01/25/13  9:22 PM      Result Value Range   Glucose-Capillary 125 (*) 70 - 99 mg/dL    Physical Findings: AIMS: Facial and Oral Movements Muscles of Facial Expression: None, normal Lips and Perioral Area: None, normal Jaw: None, normal Tongue: None, normal,Extremity Movements Upper (arms, wrists, hands, fingers): None, normal Lower (legs, knees, ankles, toes): None, normal, Trunk Movements Neck, shoulders, hips: None, normal, Overall Severity Severity of abnormal movements (highest score from questions above): None, normal Incapacitation due to abnormal movements: None, normal Patient's awareness of abnormal movements (rate only patient's report): No Awareness, Dental Status Current problems with teeth and/or dentures?: No Does patient usually wear dentures?: No  CIWA:  CIWA-Ar Total: 2 COWS:  COWS Total Score: 2  Treatment Plan Summary: Daily contact with patient to assess and evaluate symptoms and progress in treatment Medication management  Plan: Plan:   Supportive approach/coping skills/relapse prevention. Re-initiate Colcryl 0.6 mg daily for symptoms of gout. Encouraged out of room, participation in group sessions and application of coping skills when distressed. Will continue to monitor response to/adverse effects of medications in use to assure effectiveness. Continue to monitor mood, behavior and interaction with staff and other patients. Continue current plan of care.  Medical Decision Making Problem Points:  Established problem, stable/improving (1), New problem, with no additional work-up planned (3), Review of last therapy session (1) and Review of psycho-social stressors (1) Data Points:  Review of medication regiment & side effects (2) Review of new medications or change in dosage (2)  I certify that inpatient services furnished can reasonably be expected to improve the patient's condition.   Armandina Stammer I 01/26/2013, 12:22 PM

## 2013-01-26 NOTE — Progress Notes (Signed)
[facility] LCSW Group Therapy  01/26/2013   Type of Therapy:  Group Therapy 1:15 to 2:30  Participation Level:  Did Not Attend   Kevin [name]

## 2013-01-27 LAB — GLUCOSE, CAPILLARY
Glucose-Capillary: 112 mg/dL — ABNORMAL HIGH (ref 70–99)
Glucose-Capillary: 122 mg/dL — ABNORMAL HIGH (ref 70–99)
Glucose-Capillary: 146 mg/dL — ABNORMAL HIGH (ref 70–99)

## 2013-01-27 MED ORDER — CITALOPRAM HYDROBROMIDE 10 MG PO TABS
10.0000 mg | ORAL_TABLET | Freq: Every day | ORAL | Status: DC
  Administered 2013-01-27 – 2013-01-30 (×4): 10 mg via ORAL
  Filled 2013-01-27 (×5): qty 1
  Filled 2013-01-27: qty 4

## 2013-01-27 NOTE — Progress Notes (Signed)
Kevin [name] is seen [facility] on the 300 hall today. HE denies feeling suicidal, he takes his medications as scheduled and he completes his AM self inventory and on it he   Wrote he rates his depression and hopelessness " 3 / 3" and states his DC plan is to " plan my life different" .   A HE is cooperative, quiet and pleasant with both staff adn patients.   R Safety is in place and POC cont

## 2013-01-27 NOTE — Progress Notes (Signed)
[facility] MD Progress Note  01/27/2013 2:55 PM Kevin [name]  MRN:  [id number] Subjective:  Kevin [name] endorses that he is still experiencing underlying depression, anxiety. He has a strong social anxiety component. He mostly stays at the house, avoids going out. This has created the scenario for "friends" to [name] by and bring alcohol or getting alcohol for him. 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 He has used the Vistaril up to QID, and endorses that it has helped but not as much as he would like for it to do. Concerned about having the anxiety be a factor in his relapse. 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 Arthritis   . Gout   . Alcohol abuse   . Hypertension   . Hyperthyroidism   . Diabetes mellitus without complication   . GERD (gastroesophageal reflux disease)    Axis IV: problems with primary support group Axis V: 51-60 moderate symptoms  ADL's:  Intact  Sleep: Fair  Appetite:  Fair  Suicidal Ideation:  Plan:  Denies Intent:  Denies Means:  Denies Homicidal Ideation:  Plan:  Denies Intent:  Denies Means:  Denies AEB (as evidenced by):  Psychiatric Specialty Exam: Review of Systems  Constitutional: Negative.   HENT: Negative.   Eyes: Negative.   Respiratory: Negative.   Cardiovascular: Negative.   Gastrointestinal: Negative.   Genitourinary: Negative.   Musculoskeletal: Negative.   Skin: Negative.   Neurological: Negative.   Endo/Heme/Allergies: Negative.   Psychiatric/Behavioral: Positive for depression and substance abuse. The patient is nervous/anxious.     Blood pressure 131/80, pulse 79, temperature 97.3 F (36.3 C), temperature source Oral, resp. rate 20, height 5\' 11"  (1.803 m), weight 106.142 kg (234 lb).Body mass index is 32.65 kg/(m^2).  General Appearance: Fairly Groomed  Patent attorney::  Fair  Speech:  Clear  and Coherent and Slow  Volume:  Decreased  Mood:  Anxious and Depressed  Affect:  Restricted  Thought Process:  Coherent and Goal Directed  Orientation:  Full (Time, Place, and Person)  Thought Content:  Rumination and worries  Suicidal Thoughts:  No  Homicidal Thoughts:  No  Memory:  Immediate;   Fair Recent;   Fair Remote;   Fair  Judgement:  Fair  Insight:  superficial  Psychomotor Activity:  Decreased  Concentration:  Fair  Recall:  Fair  Akathisia:  No  Handed:  Right  AIMS (if indicated):     Assets:  Desire for Improvement Housing  Sleep:  Number of Hours: 5.75   Current Medications: Current Facility-Administered Medications  Medication Dose Route Frequency Provider Last Rate Last Dose  . acetaminophen (TYLENOL) tablet 650 mg  650 mg Oral Q6H PRN Sanjuana Kava, NP      . alum & mag hydroxide-simeth (MAALOX/MYLANTA) 200-200-20 MG/5ML suspension 30 mL  30 mL Oral Q4H PRN Sanjuana Kava, NP      . irbesartan (AVAPRO) tablet 300 mg  300 mg Oral QHS Kerry Hough, PA   300 mg at 01/26/13 2149   And  . amLODipine (NORVASC) tablet 10 mg  10 mg Oral QHS Kerry Hough, PA   10 mg at 01/26/13 2149   And  . hydrochlorothiazide (MICROZIDE) capsule 12.5 mg  12.5 mg Oral QHS Kerry Hough, PA   12.5 mg at 01/26/13 2149  . chlordiazePOXIDE (LIBRIUM) capsule 25 mg  25 mg Oral Q6H PRN Sanjuana Kava, NP      . chlordiazePOXIDE (LIBRIUM) capsule 25 mg  25 mg Oral BH-qamhs Sanjuana Kava, NP   25 mg at 01/27/13 0831   Followed by  . [START ON 01/28/2013] chlordiazePOXIDE (LIBRIUM) capsule 25 mg  25 mg Oral Daily Sanjuana Kava, NP      . colchicine tablet 0.6 mg  0.6 mg Oral Daily Sanjuana Kava, NP   0.6 mg at 01/27/13 0831  . gabapentin (NEURONTIN) capsule 100 mg  100 mg Oral TID Sanjuana Kava, NP   100 mg at 01/27/13 1257  . hydrOXYzine (ATARAX/VISTARIL) tablet 25 mg  25 mg Oral Q6H PRN Sanjuana Kava, NP   25 mg at 01/26/13 2303  . levothyroxine (SYNTHROID, LEVOTHROID) tablet 100 mcg   100 mcg Oral QAC breakfast Sanjuana Kava, NP   100 mcg at 01/27/13 0640  . loperamide (IMODIUM) capsule 2-4 mg  2-4 mg Oral PRN Sanjuana Kava, NP      . magnesium hydroxide (MILK OF MAGNESIA) suspension 30 mL  30 mL Oral Daily PRN Sanjuana Kava, NP      . metFORMIN (GLUCOPHAGE-XR) 24 hr tablet 1,000 mg  1,000 mg Oral QHS Sanjuana Kava, NP   1,000 mg at 01/26/13 2149  . multivitamin with minerals tablet 1 tablet  1 tablet Oral Daily Sanjuana Kava, NP   1 tablet at 01/27/13 0831  . ondansetron (ZOFRAN-ODT) disintegrating tablet 4 mg  4 mg Oral Q6H PRN Sanjuana Kava, NP      . pantoprazole (PROTONIX) EC tablet 40 mg  40 mg Oral Daily Sanjuana Kava, NP   40 mg at 01/27/13 1610  . thiamine (B-1) injection 100 mg  100 mg Intramuscular Once Sanjuana Kava, NP      . thiamine (VITAMIN B-1) tablet 100 mg  100 mg Oral Daily Sanjuana Kava, NP   100 mg at 01/27/13 9604    Lab Results:  Results for orders placed during the hospital encounter of 01/24/13 (from the past 48 hour(s))  GLUCOSE, CAPILLARY     Status: Abnormal   Collection Time    01/25/13  9:22 PM      Result Value Range   Glucose-Capillary 125 (*) 70 - 99 mg/dL  GLUCOSE, CAPILLARY     Status: Abnormal   Collection Time    01/26/13  9:41 PM      Result Value Range   Glucose-Capillary 126 (*) 70 - 99 mg/dL   Comment 1 Notify RN     Comment 2 Documented in Chart    GLUCOSE, CAPILLARY     Status: Abnormal   Collection Time    01/27/13  6:06 AM      Result Value Range   Glucose-Capillary 122 (*) 70 - 99 mg/dL  GLUCOSE, CAPILLARY     Status: Abnormal   Collection Time    01/27/13 12:05 PM      Result Value Range   Glucose-Capillary 112 (*) 70 - 99 mg/dL    Physical Findings: AIMS: Facial and Oral Movements Muscles of Facial Expression: None, normal Lips and Perioral Area: None, normal Jaw: None, normal Tongue: None, normal,Extremity Movements Upper (arms, wrists, hands, fingers): None, normal Lower (legs, knees, ankles, toes):  None, normal, Trunk Movements Neck, shoulders, hips: None, normal, Overall Severity Severity of abnormal movements (highest score from questions above): None, normal Incapacitation due to abnormal movements: None, normal Patient's awareness of  abnormal movements (rate only patient's report): No Awareness, Dental Status Current problems with teeth and/or dentures?: No Does patient usually wear dentures?: No  CIWA:  CIWA-Ar Total: 0 COWS:  COWS Total Score: 2  Treatment Plan Summary: Daily contact with patient to assess and evaluate symptoms and progress in treatment Medication management  Plan: Supportive approach/coping skills/relapse prevention           Trial with Celexa 10 mg daily  Medical Decision Making Problem Points:  Review of last therapy session (1) and Review of psycho-social stressors (1) Data Points:  Review of medication regiment & side effects (2) Review of new medications or change in dosage (2)  I certify that inpatient services furnished can reasonably be expected to improve the patient's condition.   Aleicia Kenagy A 01/27/2013, 2:55 PM

## 2013-01-27 NOTE — Progress Notes (Signed)
Adult Psychoeducational Group Note  Date:  01/27/2013 Time:  10:56 AM  Group Topic/Focus:  Coping With Mental Health Crisis:   The purpose of this group is to help patients identify strategies for coping with mental health crisis.  Group discusses possible causes of crisis and ways to manage them effectively.  Participation Level:  Minimal  Participation Quality:  Attentive  Affect:  Appropriate  Cognitive:  Oriented  Insight: Good  Engagement in Group:  Limited  Modes of Intervention:  Discussion and Education  Additional Comments:  Pt shared that he is here to get better everyday.  Calena Salem T 01/27/2013, 10:56 AM

## 2013-01-27 NOTE — Progress Notes (Signed)
Patient did not attend the evening karaoke group. Pt was notified that group was beginning but remained in bed.    

## 2013-01-27 NOTE — Progress Notes (Signed)
[facility] LCSW Group Therapy  01/27/2013 1:15 PM  Type of Therapy:  Group Therapy  Participation Level:  Active  Participation Quality:  Appropriate, Attentive and Sharing  Affect:  Appropriate  Cognitive:  Appropriate  Insight:  Engaged, Improving  Engagement in Therapy:  Engaged  Modes of Intervention:  Discussion, Education, Exploration, Socialization and Support  Summary of Progress/Problems:  Group discussion focused on feelings about relapse; group members were able to process their shame guilt and frustration. 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 Kevin [name] shared that he has "probably felt some of this PAWS before but I never made it to the 2 years mark; but I will this time." Pt also shared one specific way to help himself is to not provide booze at his home for friends who drink.  "I think I'll put out a bunch of bibles out on the table and tell them it is bible study time, I guarantee they'll run."  Patient enjoyed a good joke or two but also expressed sincerity that he does not want to find himself in this situation again.    Clide Dales

## 2013-01-27 NOTE — Progress Notes (Signed)
[facility] LCSW Aftercare Discharge Planning Group Note  01/27/2013 8:45 AM  Participation Quality:  Appropriate  Affect:  Appropriate  Cognitive:  Appropriate and Oriented  Insight:  Improving  Engagement in Group:  Improving  Modes of Intervention:  Clarification, Exploration and Support  Summary of Progress/Problems: Pt denies both suicidal and homicidal ideation.  On a scale of 1 to 10 with ten being the most ever experienced, the patient rates depression at a 2 and anxiety at a 2. Patient reports he will follow up with AA meetings and sponsor.    Clide Dales 01/27/2013, 4:25 PM

## 2013-01-28 ENCOUNTER — Encounter (HOSPITAL_COMMUNITY): Payer: Self-pay | Admitting: Registered Nurse

## 2013-01-28 LAB — GLUCOSE, CAPILLARY
Glucose-Capillary: 123 mg/dL — ABNORMAL HIGH (ref 70–99)
Glucose-Capillary: 114 mg/dL — ABNORMAL HIGH (ref 70–99)
Glucose-Capillary: 143 mg/dL — ABNORMAL HIGH (ref 70–99)

## 2013-01-28 NOTE — Progress Notes (Signed)
Patient ID: Kevin [name], male   DOB: 15-Feb-1946, 67 y.o.   MRN: [id number] [facility] MD Progress Note  01/28/2013 5:08 PM Kevin [name]  MRN:  [id number] Subjective: Patient states that he has an alcohol problem.  I am feeling better.   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 Arthritis   . Gout   . Alcohol abuse   . Hypertension   . Hyperthyroidism   . Diabetes mellitus without complication   . GERD (gastroesophageal reflux disease)    Axis IV: problems with primary support group Axis V: 51-60 moderate symptoms  ADL's:  Intact  Sleep: Fair  Appetite:  Fair  Suicidal Ideation:  Plan:  Denies Intent:  Denies Means:  Denies Homicidal Ideation:  Plan:  Denies Intent:  Denies Means:  Denies AEB (as evidenced by):  Participating in group session; tolerating medications well  Psychiatric Specialty Exam: Review of Systems  HENT: Negative.   Psychiatric/Behavioral: Positive for depression (rates 2/10) and substance abuse. Nervous/anxious: Denies at this time.     Blood pressure 118/75, pulse 81, temperature 97 F (36.1 C), temperature source Oral, resp. rate 18, height 5\' 11"  (1.803 m), weight 106.142 kg (234 lb).Body mass index is 32.65 kg/(m^2).  General Appearance: Fairly Groomed  Patent attorney::  Fair  Speech:  Clear and Coherent and Slow  Volume:  Decreased  Mood:  Anxious and Depressed  Affect:  Restricted  Thought Process:  Coherent and Goal Directed  Orientation:  Full (Time, Place, and Person)  Thought Content:  Rumination and worries  Suicidal Thoughts:  No  Homicidal Thoughts:  No  Memory:  Immediate;   Fair Recent;   Fair Remote;   Fair  Judgement:  Fair  Insight:  superficial  Psychomotor Activity:  Decreased  Concentration:  Fair  Recall:  Fair  Akathisia:  No  Handed:  Right  AIMS (if indicated):     Assets:  Desire for  Improvement Housing  Sleep:  Number of Hours: 4.5   Current Medications: Current Facility-Administered Medications  Medication Dose Route Frequency Provider Last Rate Last Dose  . acetaminophen (TYLENOL) tablet 650 mg  650 mg Oral Q6H PRN Sanjuana Kava, NP      . alum & mag hydroxide-simeth (MAALOX/MYLANTA) 200-200-20 MG/5ML suspension 30 mL  30 mL Oral Q4H PRN Sanjuana Kava, NP      . irbesartan (AVAPRO) tablet 300 mg  300 mg Oral QHS Kerry Hough, PA   300 mg at 01/27/13 2144   And  . amLODipine (NORVASC) tablet 10 mg  10 mg Oral QHS Kerry Hough, PA   10 mg at 01/27/13 2144   And  . hydrochlorothiazide (MICROZIDE) capsule 12.5 mg  12.5 mg Oral QHS Kerry Hough, PA   12.5 mg at 01/27/13 2144  . citalopram (CELEXA) tablet 10 mg  10 mg Oral Daily Rachael Fee, MD   10 mg at 01/28/13 0816  . colchicine tablet 0.6 mg  0.6 mg Oral Daily Sanjuana Kava, NP   0.6 mg at 01/28/13 0816  . gabapentin (NEURONTIN) capsule 100 mg  100 mg Oral TID Sanjuana Kava, NP   100 mg at 01/28/13 1643  . levothyroxine (SYNTHROID, LEVOTHROID) tablet 100 mcg  100 mcg Oral QAC breakfast Sanjuana Kava, NP   100 mcg at 01/28/13 0654  . magnesium hydroxide (MILK OF MAGNESIA) suspension  30 mL  30 mL Oral Daily PRN Sanjuana Kava, NP      . metFORMIN (GLUCOPHAGE-XR) 24 hr tablet 1,000 mg  1,000 mg Oral QHS Sanjuana Kava, NP   1,000 mg at 01/27/13 2144  . multivitamin with minerals tablet 1 tablet  1 tablet Oral Daily Sanjuana Kava, NP   1 tablet at 01/28/13 0816  . pantoprazole (PROTONIX) EC tablet 40 mg  40 mg Oral Daily Sanjuana Kava, NP   40 mg at 01/28/13 0816  . thiamine (B-1) injection 100 mg  100 mg Intramuscular Once Sanjuana Kava, NP      . thiamine (VITAMIN B-1) tablet 100 mg  100 mg Oral Daily Sanjuana Kava, NP   100 mg at 01/28/13 4098    Lab Results:  Results for orders placed during the hospital encounter of 01/24/13 (from the past 48 hour(s))  GLUCOSE, CAPILLARY     Status: Abnormal   Collection  Time    01/26/13  9:41 PM      Result Value Range   Glucose-Capillary 126 (*) 70 - 99 mg/dL   Comment 1 Notify RN     Comment 2 Documented in Chart    GLUCOSE, CAPILLARY     Status: Abnormal   Collection Time    01/27/13  6:06 AM      Result Value Range   Glucose-Capillary 122 (*) 70 - 99 mg/dL  GLUCOSE, CAPILLARY     Status: Abnormal   Collection Time    01/27/13 12:05 PM      Result Value Range   Glucose-Capillary 112 (*) 70 - 99 mg/dL  GLUCOSE, CAPILLARY     Status: Abnormal   Collection Time    01/27/13  5:40 PM      Result Value Range   Glucose-Capillary 122 (*) 70 - 99 mg/dL  GLUCOSE, CAPILLARY     Status: Abnormal   Collection Time    01/27/13 10:02 PM      Result Value Range   Glucose-Capillary 146 (*) 70 - 99 mg/dL  GLUCOSE, CAPILLARY     Status: Abnormal   Collection Time    01/28/13  6:04 AM      Result Value Range   Glucose-Capillary 123 (*) 70 - 99 mg/dL  GLUCOSE, CAPILLARY     Status: Abnormal   Collection Time    01/28/13 12:13 PM      Result Value Range   Glucose-Capillary 114 (*) 70 - 99 mg/dL    Physical Findings: AIMS: Facial and Oral Movements Muscles of Facial Expression: None, normal Lips and Perioral Area: None, normal Jaw: None, normal Tongue: None, normal,Extremity Movements Upper (arms, wrists, hands, fingers): None, normal Lower (legs, knees, ankles, toes): None, normal, Trunk Movements Neck, shoulders, hips: None, normal, Overall Severity Severity of abnormal movements (highest score from questions above): None, normal Incapacitation due to abnormal movements: None, normal Patient's awareness of abnormal movements (rate only patient's report): No Awareness, Dental Status Current problems with teeth and/or dentures?: No Does patient usually wear dentures?: No  CIWA:  CIWA-Ar Total: 2 COWS:  COWS Total Score: 2  Treatment Plan Summary: Daily contact with patient to assess and evaluate symptoms and progress in treatment Medication  management  Plan: Supportive approach/coping skills/relapse prevention           Trial with Celexa 10 mg daily Will continue current treatment plan Medical Decision Making Problem Points:  Established problem, stable/improving (1), Review of last therapy session (1) and  Review of psycho-social stressors (1) Data Points:  Review or order clinical lab tests (1) Review of medication regiment & side effects (2) Review of new medications or change in dosage (2)  I certify that inpatient services furnished can reasonably be expected to improve the patient's condition.   Rankin, Shuvon 01/28/2013, 5:08 PM

## 2013-01-28 NOTE — Progress Notes (Signed)
Patient ID: Kevin [name], male   DOB: 1946-01-25, 67 y.o.   MRN: [id number] D)  Has been up and about in the milieu this evening, interacting with select peers and staff.  Attended group, had snack and came to med window afterward, no c/o's voiced.  A) Will continue to monitor, continue POC.  R) Safety maintained.

## 2013-01-28 NOTE — Progress Notes (Signed)
Adult Psychoeducational Group Note  Date:  01/28/2013 Time:  3:06 PM  Group Topic/Focus:  Healthy Communication:   The focus of this group is to discuss communication, barriers to communication, as well as healthy ways to communicate with others.  Participation Level:  Active  Participation Quality:  Appropriate, Attentive, Sharing and Supportive  Affect:  Appropriate  Cognitive:  Alert  Insight: Good  Engagement in Group:  Engaged and Supportive  Modes of Intervention:  Discussion, Exploration, Problem-solving and Support  Additional Comments:  Pt showed good insight and played a supportive role to others in group, pt was attentive and actively participated and shared.  Alfonse Spruce 01/28/2013, 3:06 PM

## 2013-01-28 NOTE — Progress Notes (Signed)
Patient ID: Kevin [name], male   DOB: [birth year], 67 y.o.   MRN: [id number] D: Pt is awake and active on the unit this AM. Pt denies SI/HI and A/V hallucinations. Pt is participating in the milieu and is cooperative with staff. Pt rates their depression at 3 and hopelessness at 2. Pt's most recent CIWA score was . Pt mood is depressed and his affect is flat. Pt writes that he wants to change everything to take better care of himself. Pt shares in group but he does minimize his ETOH dependence.   A: Writer utilized therapeutic communication, encouraged pt to discuss feelings with staff and administered medication per MD orders. Writer also encouraged pt to attend groups.  R: Pt is attending groups and tolerating medications well. Writer will continue to monitor. 15 minute checks are ongoing for safety.

## 2013-01-28 NOTE — Progress Notes (Signed)
Adult Psychoeducational Group Note  Date:  01/28/2013 Time:  0900  Group Topic/Focus:  Adult Psychoeducational Group Note  Participation Level:  Active  Participation Quality:  Appropriate, Attentive, Sharing and Supportive  Affect:  Appropriate  Cognitive:  Alert, Appropriate and Oriented  Insight: Improving  Engagement in Group:  Engaged and Supportive  Modes of Intervention:  Clarification, education, explaination  Additional Comments:  Pt minimizes his dependence on ETOH but is engaged and vested in treatment. Pt wants to restore his normal way of life.  Glorimar Stroope Shari Prows 01/28/2013, 10:11 AM

## 2013-01-28 NOTE — Clinical Social Work Note (Signed)
[facility] Group Notes:  (Clinical Social Work)  01/28/2013     10-11AM  Summary of Progress/Problems:   The main focus of today's process group was for the patient to identify ways in which they have in the past sabotaged their own recovery. Motivational Interviewing was utilized to ask the group members what they get out of their substance use, and what they want to change.  The Stages of Change were explained, and members identified where they currently are with regard to stages of change.  The patient expressed that he uses alcohol to get high, but then he ends up being sick.  His motivation is 7-8 out of 10, and he believes that as he gets home and does things around the house that have been neglected the last 6 months, he will become even more motivated.  He enjoys walking, and feels much more relaxed after a walk, so he will start walking daily. He stated that staying busy is soothing to him.  He would like to have a PRN medication that he feels he would use seldom, but it would be a comfort to know something for his anxiety was available.  Type of Therapy:  Group Therapy - Process   Participation Level:  Active  Participation Quality:  Attentive, Sharing and Supportive  Affect:  Blunted  Cognitive:  Alert, Appropriate and Oriented  Insight:  Engaged  Engagement in Therapy:  Engaged  Modes of Intervention:  Education, Teacher, English as a foreign language, Exploration, Discussion, Motivational Interviewing   Ambrose Mantle, LCSW 01/28/2013, 12:34 PM

## 2013-01-29 ENCOUNTER — Encounter (HOSPITAL_COMMUNITY): Payer: Self-pay | Admitting: Registered Nurse

## 2013-01-29 LAB — GLUCOSE, CAPILLARY

## 2013-01-29 MED ORDER — HYDROXYZINE HCL 25 MG PO TABS
25.0000 mg | ORAL_TABLET | Freq: Three times a day (TID) | ORAL | Status: DC | PRN
  Administered 2013-01-29 – 2013-01-30 (×3): 25 mg via ORAL
  Filled 2013-01-29: qty 10

## 2013-01-29 NOTE — Progress Notes (Signed)
Patient ID: Kevin [name], male   DOB: 10-16-1946, 67 y.o.   MRN: [id number] [facility] MD Progress Note  01/29/2013 3:34 PM Shantel Wesely  MRN:  [id number] Subjective: Patient states that he is feeling good.  States that he may need something for his nerves or slight anxiety when he goes home but doesn't want xanax.  Patient states that he used vistaril at home and it worked.   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 Arthritis   . Gout   . Alcohol abuse   . Hypertension   . Hyperthyroidism   . Diabetes mellitus without complication   . GERD (gastroesophageal reflux disease)    Axis IV: problems with primary support group Axis V: 51-60 moderate symptoms  ADL's:  Intact  Sleep: Fair  Appetite:  Fair  Suicidal Ideation:  Plan:  Denies Intent:  Denies Means:  Denies Homicidal Ideation:  Plan:  Denies Intent:  Denies Means:  Denies AEB (as evidenced by):  Participating in group session; tolerating medications well  Psychiatric Specialty Exam: Review of Systems  HENT: Negative.   Psychiatric/Behavioral: Positive for depression (rates 2/10) and substance abuse. Nervous/anxious: Denies at this time.     Blood pressure 125/74, pulse 72, temperature 98.5 F (36.9 C), temperature source Oral, resp. rate 20, height 5\' 11"  (1.803 m), weight 106.142 kg (234 lb).Body mass index is 32.65 kg/(m^2).  General Appearance: Fairly Groomed  Patent attorney::  Fair  Speech:  Clear and Coherent and Slow  Volume:  Decreased  Mood:  Anxious and Depressed  Affect:  Restricted  Thought Process:  Coherent and Goal Directed  Orientation:  Full (Time, Place, and Person)  Thought Content:  Rumination and worries  Suicidal Thoughts:  No  Homicidal Thoughts:  No  Memory:  Immediate;   Fair Recent;   Fair Remote;   Fair  Judgement:  Fair  Insight:  superficial  Psychomotor  Activity:  Decreased  Concentration:  Fair  Recall:  Fair  Akathisia:  No  Handed:  Right  AIMS (if indicated):     Assets:  Desire for Improvement Housing  Sleep:  Number of Hours: 6.25   Current Medications: Current Facility-Administered Medications  Medication Dose Route Frequency Shlonda Dolloff Last Rate Last Dose  . acetaminophen (TYLENOL) tablet 650 mg  650 mg Oral Q6H PRN Sanjuana Kava, NP   650 mg at 01/29/13 0804  . alum & mag hydroxide-simeth (MAALOX/MYLANTA) 200-200-20 MG/5ML suspension 30 mL  30 mL Oral Q4H PRN Sanjuana Kava, NP      . irbesartan (AVAPRO) tablet 300 mg  300 mg Oral QHS Kerry Hough, PA   300 mg at 01/28/13 2137   And  . amLODipine (NORVASC) tablet 10 mg  10 mg Oral QHS Kerry Hough, PA   10 mg at 01/28/13 2137   And  . hydrochlorothiazide (MICROZIDE) capsule 12.5 mg  12.5 mg Oral QHS Kerry Hough, PA   12.5 mg at 01/28/13 2137  . citalopram (CELEXA) tablet 10 mg  10 mg Oral Daily Rachael Fee, MD   10 mg at 01/29/13 0805  . colchicine tablet 0.6 mg  0.6 mg Oral Daily Sanjuana Kava, NP   0.6 mg at 01/29/13 0804  . gabapentin (NEURONTIN) capsule 100 mg  100 mg Oral TID Sanjuana Kava, NP   100 mg at 01/29/13 1133  . levothyroxine (  SYNTHROID, LEVOTHROID) tablet 100 mcg  100 mcg Oral QAC breakfast Sanjuana Kava, NP   100 mcg at 01/29/13 [phone]  . magnesium hydroxide (MILK OF MAGNESIA) suspension 30 mL  30 mL Oral Daily PRN Sanjuana Kava, NP      . metFORMIN (GLUCOPHAGE-XR) 24 hr tablet 1,000 mg  1,000 mg Oral QHS Sanjuana Kava, NP   1,000 mg at 01/28/13 2137  . multivitamin with minerals tablet 1 tablet  1 tablet Oral Daily Sanjuana Kava, NP   1 tablet at 01/29/13 0804  . pantoprazole (PROTONIX) EC tablet 40 mg  40 mg Oral Daily Sanjuana Kava, NP   40 mg at 01/29/13 0805  . thiamine (B-1) injection 100 mg  100 mg Intramuscular Once Sanjuana Kava, NP      . thiamine (VITAMIN B-1) tablet 100 mg  100 mg Oral Daily Sanjuana Kava, NP   100 mg at 01/29/13 1191     Lab Results:  Results for orders placed during the hospital encounter of 01/24/13 (from the past 48 hour(s))  GLUCOSE, CAPILLARY     Status: Abnormal   Collection Time    01/27/13  5:40 PM      Result Value Range   Glucose-Capillary 122 (*) 70 - 99 mg/dL  GLUCOSE, CAPILLARY     Status: Abnormal   Collection Time    01/27/13 10:02 PM      Result Value Range   Glucose-Capillary 146 (*) 70 - 99 mg/dL  GLUCOSE, CAPILLARY     Status: Abnormal   Collection Time    01/28/13  6:04 AM      Result Value Range   Glucose-Capillary 123 (*) 70 - 99 mg/dL  GLUCOSE, CAPILLARY     Status: Abnormal   Collection Time    01/28/13 12:13 PM      Result Value Range   Glucose-Capillary 114 (*) 70 - 99 mg/dL  GLUCOSE, CAPILLARY     Status: Abnormal   Collection Time    01/28/13  7:58 PM      Result Value Range   Glucose-Capillary 143 (*) 70 - 99 mg/dL  GLUCOSE, CAPILLARY     Status: Abnormal   Collection Time    01/29/13 11:38 AM      Result Value Range   Glucose-Capillary 114 (*) 70 - 99 mg/dL    Physical Findings: AIMS: Facial and Oral Movements Muscles of Facial Expression: None, normal Lips and Perioral Area: None, normal Jaw: None, normal Tongue: None, normal,Extremity Movements Upper (arms, wrists, hands, fingers): None, normal Lower (legs, knees, ankles, toes): None, normal, Trunk Movements Neck, shoulders, hips: None, normal, Overall Severity Severity of abnormal movements (highest score from questions above): None, normal Incapacitation due to abnormal movements: None, normal Patient's awareness of abnormal movements (rate only patient's report): No Awareness, Dental Status Current problems with teeth and/or dentures?: No Does patient usually wear dentures?: No  CIWA:  CIWA-Ar Total: 4 COWS:  COWS Total Score: 2  Treatment Plan Summary: Daily contact with patient to assess and evaluate symptoms and progress in treatment Medication management  Plan: Supportive  approach/coping skills/relapse prevention           Trial with Celexa 10 mg daily Will continue current treatment plan Medical Decision Making Problem Points:  Established problem, stable/improving (1), Review of last therapy session (1) and Review of psycho-social stressors (1) Data Points:  Review or order clinical lab tests (1) Review of medication regiment & side effects (2) Review  of new medications or change in dosage (2)  I certify that inpatient services furnished can reasonably be expected to improve the patient's condition.   Rankin, Shuvon 01/29/2013, 3:34 PM

## 2013-01-29 NOTE — Progress Notes (Signed)
Adult Psychoeducational Group Note  Date:  01/29/2013 Time:  0900  Group Topic/Focus:  Self Inventory Review  Participation Level:  Active  Participation Quality:  Appropriate and Sharing  Affect:  Appropriate  Cognitive:  Alert, Appropriate and Oriented  Insight: Good  Engagement in Group:  Engaged  Modes of Intervention:  Discussion  Additional Comments:    Barbette Merino, ERIC Shari Prows 01/29/2013, 9:50 AM

## 2013-01-29 NOTE — Clinical Social Work Note (Signed)
[facility] Group Notes:  (Clinical Social Work)  01/29/2013  10:00-11:00AM  Summary of Progress/Problems:   The main focus of today's process group was to define "support" and describe what healthy supports are, then to identify the patient's current support system and decide on other supports that can be put in place to prevent future hospitalizations.   Discussion ensued in which patients determined that drastic changes are necessary to obtain the drastic changes they want in their lives.  Scaling questions were asked to determine patients' level of motivation to seek support and confidence in doing so (1 lowest - 10 highest). 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  He even took out their garbage and got all their medications filled, then told them all he would be gone for about 8 days before coming to the hospital.  He stated he has a buddy who has been here to the hospital to see him, and this person is a strong, sober support.  At discharge, his plan is to keep his bible and some religious pamphlets nearby, and whenever someone from his old life comes up, he will preach to them.  If they stay, they will receive the preaching, but he suspects many of them will leave immediately.  His motivation to seek out supports is 10 out of 10.  Type of Therapy:  Process Group with Motivational Interviewing  Participation Level:  Active  Participation Quality:  Attentive, Sharing and Supportive  Affect:  Blunted  Cognitive:  Alert, Appropriate and Oriented  Insight:  Engaged  Engagement in Therapy:  Engaged  Modes of Intervention:  Clarification, Education, Limit-setting, Problem-solving, Socialization, Support and Processing, Exploration, Discussion, Role-Play   Kevin Mantle, LCSW 01/29/2013, 12:19 PM

## 2013-01-29 NOTE — Progress Notes (Signed)
Adult Psychoeducational Group Note  Date:  01/29/2013 Time:  1315  Group Topic/Focus:  Making Healthy Choices:   The focus of this group is to help patients identify negative/unhealthy choices they were using prior to admission and identify positive/healthier coping strategies to replace them upon discharge. Relapse Prevention Planning:   The focus of this group is to define relapse and discuss the need for planning to combat relapse.  Participation Level:  Active  Participation Quality:  Appropriate, Attentive, Sharing and Supportive  Affect:  Appropriate  Cognitive:  Alert, Appropriate and Oriented  Insight: Improving  Engagement in Group:  Developing/Improving, Engaged and Supportive  Modes of Intervention:  Clarification, Discussion, Education and Support  Additional Comments:  Pt is gaining insight and relates well to the psycho-educational materials, and offers his perspective.  JENSEN, ERIC Shari Prows 01/29/2013, 2:29 PM

## 2013-01-29 NOTE — Progress Notes (Signed)
Patient ID: Kevin [name], male   DOB: [birth year], 67 y.o.   MRN: [id number] D)  Has been out in the dayroom this evening, interacting appropriately with staff and select peers. Has been pleasant, cooperative, attended group, compliant with meds.  States has been trying to watch his diet closely to help keep his diabetes regulated, feels fairly confident with diet control. Denies thoughts of self harm. A)  Will continue to monitor for safety, continue POC. R)  Safety maintained,

## 2013-01-29 NOTE — Progress Notes (Signed)
Patient ID: Kevin [name], male   DOB: [birth year], 67 y.o.   MRN: [id number] D: Pt is awake and active on the unit this AM. Pt denies SI/HI and A/V hallucinations. Pt is participating in the milieu and is cooperative with staff. Pt rates their depression at 3 and hopelessness at 2. Pt's most recent CIWA score was 2. Pt mood is appropriate/depressed and his affect is flat. Pt writes that he plans to "take medications and stay clean." Pt c/o anxiety but states that it is tolerable. Pt seems to have good insight and is receptive to staff input during groups.   A: Writer utilized therapeutic communication, encouraged pt to discuss feelings with staff and administered medication per MD orders. Writer also encouraged pt to attend groups.  R: Pt is attending groups and tolerating medications well. Writer will continue to monitor. 15 minute checks are ongoing for safety.

## 2013-01-30 LAB — GLUCOSE, CAPILLARY

## 2013-01-30 MED ORDER — METFORMIN HCL ER (MOD) 1000 MG PO TB24: 1000.0000 mg | ORAL_TABLET | Freq: Every day | ORAL | Status: DC

## 2013-01-30 MED ORDER — LANSOPRAZOLE 30 MG PO CPDR: 30.0000 mg | DELAYED_RELEASE_CAPSULE | Freq: Every day | ORAL | Status: DC

## 2013-01-30 MED ORDER — CITALOPRAM HYDROBROMIDE 10 MG PO TABS: 10.0000 mg | ORAL_TABLET | Freq: Every day | ORAL | Status: DC

## 2013-01-30 MED ORDER — LEVOTHYROXINE SODIUM 100 MCG PO TABS: 100.0000 ug | ORAL_TABLET | Freq: Every day | ORAL | Status: DC

## 2013-01-30 MED ORDER — HYDROXYZINE HCL 25 MG PO TABS: 25.0000 mg | ORAL_TABLET | Freq: Three times a day (TID) | ORAL | Status: DC | PRN

## 2013-01-30 MED ORDER — OLMESARTAN-AMLODIPINE-HCTZ 40-10-12.5 MG PO TABS: 1.0000 | ORAL_TABLET | Freq: Every day | ORAL | Status: DC

## 2013-01-30 MED ORDER — GABAPENTIN 100 MG PO CAPS: 100.0000 mg | ORAL_CAPSULE | Freq: Three times a day (TID) | ORAL | Status: DC

## 2013-01-30 MED ORDER — COLCHICINE 0.6 MG PO TABS: 0.6000 mg | ORAL_TABLET | Freq: Every day | ORAL | Status: DC

## 2013-01-30 NOTE — Progress Notes (Signed)
Adult Psychoeducational Group Note  Date:  01/30/2013 Time:  11:14 AM  Group Topic/Focus:  Healthy Communication:   The focus of this group is to discuss communication, barriers to communication, as well as healthy ways to communicate with others.  Participation Level:  Active  Participation Quality:  Attentive  Affect:  Appropriate  Cognitive:  Appropriate  Insight: Good  Engagement in Group:  Engaged  Modes of Intervention:  Discussion and Education  Additional Comments:  Pt is here to get help with getting and staying sober.  Rifka Ramey T 01/30/2013, 11:14 AM

## 2013-01-30 NOTE — [facility] Suicide Risk Assessment (Signed)
Suicide Risk Assessment  Discharge Assessment     Demographic Factors:  Male and Living alone  Mental Status Per Nursing Assessment::   On Admission:     Current Mental Status by Physician: In full contact with reality. There are no suicidal ideas, plans or intent. His mood is euthymic. His affect is appropriate. He is committed to abstinence. He is going to avoid interacting with the people who were influential in his relapse.   Loss Factors: NA  Historical Factors: NA  Risk Reduction Factors:   Employed and Positive social support  Continued Clinical Symptoms:  Alcohol/Substance Abuse/Dependencies  Cognitive Features That Contribute To Risk: None identified   Suicide Risk:  Minimal: No identifiable suicidal ideation.  Patients presenting with no risk factors but with morbid ruminations; may be classified as minimal risk based on the severity of the depressive symptoms  Discharge Diagnoses:   AXIS I:  Alcohol Dependence/withdrawal, GAD AXIS II:  Deferred AXIS III:   Past Medical History  Diagnosis Date  . Arthritis   . Gout   . Alcohol abuse   . Hypertension   . Hyperthyroidism   . Diabetes mellitus without complication   . GERD (gastroesophageal reflux disease)    AXIS IV:  problems with primary support group AXIS V:  61-70 mild symptoms  Plan Of Care/Follow-up recommendations:  Activity:  AS tolerated Diet:  regular Will follow up outpatient basis Is patient on multiple antipsychotic therapies at discharge:  No   Has Patient had three or more failed trials of antipsychotic monotherapy by history:  No  Recommended Plan for Multiple Antipsychotic Therapies: N/A   Kevin [name] A 01/30/2013, 1:30 PM

## 2013-01-30 NOTE — Progress Notes (Signed)
Patient ID: Kevin [name], male   DOB: [birth year], 67 y.o.   MRN: [id number] Pt discharged at this time. Pt denies SI/HI. Discharge instructions provided and pt verbalized understanding. Supply of medications provided. All belongings returned.

## 2013-01-30 NOTE — Progress Notes (Signed)
[facility] LCSW Aftercare Discharge Planning Group Note  01/30/2013 11:32 AM  Participation Quality:  Appropriate  Affect:  Appropriate  Cognitive:  Alert, Appropriate and Oriented  Insight:  Improving  Engagement in Group:  Improving  Modes of Intervention:  Clarification, Exploration and Support  Summary of Progress/Problems:  Pt denies both suicidal and homicidal ideation.  On a scale of 1 to 10 with ten being the most ever experienced, the patient rates depression at a 1 and anxiety at a 1. Patient requests followup with his medical doctor and agrees to go to [facility] in [place] and [place]    Clide Dales 01/30/2013, 11:32 AM

## 2013-01-30 NOTE — Progress Notes (Signed)
Patient ID: Kevin [name], male   DOB: 1946-11-28, 67 y.o.   MRN: [id number] D)  Has been pleasant and cooperative this evening, interacting appropriately with staff and peers.  CBG was 122, feels has good understanding of diet management for his diabetes.. Attended group, came to med window afterward for hs meds. A)  Will continue to monitor for safety, cont.POC R)  Safety maintained.

## 2013-01-30 NOTE — Progress Notes (Signed)
[facility] INPATIENT:  Family/Significant Other Suicide Prevention Education  Suicide Prevention Education:  Patient Refusal for Family/Significant Other Suicide Prevention Education: The patient Kevin [name] has refused to provide written consent for family/significant other to be provided Family/Significant Other Suicide Prevention Education during admission and/or prior to discharge.  Physician notified. Patient's care plan expressed need for suicide risk care plan yet patient never expressed suicidal ideation during admit and or inpatient stay.   Clide Dales 01/30/2013, 2:48 PM

## 2013-01-30 NOTE — Plan of Care (Signed)
Problem: Ineffective individual coping Goal: STG: Patient will participate in after care plan Outcome: Adequate for Discharge Patient requests follow up with primary care physician and agrees to go to AA meetings  Problem: Alteration in mood & ability to function due to Goal: LTG-Patient demonstrates decreased signs of withdrawal (Patient demonstrates decreased signs of withdrawal to the point the patient is safe to return home and continue treatment in an outpatient setting)  Outcome: Adequate for Discharge Patient completed detox protocol

## 2013-01-30 NOTE — Progress Notes (Signed)
Patient did attend the evening speaker AA meeting.  

## 2013-01-30 NOTE — Discharge Summary (Signed)
Physician Discharge Summary Note  Patient:  Kevin [name] is an 67 y.o., male MRN:  [id number] DOB:  1946/08/23 Patient phone:  [phone] (home)  Patient address:   [address] [place] [address],   Date of Admission:  01/24/2013  Date of Discharge: 01/30/13  Reason for Admission: Alcohol detoxification.  Discharge Diagnoses: Active Problems:   Alcohol dependence   Alcohol withdrawal   Generalized anxiety disorder  Review of Systems  Constitutional: Negative.   HENT: Negative.   Eyes: Negative.   Respiratory: Negative.   Cardiovascular: Negative.   Gastrointestinal: Negative.   Genitourinary: Negative.   Musculoskeletal: Positive for myalgias and joint pain.  Skin: Negative.   Neurological: Negative.   Endo/Heme/Allergies: Negative.   Psychiatric/Behavioral: Positive for depression (Stabilized with medication prior to discarge.), hallucinations and substance abuse. Negative for suicidal ideas and memory loss. The patient is nervous/anxious (Stabilized with medication prior to discharge). The patient does not have insomnia.    Axis Diagnosis:   AXIS I:  Alcohol Abuse AXIS II:  Deferred AXIS III:   Past Medical History  Diagnosis Date  . Arthritis   . Gout   . Alcohol abuse   . Hypertension   . Hyperthyroidism   . Diabetes mellitus without complication   . GERD (gastroesophageal reflux disease)    AXIS IV:  Substance abuse issues AXIS V:  63  Level of Care:  OP  Hospital Course:  Retired, started having a drink with the boys, then increased to fifth between a day and a night. No particular reason why the relapse "cant blame anyone." "My main problem, stay nervous, (had panic attacks.)" Sister died 6 months ago, cancer. Has had panic starting when he was working and went inside the plant. 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 Wants to quit, address the anxiety,   After admission assessment and evaluation, it was  determined that patient will need detoxification treatment to stabilize her system of drug intoxication and to combat the withdrawal symptoms of substances. And his discharge plans included an appointment for a follow-up care an outpatient clinic. Kevin [name] was then started on Librium protocol for his alcohol detoxification. He was also enrolled in group counseling sessions and activities to learn coping skills that should help him after discharge to cope better, manage his substance abuse problems to maintain a much longer sobriety. He also was enrolled and attended AA/NA meetings being offered and held on this unit. He has some previous and or identifiable medical conditions that required treatment and or monitoring. He received medication management for all those health issues as well. He was monitored closely for any potential problems that may arise as a result of and or during detoxification treatment. Patient tolerated his treatment regimen and detoxification treatment without any significant adverse effects and or reactions.  Besides detoxification treatment, Kevin [name] also received Citalopram 10 mg daily for depression, Gabapentin 100 mg tid for anxiety/pain control and hydroxyzine 25 mg for anxiety. Patient attended treatment team meeting this am and met with the treatment team. His symptoms, substance abuse issues, response to treatment and discharge plans discussed. Patient endorsed that he is doing well and stable for discharge to pursue the next phase of his substance abuse treatment.  He was encouraged to join/attend AA/NA meetings being offered and held within his community. He is instructed and encouraged to get a trusted sponsor from the advise of others or from whomever within the AA meetings  seems to make sense, and who has a proven track record, and will hold him responsible for his sobriety, and both expects and insists on his total  abstinence from alcohol. He must focus the first of  each month on the speaker meetings where he will specifically look at how his life has been wrecked by drugs/alcohol and how his life has been similar to that of the speaker's life.   It was agreed upon between patient and the team that he will be discharged to his home that he shares with his family.  Upon discharge, patient adamantly denies suicidal, homicidal ideations, auditory, visual hallucinations, delusional thinking and or withdrawal symptoms. Patient left [facility] with all personal belongings in no apparent distress. He received 4 days worth samples of his discharge medications. Transportation per family.   Consults:  None  Significant Diagnostic Studies:  labs: CBC with diff, CMP, UDS, Toxicology test.  Discharge Vitals:   Blood pressure 119/70, pulse 67, temperature 97.6 F (36.4 C), temperature source Oral, resp. rate 22, height 5\' 11"  (1.803 m), weight 106.142 kg (234 lb). Body mass index is 32.65 kg/(m^2). Lab Results:   Results for orders placed during the hospital encounter of 01/24/13 (from the past 72 hour(s))  GLUCOSE, CAPILLARY     Status: Abnormal   Collection Time    01/28/13  7:58 PM      Result Value Range   Glucose-Capillary 143 (*) 70 - 99 mg/dL  GLUCOSE, CAPILLARY     Status: Abnormal   Collection Time    01/29/13 11:38 AM      Result Value Range   Glucose-Capillary 114 (*) 70 - 99 mg/dL  GLUCOSE, CAPILLARY     Status: Abnormal   Collection Time    01/29/13  9:30 PM      Result Value Range   Glucose-Capillary 122 (*) 70 - 99 mg/dL   Comment 1 Notify RN     Comment 2 Documented in Chart    GLUCOSE, CAPILLARY     Status: Abnormal   Collection Time    01/30/13  6:13 AM      Result Value Range   Glucose-Capillary 117 (*) 70 - 99 mg/dL   Comment 1 Notify RN     Comment 2 Documented in Chart      Physical Findings: AIMS: Facial and Oral Movements Muscles of Facial Expression: None, normal Lips and Perioral Area: None, normal Jaw: None, normal Tongue:  None, normal,Extremity Movements Upper (arms, wrists, hands, fingers): None, normal Lower (legs, knees, ankles, toes): None, normal, Trunk Movements Neck, shoulders, hips: None, normal, Overall Severity Severity of abnormal movements (highest score from questions above): None, normal Incapacitation due to abnormal movements: None, normal Patient's awareness of abnormal movements (rate only patient's report): No Awareness, Dental Status Current problems with teeth and/or dentures?: No Does patient usually wear dentures?: No  CIWA:  CIWA-Ar Total: 1 COWS:  COWS Total Score: 2  Psychiatric Specialty Exam: See Psychiatric Specialty Exam and Suicide Risk Assessment completed by Attending Physician prior to discharge. In full contact with reality. There are no suicidal ideas plans or intent. His mood is uethymic, his affect is appropiate Discharge destination:  Home  Is patient on multiple antipsychotic therapies at discharge:  No   Has Patient had three or more failed trials of antipsychotic monotherapy by history:  No  Recommended Plan for Multiple Antipsychotic Therapies: NA     Medication List    STOP taking these medications  vardenafil 10 MG tablet  Commonly known as:  LEVITRA      TAKE these medications     Indication   citalopram 10 MG tablet  Commonly known as:  CELEXA  Take 1 tablet (10 mg total) by mouth daily. For depression   Indication:  Depression     colchicine 0.6 MG tablet  Take 1 tablet (0.6 mg total) by mouth daily. For gout arthritis   Indication:  Acute Joint Inflammation in Gout     gabapentin 100 MG capsule  Commonly known as:  NEURONTIN  Take 1 capsule (100 mg total) by mouth 3 (three) times daily. For anxiety/anxiety   Indication:  anxiety/pain control     hydrOXYzine 25 MG tablet  Commonly known as:  ATARAX/VISTARIL  Take 1 tablet (25 mg total) by mouth 3 (three) times daily as needed for anxiety.   Indication:  Anxiety associated with  Organic Disease     lansoprazole 30 MG capsule  Commonly known as:  PREVACID  Take 1 capsule (30 mg total) by mouth daily. For acid reflux   Indication:  Gastroesophageal Reflux Disease     levothyroxine 100 MCG tablet  Commonly known as:  SYNTHROID, LEVOTHROID  Take 1 tablet (100 mcg total) by mouth daily. For thyroid hormone replacement   Indication:  Underactive Thyroid     metFORMIN 1000 MG (MOD) 24 hr tablet  Commonly known as:  GLUMETZA  Take 1 tablet (1,000 mg total) by mouth daily with breakfast. For diabetes   Indication:  Diabetes mellitus     Olmesartan-Amlodipine-HCTZ 40-10-12.5 MG Tabs  Commonly known as:  TRIBENZOR  Take 1 tablet by mouth daily. For hypertension   Indication:  Hypertension       Follow-up Information   Follow up with [facility] On 02/02/2013. (Appointment is with Dr Evans Lance on Thursday 3.6.14 at 1:45 PM )    Contact information:   [address] [place], [place] [address] Ph [phone] Fax [phone]     Follow-up recommendations: Activity:  as tolerated Other:  Keep all scheduled follow-up appointments as recommended.   Comments:  Take all your medications as prescribed by your mental healthcare provider. Report any adverse effects and or reactions from your medicines to your outpatient provider promptly. Patient is instructed and cautioned to not engage in alcohol and or illegal drug use while on prescription medicines. In the event of worsening symptoms, patient is instructed to call the crisis hotline, 911 and or go to the nearest ED for appropriate evaluation and treatment of symptoms. Follow-up with your primary care provider for your other medical issues, concerns and or health care needs.   Total Discharge Time:  Greater than 30 minutes  Signed: Armandina Stammer I 01/31/2013, 2:54 PM

## 2013-01-30 NOTE — Progress Notes (Signed)
[facility] LCSW Group Therapy  01/30/2013 1:15 PM  Type of Therapy:  Group Therapy from 1:15 to 2:30  Participation Level:  Active  Participation Quality:  Appropriate, Attentive and Sharing  Affect:  Appropriate  Cognitive:  Appropriate  Insight:  Developing/Improving  Engagement in Therapy:  Developing/Improving  Modes of Intervention:  Clarification, Discussion, Exploration, Problem-solving and Support  Summary of Progress/Problems:  Group discussion focused on what patient's see as their own obstacles to recovery.  Patient shares belief that boredom will be difficult to deal with and was able to process how he might better manage his time during the months that he does not work.  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 Pt expressed gratitude for help.   Kevin [name]

## 2013-01-30 NOTE — Progress Notes (Signed)
[facility] Adult Case Management Discharge Plan :  Will you be returning to the same living situation after discharge: Yes,  to his own home.  At discharge, do you have transportation home?:Yes,  via a friend.  Do you have the ability to pay for your medications:Yes,  as insurance covers most  Release of information consent forms completed and in the chart;  Patient's signature needed at discharge.  Patient to Follow up at: Follow-up Information   Follow up with [facility] On 02/02/2013. (Appointment is with Dr Evans Lance on Thursday 3.6.14 at 1:45 PM )    Contact information:   [address] [place], [place] [address] Ph [phone] Fax [phone]      Patient denies SI/HI:   Yes,  denies both    Safety Planning and Suicide Prevention discussed:  Yes,  in discharge planning group.   Clide Dales 01/30/2013, 2:40 PM

## 2013-01-30 NOTE — Tx Team (Signed)
Interdisciplinary Treatment Plan Update (Adult)   Date: 01/30/2013   Time Reviewed: 9:49 AM   Progress in Treatment:  Attending groups: Yes Participating in groups: Yes Taking medication as prescribed: Yes  Tolerating medication: Yes  Family/Significant othe contact made: No  Patient understands diagnosis: Yes Discussing patient identified problems/goals with staff: Yes Medical problems stabilized or resolved: Yes Denies suicidal/homicidal ideation: Yes  Patient has not harmed self or Others: Yes  New problem(s) identified: None Identified   Discharge Plan or Barriers: CSW will refer patient to his primary care physician at patient's request.    Additional comments: N/A   Reason for Continuation of Hospitalization:  NA  Estimated length of stay: Discharge today   For review of initial/current patient goals, please see plan of care.  Attendees:  Patient:    Family:    Physician: Geoffery Lyons  01/30/2013 9:49 AM   Nursing: Roswell Miners, RN  01/30/2013 9:49 AM   Clinical Social Worker Ronda Fairly  01/30/2013 9:49 AM   Other: Norval Gable, NP Student  01/30/2013 9:49 AM   Other: Robbie Louis, RN  01/30/2013 9:49 AM   Other: Armandina Stammer, NO  01/30/2013 9:49 AM   Other:  01/30/2013 9:49 AM   Scribe for Treatment Team:  Carney Bern, LCSWA 01/30/2013 9:49 AM

## 2013-02-02 NOTE — Progress Notes (Signed)
Patient Discharge Instructions:  After Visit Summary (AVS):   Faxed to:  02/02/13 Discharge Summary Note:   Faxed to:  02/02/13 Psychiatric Admission Assessment Note:   Faxed to:  02/02/13 Suicide Risk Assessment - Discharge Assessment:   Faxed to:  02/02/13 Faxed/Sent to the Next Level Care provider:  02/02/13 Faxed to [facility] @ [phone]  Jerelene Redden, 02/02/2013, 3:08 PM

## 2013-09-04 ENCOUNTER — Emergency Department (HOSPITAL_COMMUNITY)
Admission: EM | Admit: 2013-09-04 | Discharge: 2013-09-05 | Disposition: A | Discharge status: Home / Self Care | Payer: 59 | Attending: Emergency Medicine | Admitting: Emergency Medicine

## 2013-09-04 ENCOUNTER — Encounter (HOSPITAL_COMMUNITY): Payer: Self-pay | Admitting: Emergency Medicine

## 2013-09-04 DIAGNOSIS — F1020 Alcohol dependence, uncomplicated: Secondary | ICD-10-CM | POA: Insufficient documentation

## 2013-09-04 DIAGNOSIS — F411 Generalized anxiety disorder: Secondary | ICD-10-CM

## 2013-09-04 DIAGNOSIS — I10 Essential (primary) hypertension: Secondary | ICD-10-CM | POA: Insufficient documentation

## 2013-09-04 DIAGNOSIS — M129 Arthropathy, unspecified: Secondary | ICD-10-CM | POA: Insufficient documentation

## 2013-09-04 DIAGNOSIS — E0590 Thyrotoxicosis, unspecified without thyrotoxic crisis or storm: Secondary | ICD-10-CM | POA: Insufficient documentation

## 2013-09-04 DIAGNOSIS — M109 Gout, unspecified: Secondary | ICD-10-CM | POA: Insufficient documentation

## 2013-09-04 DIAGNOSIS — F1110 Opioid abuse, uncomplicated: Secondary | ICD-10-CM | POA: Insufficient documentation

## 2013-09-04 DIAGNOSIS — E119 Type 2 diabetes mellitus without complications: Secondary | ICD-10-CM | POA: Insufficient documentation

## 2013-09-04 DIAGNOSIS — K219 Gastro-esophageal reflux disease without esophagitis: Secondary | ICD-10-CM | POA: Insufficient documentation

## 2013-09-04 DIAGNOSIS — Z79899 Other long term (current) drug therapy: Secondary | ICD-10-CM | POA: Insufficient documentation

## 2013-09-04 LAB — CBC WITH DIFFERENTIAL/PLATELET
HCT: 38 % — ABNORMAL LOW (ref 39.0–52.0)
Hemoglobin: 12.9 g/dL — ABNORMAL LOW (ref 13.0–17.0)
Lymphocytes Relative: 51 % — ABNORMAL HIGH (ref 12–46)
MCV: 88.6 fL (ref 78.0–100.0)
Monocytes Absolute: 0.3 10*3/uL (ref 0.1–1.0)
Monocytes Relative: 6 % (ref 3–12)
Neutro Abs: 2 10*3/uL (ref 1.7–7.7)
RBC: 4.29 MIL/uL (ref 4.22–5.81)
WBC: 5.1 10*3/uL (ref 4.0–10.5)

## 2013-09-04 LAB — BASIC METABOLIC PANEL
BUN: 23 mg/dL (ref 6–23)
CO2: 22 mEq/L (ref 19–32)
Calcium: 9.2 mg/dL (ref 8.4–10.5)
Chloride: 98 mEq/L (ref 96–112)
Creatinine, Ser: 1.26 mg/dL (ref 0.50–1.35)
GFR calc non Af Amer: 59 mL/min — ABNORMAL LOW (ref >90)
Glucose, Bld: 97 mg/dL (ref 70–99)

## 2013-09-04 LAB — RAPID URINE DRUG SCREEN, HOSP PERFORMED
Amphetamines: NOT DETECTED
Barbiturates: NOT DETECTED
Tetrahydrocannabinol: NOT DETECTED

## 2013-09-04 LAB — ETHANOL: Alcohol, Ethyl (B): 150 mg/dL — ABNORMAL HIGH (ref 0–11)

## 2013-09-04 MED ORDER — ZOLPIDEM TARTRATE 5 MG PO TABS: 5.0000 mg | ORAL_TABLET | Freq: Every evening | ORAL | Status: DC | PRN

## 2013-09-04 MED ORDER — COLCHICINE 0.6 MG PO TABS
0.6000 mg | ORAL_TABLET | Freq: Every day | ORAL | Status: DC
  Administered 2013-09-05: 0.6 mg via ORAL
  Filled 2013-09-04: qty 1

## 2013-09-04 MED ORDER — AMLODIPINE BESYLATE 10 MG PO TABS: 10.0000 mg | ORAL_TABLET | Freq: Every day | ORAL | Status: DC

## 2013-09-04 MED ORDER — METFORMIN HCL ER 500 MG PO TB24
1000.0000 mg | ORAL_TABLET | ORAL | Status: AC
  Administered 2013-09-04: 1000 mg via ORAL
  Filled 2013-09-04: qty 2

## 2013-09-04 MED ORDER — GABAPENTIN 100 MG PO CAPS
100.0000 mg | ORAL_CAPSULE | Freq: Three times a day (TID) | ORAL | Status: DC
  Administered 2013-09-04 – 2013-09-05 (×3): 100 mg via ORAL
  Filled 2013-09-04 (×5): qty 1

## 2013-09-04 MED ORDER — IRBESARTAN 300 MG PO TABS: 300.0000 mg | ORAL_TABLET | Freq: Every day | ORAL | Status: DC

## 2013-09-04 MED ORDER — OLMESARTAN-AMLODIPINE-HCTZ 40-10-12.5 MG PO TABS: 1.0000 | ORAL_TABLET | Freq: Every day | ORAL | Status: DC

## 2013-09-04 MED ORDER — ALUM & MAG HYDROXIDE-SIMETH 200-200-20 MG/5ML PO SUSP: 30.0000 mL | ORAL | Status: DC | PRN

## 2013-09-04 MED ORDER — IRBESARTAN 300 MG PO TABS
300.0000 mg | ORAL_TABLET | Freq: Every day | ORAL | Status: DC
  Administered 2013-09-04: 300 mg via ORAL
  Filled 2013-09-04 (×2): qty 1

## 2013-09-04 MED ORDER — HYDROCHLOROTHIAZIDE 12.5 MG PO CAPS: 12.5000 mg | ORAL_CAPSULE | Freq: Every day | ORAL | Status: DC

## 2013-09-04 MED ORDER — LORAZEPAM 1 MG PO TABS: 1.0000 mg | ORAL_TABLET | Freq: Three times a day (TID) | ORAL | Status: DC | PRN

## 2013-09-04 MED ORDER — PANTOPRAZOLE SODIUM 40 MG PO TBEC
40.0000 mg | DELAYED_RELEASE_TABLET | Freq: Every day | ORAL | Status: DC
  Administered 2013-09-04 – 2013-09-05 (×2): 40 mg via ORAL
  Filled 2013-09-04 (×2): qty 1

## 2013-09-04 MED ORDER — AMLODIPINE BESYLATE 10 MG PO TABS
10.0000 mg | ORAL_TABLET | Freq: Every day | ORAL | Status: DC
  Administered 2013-09-04: 10 mg via ORAL
  Filled 2013-09-04 (×2): qty 1

## 2013-09-04 MED ORDER — ONDANSETRON HCL 4 MG PO TABS: 4.0000 mg | ORAL_TABLET | Freq: Three times a day (TID) | ORAL | Status: DC | PRN

## 2013-09-04 MED ORDER — LEVOTHYROXINE SODIUM 100 MCG PO TABS
100.0000 ug | ORAL_TABLET | Freq: Every day | ORAL | Status: DC
  Administered 2013-09-05: 100 ug via ORAL
  Filled 2013-09-04 (×2): qty 1

## 2013-09-04 MED ORDER — METFORMIN HCL ER 500 MG PO TB24
1000.0000 mg | ORAL_TABLET | Freq: Every day | ORAL | Status: DC
  Filled 2013-09-04: qty 2

## 2013-09-04 MED ORDER — METFORMIN HCL ER 500 MG PO TB24
1000.0000 mg | ORAL_TABLET | ORAL | Status: DC
  Filled 2013-09-04: qty 2

## 2013-09-04 MED ORDER — HYDROCHLOROTHIAZIDE 12.5 MG PO CAPS
12.5000 mg | ORAL_CAPSULE | Freq: Every day | ORAL | Status: DC
  Administered 2013-09-04: 12.5 mg via ORAL
  Filled 2013-09-04 (×2): qty 1

## 2013-09-04 MED ORDER — LORAZEPAM 1 MG PO TABS: 0.0000 mg | ORAL_TABLET | Freq: Two times a day (BID) | ORAL | Status: DC

## 2013-09-04 MED ORDER — HYDROXYZINE HCL 25 MG PO TABS
25.0000 mg | ORAL_TABLET | Freq: Three times a day (TID) | ORAL | Status: DC | PRN
  Administered 2013-09-05: 25 mg via ORAL
  Filled 2013-09-04: qty 1

## 2013-09-04 MED ORDER — LORAZEPAM 1 MG PO TABS: 0.0000 mg | ORAL_TABLET | Freq: Four times a day (QID) | ORAL | Status: DC

## 2013-09-04 MED ORDER — CITALOPRAM HYDROBROMIDE 10 MG PO TABS
10.0000 mg | ORAL_TABLET | Freq: Every day | ORAL | Status: DC
  Administered 2013-09-04 – 2013-09-05 (×2): 10 mg via ORAL
  Filled 2013-09-04 (×3): qty 1

## 2013-09-04 MED ORDER — IBUPROFEN 200 MG PO TABS: 600.0000 mg | ORAL_TABLET | Freq: Three times a day (TID) | ORAL | Status: DC | PRN

## 2013-09-04 NOTE — ED Provider Notes (Signed)
CSN: [id number]     Arrival date & time 09/04/13  1740 History   First MD Initiated Contact with Patient 09/04/13 2107     Chief Complaint  Patient presents with  . Medical Clearance   (Consider location/radiation/quality/duration/timing/severity/associated sxs/prior Treatment) The history is provided by the patient and medical records. No language interpreter was used.    Kevin [name] is a 67 y.o. male  with a hx of arthritis, gout, alcohol abuse, HTN, GERD, NIDDM presents to the Emergency Department complaining of gradual, persistent, progressively worsening alcohol abuse onset 6 months ago.  Pt reports he is drinking a 5th of liquor each night.  He also reports abusing narcotics (vicodin and percocet) for 6 mos as well.  Pt reports he has been to rehab before with the last time being Jan 2014 at [facility].  Pt reports his sister died of cancer and he believes this caused his relapse, but does not want to blame his abuse on this.  Pt denies all somatic symoptoms.  He denies SI/HI, A/V hallucinations.    Past Medical History  Diagnosis Date  . Arthritis   . Gout   . Alcohol abuse   . Hypertension   . Hyperthyroidism   . Diabetes mellitus without complication   . GERD (gastroesophageal reflux disease)    History reviewed. No pertinent past surgical history. History reviewed. No pertinent family history. History  Substance Use Topics  . Smoking status: Never Smoker   . Smokeless tobacco: Never Used  . Alcohol Use: Yes     Comment: one fifth daily    Review of Systems  Constitutional: Negative for fever, diaphoresis, appetite change, fatigue and unexpected weight change.  HENT: Negative for mouth sores and neck stiffness.   Eyes: Negative for visual disturbance.  Respiratory: Negative for cough, chest tightness, shortness of breath and wheezing.   Cardiovascular: Negative for chest pain.  Gastrointestinal: Negative for nausea, vomiting, abdominal pain, diarrhea and constipation.   Endocrine: Negative for polydipsia, polyphagia and polyuria.  Genitourinary: Negative for dysuria, urgency, frequency and hematuria.  Musculoskeletal: Negative for back pain.  Skin: Negative for rash.  Allergic/Immunologic: Negative for immunocompromised state.  Neurological: Negative for syncope, light-headedness and headaches.  Hematological: Does not bruise/bleed easily.  Psychiatric/Behavioral: Negative for sleep disturbance. The patient is not nervous/anxious.     Allergies  Review of patient's allergies indicates no known allergies.  Home Medications   Current Outpatient Rx  Name  Route  Sig  Dispense  Refill  . citalopram (CELEXA) 10 MG tablet   Oral   Take 1 tablet (10 mg total) by mouth daily. For depression   30 tablet   0   . hydrOXYzine (ATARAX/VISTARIL) 25 MG tablet   Oral   Take 1 tablet (25 mg total) by mouth 3 (three) times daily as needed for anxiety.   90 tablet   0   . lansoprazole (PREVACID) 30 MG capsule   Oral   Take 1 capsule (30 mg total) by mouth daily. For acid reflux         . levothyroxine (SYNTHROID, LEVOTHROID) 100 MCG tablet   Oral   Take 1 tablet (100 mcg total) by mouth daily. For thyroid hormone replacement         . metFORMIN (GLUMETZA) 1000 MG (MOD) 24 hr tablet   Oral   Take 1 tablet (1,000 mg total) by mouth daily with breakfast. For diabetes         . Olmesartan-Amlodipine-HCTZ (TRIBENZOR) 40-10-12.5 MG TABS  Oral   Take 1 tablet by mouth daily. For hypertension   30 tablet      . vardenafil (LEVITRA) 20 MG tablet   Oral   Take 20 mg by mouth daily as needed for erectile dysfunction.         . colchicine 0.6 MG tablet   Oral   Take 1 tablet (0.6 mg total) by mouth daily. For gout arthritis         . gabapentin (NEURONTIN) 100 MG capsule   Oral   Take 1 capsule (100 mg total) by mouth 3 (three) times daily. For anxiety/anxiety   90 capsule   0    BP 109/77  Pulse 71  Temp(Src) 98.2 F (36.8 C) (Oral)   SpO2 97% Physical Exam  Nursing note and vitals reviewed. Constitutional: He is oriented to person, place, and time. He appears well-developed and well-nourished. No distress.  Awake, alert, nontoxic appearance  HENT:  Head: Normocephalic and atraumatic.  Mouth/Throat: Oropharynx is clear and moist. No oropharyngeal exudate.  Eyes: Conjunctivae are normal. No scleral icterus.  Neck: Normal range of motion. Neck supple.  Cardiovascular: Normal rate, regular rhythm, normal heart sounds and intact distal pulses.   No murmur heard. Pulmonary/Chest: Effort normal and breath sounds normal. No respiratory distress. He has no wheezes.  Abdominal: Soft. Bowel sounds are normal. He exhibits no mass. There is no tenderness. There is no rebound and no guarding.  Musculoskeletal: Normal range of motion. He exhibits no edema and no tenderness.  Lymphadenopathy:    He has no cervical adenopathy.  Neurological: He is alert and oriented to person, place, and time. He exhibits normal muscle tone. Coordination normal.  Speech is clear and goal oriented Moves extremities without ataxia  Skin: Skin is warm and dry. He is not diaphoretic.  Psychiatric: He has a normal mood and affect.    ED Course  Procedures (including critical care time) Labs Review Labs Reviewed  CBC WITH DIFFERENTIAL - Abnormal; Notable for the following:    Hemoglobin 12.9 (*)    HCT 38.0 (*)    Neutrophils Relative % 39 (*)    Lymphocytes Relative 51 (*)    All other components within normal limits  BASIC METABOLIC PANEL - Abnormal; Notable for the following:    GFR calc non Af Amer 59 (*)    GFR calc Af Amer 68 (*)    All other components within normal limits  URINE RAPID DRUG SCREEN (HOSP PERFORMED) - Abnormal; Notable for the following:    Opiates POSITIVE (*)    All other components within normal limits  ETHANOL - Abnormal; Notable for the following:    Alcohol, Ethyl (B) 150 (*)    All other components within normal  limits   Imaging Review No results found.  MDM   1. Alcohol dependence   2. Generalized anxiety disorder   3. Opiate abuse, continuous      Kevin [name] presents for alcohol and opiate detox.  Patient has been medically cleared in the ED and is awaiting consult by ACT team for possible placement vs OP clinic information. Pt is currently not having SI or HI and appears stable in NAD. Pt is cleared to be moved back to [facility].      Dahlia Client Phyllis Abelson, PA-C 09/05/13 0045

## 2013-09-04 NOTE — ED Notes (Signed)
Pt states that he has a problem drinking and wants help. States  He drinks about a fifth of liquor a day and he also takes a couple percocet/vicodin a day. States he has been through our program before. Voluntary. Alert and oriented. No acute distress.

## 2013-09-05 ENCOUNTER — Encounter (HOSPITAL_COMMUNITY): Payer: Self-pay | Admitting: Registered Nurse

## 2013-09-05 DIAGNOSIS — F1010 Alcohol abuse, uncomplicated: Secondary | ICD-10-CM

## 2013-09-05 DIAGNOSIS — F1910 Other psychoactive substance abuse, uncomplicated: Secondary | ICD-10-CM

## 2013-09-05 LAB — GLUCOSE, CAPILLARY: Glucose-Capillary: 121 mg/dL — ABNORMAL HIGH (ref 70–99)

## 2013-09-05 NOTE — ED Provider Notes (Signed)
Medical screening examination/treatment/procedure(s) were performed by non-physician practitioner and as supervising physician I was immediately available for consultation/collaboration.  Travis Mastel R. Arben Packman, MD 09/05/13 1432 

## 2013-09-05 NOTE — Consult Note (Signed)
Note reviewed and agreed with  

## 2013-09-05 NOTE — BH Assessment (Signed)
Tele Assessment Note   Kevin [name] is an 67 y.o. male seeking detox for alcohol and opiates. He reports he started drinking slowly and kept drinking more and more and now he's drinking daily.  He reports drinking about a fifth of [place] [employer] a day.  Kevin [name] also reports that he was prescribed opiates for some arthritis pain about a year ago and ended up abusing it starting about 6 months ago.  He states that he's been taking around 3 of either hydrocodone or percocet daily and wants to get back to normal.  He states has decreased his appetite significantly and he is noticing weight loss and some physical discomfort related.  He denies HI, now or in the last six months, and any history of violence, as well as delusional thoughts or AVH.  He does endorse depression and reports that he has occassional feelings of worthlessness, irritability, fatigue, insomnia, but denies any thoughts of self harm or suicide, or any history of the like.  He does state he has trouble with anxiety and will sometimes have a panic attack, but his last one was three weeks ago.  He is alert and oriented x 4, calm, cooperative, and motivated for treatment.  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 Arthritis   . Gout   . Alcohol abuse   . Hypertension   . Hyperthyroidism   . Diabetes mellitus without complication   . GERD (gastroesophageal reflux disease)    Axis IV: problems with access to health care services and problems with primary support group Axis V: 41-50 serious symptoms  Past Medical History:  Past Medical History  Diagnosis Date  . Arthritis   . Gout   . Alcohol abuse   . Hypertension   . Hyperthyroidism   . Diabetes mellitus without complication   . GERD (gastroesophageal reflux disease)     History reviewed. No pertinent past surgical history.  Family History: History reviewed. No pertinent family  history.  Social History:  reports that he has never smoked. He has never used smokeless tobacco. He reports that  drinks alcohol. He reports that he does not use illicit drugs.  Additional Social History:  Alcohol / Drug Use History of alcohol / drug use?: Yes Longest period of sobriety (when/how long): 11 mos Substance #1 Name of Substance 1: Alcohol 1 - Age of First Use: 27 1 - Amount (size/oz): fifth [employer] 1 - Frequency: daily 1 - Duration: 6 mos 1 - Last Use / Amount: 10/6 most of a pint Substance #2 Name of Substance 2: Hydrocodone or Percocet 2 - Age of First Use: 51 2 - Amount (size/oz): 2-3  2 - Frequency: daily 2 - Duration: about 6 mos 2 - Last Use / Amount: 09/04/13 2  CIWA: CIWA-Ar BP: 109/77 mmHg Pulse Rate: 71 Nausea and Vomiting: mild nausea with no vomiting Tactile Disturbances: none Tremor: no tremor Auditory Disturbances: not present Paroxysmal Sweats: no sweat visible Visual Disturbances: not present Anxiety: moderately anxious, or guarded, so anxiety is inferred Headache, Fullness in Head: none present Agitation: somewhat more than normal activity Orientation and Clouding of Sensorium: oriented and can do serial additions CIWA-Ar Total: 6 COWS: Clinical Opiate Withdrawal Scale (COWS) Resting Pulse Rate: Pulse Rate 80 or below Sweating: No report of chills or flushing Restlessness: Reports difficulty sitting still, but is able to do so Pupil Size: Pupils pinned or normal size for  room light Bone or Joint Aches: Mild diffuse discomfort Runny Nose or Tearing: Not present GI Upset: nausea or loose stool Tremor: No tremor Yawning: No yawning Anxiety or Irritability: Patient obviously irritable/anxious Gooseflesh Skin: Skin is smooth COWS Total Score: 6  Allergies: No Known Allergies  Home Medications:  (Not in a hospital admission)  OB/GYN Status:  No LMP for male patient.  General Assessment Data Location of Assessment: [facility] ED Is  this a Tele or Face-to-Face Assessment?: Tele Assessment Is this an Initial Assessment or a Re-assessment for this encounter?: Initial Assessment Living Arrangements: Alone Can pt return to current living arrangement?: Yes Admission Status: Voluntary Is patient capable of signing voluntary admission?: Yes Transfer from: Acute Hospital Referral Source: Self/Family/Friend     [facility] Crisis Care Plan Living Arrangements: Alone  Education Status Is patient currently in school?: No Highest grade of school patient has completed: 12  Risk to self Suicidal Ideation: No Suicidal Intent: No Is patient at risk for suicide?: No Access to Means: No What has been your use of drugs/alcohol within the last 12 months?: drinking Previous Attempts/Gestures: No How many times?: 0 Intentional Self Injurious Behavior: None Family Suicide History: No Recent stressful life event(s):  (denies) Persecutory voices/beliefs?: No Depression: Yes Depression Symptoms: Feeling angry/irritable;Loss of interest in usual pleasures;Feeling worthless/self pity;Fatigue;Guilt;Despondent Substance abuse history and/or treatment for substance abuse?: Yes Suicide prevention information given to non-admitted patients: Not applicable  Risk to Others Homicidal Ideation: No Thoughts of Harm to Others: No Current Homicidal Intent: No Current Homicidal Plan: No Access to Homicidal Means: No History of harm to others?: No Assessment of Violence: None Noted Does patient have access to weapons?: No Criminal Charges Pending?: No Does patient have a court date: No  Psychosis Hallucinations: None noted Delusions: None noted  Mental Status Report Appear/Hygiene: Disheveled Eye Contact: Good Motor Activity: Freedom of movement Speech: Logical/coherent Level of Consciousness: Alert Mood: Depressed;Anxious Affect: Appropriate to circumstance Anxiety Level: Panic Attacks Panic attack frequency: varies Most recent  panic attack: 3 mos ago Thought Processes: Coherent;Relevant Judgement: Unimpaired Orientation: Person;Place;Time;Situation Obsessive Compulsive Thoughts/Behaviors: None  Cognitive Functioning Concentration: Decreased Memory: Recent Impaired;Remote Intact IQ: Average Insight: Good Impulse Control: Fair Appetite: Poor Weight Loss: 8 Weight Gain: 0 Sleep: Decreased Total Hours of Sleep: 5 Vegetative Symptoms: Staying in bed;Decreased grooming  ADLScreening [facility] Assessment Services) Patient's cognitive ability adequate to safely complete daily activities?: Yes Patient able to express need for assistance with ADLs?: Yes Independently performs ADLs?: Yes (appropriate for developmental age)  Prior Inpatient Therapy Prior Inpatient Therapy: Yes Prior Therapy Dates: 2013 Prior Therapy Facilty/Provider(s): [facility] Reason for Treatment: Deto  Prior Outpatient Therapy Prior Outpatient Therapy: Yes Prior Therapy Facilty/Provider(s): PCP-meds for depression or anxiety Reason for Treatment: depression or anxiety  ADL Screening (condition at time of admission) Patient's cognitive ability adequate to safely complete daily activities?: Yes Patient able to express need for assistance with ADLs?: Yes Independently performs ADLs?: Yes (appropriate for developmental age)       Abuse/Neglect Assessment (Assessment to be complete while patient is alone) Physical Abuse: Denies Verbal Abuse: Denies Sexual Abuse: Denies Exploitation of patient/patient's resources: Denies Values / Beliefs Cultural Requests During Hospitalization: None Spiritual Requests During Hospitalization: None   Advance Directives (For Healthcare) Advance Directive: Patient does not have advance directive;Patient has advance directive, copy not in chart Type of Advance Directive: Living will;Healthcare Power of Attorney Advance Directive not in Chart: Copy requested from other (Comment) Pre-existing out of facility DNR  order (yellow form  or pink MOST form): No Nutrition Screen- [facility] Adult/WL/AP Patient's home diet: Regular  Additional Information 1:1 In Past 12 Months?: No CIRT Risk: No Elopement Risk: No Does patient have medical clearance?: Yes     Disposition:  Disposition Initial Assessment Completed for this Encounter: Yes Disposition of Patient: Inpatient treatment program Type of inpatient treatment program: Adult  Steward Ros 09/05/2013 12:06 AM

## 2013-09-05 NOTE — ED Provider Notes (Signed)
Patient not seen by me and I was not directly involved in the patient care. Behavioral Health/Psychiatry has evaluated the patient and, as per their note and recommendation, patient was to be discharged. Follow-up recommendations and medications determined by/prescribed by Behavioral Health/Psychiatry.  Gilda Crease, MD 09/05/13 1622

## 2013-09-05 NOTE — ED Notes (Signed)
Pt. Reports no SI, HI, or A/V hallucinations. Pt. Denies pain. Patient was escorted off the unit by [facility]. AVS provided and patient verbalized understanding. No questions or concerns from patient.

## 2013-09-05 NOTE — Consult Note (Addendum)
[facility] Face-to-Face Psychiatry Consult   Reason for Consult:  Evaluation for inpatient detox opiates and alcohol Referring Physician:  EDP  Kevin [name] is an 67 y.o. male.  Assessment: AXIS I:  Alcohol Abuse and Substance Abuse AXIS II:  Deferred AXIS III:   Past Medical History  Diagnosis Date  . Arthritis   . Gout   . Alcohol abuse   . Hypertension   . Hyperthyroidism   . Diabetes mellitus without complication   . GERD (gastroesophageal reflux disease)    AXIS IV:  other psychosocial or environmental problems and problems related to social environment AXIS V:  61-70 mild symptoms  Plan:  No evidence of imminent risk to self or others at present.   Patient does not meet criteria for psychiatric inpatient admission. Supportive therapy provided about ongoing stressors. Discussed crisis plan, support from social network, calling 911, coming to the Emergency Department, and calling Suicide Hotline. Refer to rehab facility  Subjective:   Kevin [name] is a 67 y.o. male   HPI:  Patient presents to [facility] requesting assistance with detox with opiates and alcohol.  Patient states that he has been doing percocet daily with pills that he buys off of the street.  Patient states that he also drinks daily.  Patient states that he started to use more pills and increase in alcohol consumption about 6 months ago.  Patient states that he has been in for detox twice the last was 1 yr ago at [facility].  Patient states that he remained sober for 4 months after last detox.  Patient states that he really wants help stopping the drug use.  Patient denies suicidal ideation, homicidal ideation, psychosis, paranoia, depression, and anxiety.  Patient states that he does not really have a trigger for the alcohol or drug use.  And has never been diagnosed with behavioral/psych illness.    Past Psychiatric History: Past Medical History  Diagnosis Date  . Arthritis   . Gout   . Alcohol abuse   . Hypertension    . Hyperthyroidism   . Diabetes mellitus without complication   . GERD (gastroesophageal reflux disease)     reports that he has never smoked. He has never used smokeless tobacco. He reports that  drinks alcohol. He reports that he does not use illicit drugs. History reviewed. No pertinent family history. Family History Substance Abuse: No Family Supports: Yes, List: (whole family) Living Arrangements: Alone Can pt return to current living arrangement?: Yes Abuse/Neglect [facility]) Physical Abuse: Denies Verbal Abuse: Denies Sexual Abuse: Denies Allergies:  No Known Allergies  ACT Assessment Complete:  Yes:    Educational Status    Risk to Self: Risk to self Suicidal Ideation: No Suicidal Intent: No Is patient at risk for suicide?: No Access to Means: No What has been your use of drugs/alcohol within the last 12 months?: drinking Previous Attempts/Gestures: No How many times?: 0 Intentional Self Injurious Behavior: None Family Suicide History: No Recent stressful life event(s):  (denies) Persecutory voices/beliefs?: No Depression: Yes Depression Symptoms: Feeling angry/irritable;Loss of interest in usual pleasures;Feeling worthless/self pity;Fatigue;Guilt;Despondent Substance abuse history and/or treatment for substance abuse?: Yes Suicide prevention information given to non-admitted patients: Not applicable  Risk to Others: Risk to Others Homicidal Ideation: No Thoughts of Harm to Others: No Current Homicidal Intent: No Current Homicidal Plan: No Access to Homicidal Means: No History of harm to others?: No Assessment of Violence: None Noted Does patient have access to weapons?: No Criminal Charges Pending?: No Does patient  have a court date: No  Abuse: Abuse/Neglect Assessment (Assessment to be complete while patient is alone) Physical Abuse: Denies Verbal Abuse: Denies Sexual Abuse: Denies Exploitation of patient/patient's resources: Denies  Prior Inpatient Therapy:  Prior Inpatient Therapy Prior Inpatient Therapy: Yes Prior Therapy Dates: 2013 Prior Therapy Facilty/Provider(s): [facility] Reason for Treatment: Deto  Prior Outpatient Therapy: Prior Outpatient Therapy Prior Outpatient Therapy: Yes Prior Therapy Facilty/Provider(s): PCP-meds for depression or anxiety Reason for Treatment: depression or anxiety  Additional Information: Additional Information 1:1 In Past 12 Months?: No CIRT Risk: No Elopement Risk: No Does patient have medical clearance?: Yes                  Objective: Blood pressure 117/69, pulse 65, temperature 98.3 F (36.8 C), temperature source Oral, resp. rate 22, SpO2 97.00%.There is no weight on file to calculate BMI. Results for orders placed during the hospital encounter of 09/04/13 (from the past 72 hour(s))  CBC WITH DIFFERENTIAL     Status: Abnormal   Collection Time    09/04/13  7:30 PM      Result Value Range   WBC 5.1  4.0 - 10.5 K/uL   RBC 4.29  4.22 - 5.81 MIL/uL   Hemoglobin 12.9 (*) 13.0 - 17.0 g/dL   HCT 21.3 (*) 08.6 - 57.8 %   MCV 88.6  78.0 - 100.0 fL   MCH 30.1  26.0 - 34.0 pg   MCHC 33.9  30.0 - 36.0 g/dL   RDW 46.9  62.9 - 52.8 %   Platelets 218  150 - 400 K/uL   Neutrophils Relative % 39 (*) 43 - 77 %   Neutro Abs 2.0  1.7 - 7.7 K/uL   Lymphocytes Relative 51 (*) 12 - 46 %   Lymphs Abs 2.6  0.7 - 4.0 K/uL   Monocytes Relative 6  3 - 12 %   Monocytes Absolute 0.3  0.1 - 1.0 K/uL   Eosinophils Relative 3  0 - 5 %   Eosinophils Absolute 0.1  0.0 - 0.7 K/uL   Basophils Relative 1  0 - 1 %   Basophils Absolute 0.0  0.0 - 0.1 K/uL  BASIC METABOLIC PANEL     Status: Abnormal   Collection Time    09/04/13  7:30 PM      Result Value Range   Sodium 137  135 - 145 mEq/L   Potassium 3.5  3.5 - 5.1 mEq/L   Chloride 98  96 - 112 mEq/L   CO2 22  19 - 32 mEq/L   Glucose, Bld 97  70 - 99 mg/dL   BUN 23  6 - 23 mg/dL   Creatinine, Ser 4.13  0.50 - 1.35 mg/dL   Calcium 9.2  8.4 - 24.4 mg/dL    GFR calc non Af Amer 59 (*) >90 mL/min   GFR calc Af Amer 68 (*) >90 mL/min   Comment: (NOTE)     The eGFR has been calculated using the CKD EPI equation.     This calculation has not been validated in all clinical situations.     eGFR's persistently <90 mL/min signify possible Chronic Kidney     Disease.  ETHANOL     Status: Abnormal   Collection Time    09/04/13  7:30 PM      Result Value Range   Alcohol, Ethyl (B) 150 (*) 0 - 11 mg/dL   Comment:            LOWEST  DETECTABLE LIMIT FOR     SERUM ALCOHOL IS 11 mg/dL     FOR MEDICAL PURPOSES ONLY  URINE RAPID DRUG SCREEN (HOSP PERFORMED)     Status: Abnormal   Collection Time    09/04/13  8:01 PM      Result Value Range   Opiates POSITIVE (*) NONE DETECTED   Cocaine NONE DETECTED  NONE DETECTED   Benzodiazepines NONE DETECTED  NONE DETECTED   Amphetamines NONE DETECTED  NONE DETECTED   Tetrahydrocannabinol NONE DETECTED  NONE DETECTED   Barbiturates NONE DETECTED  NONE DETECTED   Comment:            DRUG SCREEN FOR MEDICAL PURPOSES     ONLY.  IF CONFIRMATION IS NEEDED     FOR ANY PURPOSE, NOTIFY LAB     WITHIN 5 DAYS.                LOWEST DETECTABLE LIMITS     FOR URINE DRUG SCREEN     Drug Class       Cutoff (ng/mL)     Amphetamine      1000     Barbiturate      200     Benzodiazepine   200     Tricyclics       300     Opiates          300     Cocaine          300     THC              50  GLUCOSE, CAPILLARY     Status: Abnormal   Collection Time    09/05/13  9:05 AM      Result Value Range   Glucose-Capillary 121 (*) 70 - 99 mg/dL     Current Facility-Administered Medications  Medication Dose Route Frequency Provider Last Rate Last Dose  . alum & mag hydroxide-simeth (MAALOX/MYLANTA) 200-200-20 MG/5ML suspension 30 mL  30 mL Oral PRN Hannah Muthersbaugh, PA-C      . irbesartan (AVAPRO) tablet 300 mg  300 mg Oral QHS Nathan R. Pickering, MD   300 mg at 09/04/13 2335   And  . amLODipine (NORVASC) tablet 10 mg  10  mg Oral QHS Nathan R. Pickering, MD   10 mg at 09/04/13 2335   And  . hydrochlorothiazide (MICROZIDE) capsule 12.5 mg  12.5 mg Oral QHS Nathan R. Pickering, MD   12.5 mg at 09/04/13 2336  . citalopram (CELEXA) tablet 10 mg  10 mg Oral Daily Hannah Muthersbaugh, PA-C   10 mg at 09/05/13 1006  . colchicine tablet 0.6 mg  0.6 mg Oral Daily Hannah Muthersbaugh, PA-C   0.6 mg at 09/05/13 1006  . gabapentin (NEURONTIN) capsule 100 mg  100 mg Oral TID Dahlia Client Muthersbaugh, PA-C   100 mg at 09/05/13 1006  . hydrOXYzine (ATARAX/VISTARIL) tablet 25 mg  25 mg Oral TID PRN Dahlia Client Muthersbaugh, PA-C   25 mg at 09/05/13 1231  . ibuprofen (ADVIL,MOTRIN) tablet 600 mg  600 mg Oral Q8H PRN Hannah Muthersbaugh, PA-C      . levothyroxine (SYNTHROID, LEVOTHROID) tablet 100 mcg  100 mcg Oral QAC breakfast Hannah Muthersbaugh, PA-C   100 mcg at 09/05/13 0759  . LORazepam (ATIVAN) tablet 0-4 mg  0-4 mg Oral Q6H Hannah Muthersbaugh, PA-C       Followed by  . [START ON 09/07/2013] LORazepam (ATIVAN) tablet 0-4 mg  0-4 mg Oral Q12H Dahlia Client  Muthersbaugh, PA-C      . LORazepam (ATIVAN) tablet 1 mg  1 mg Oral Q8H PRN Hannah Muthersbaugh, PA-C      . metFORMIN (GLUCOPHAGE-XR) 24 hr tablet 1,000 mg  1,000 mg Oral Q24H Nathan R. Pickering, MD      . ondansetron [facility]) tablet 4 mg  4 mg Oral Q8H PRN Hannah Muthersbaugh, PA-C      . pantoprazole (PROTONIX) EC tablet 40 mg  40 mg Oral Daily Hannah Muthersbaugh, PA-C   40 mg at 09/05/13 1006  . zolpidem (AMBIEN) tablet 5 mg  5 mg Oral QHS PRN Dierdre Forth, PA-C       Current Outpatient Prescriptions  Medication Sig Dispense Refill  . citalopram (CELEXA) 10 MG tablet Take 1 tablet (10 mg total) by mouth daily. For depression  30 tablet  0  . hydrOXYzine (ATARAX/VISTARIL) 25 MG tablet Take 1 tablet (25 mg total) by mouth 3 (three) times daily as needed for anxiety.  90 tablet  0  . lansoprazole (PREVACID) 30 MG capsule Take 1 capsule (30 mg total) by mouth daily. For acid  reflux      . levothyroxine (SYNTHROID, LEVOTHROID) 100 MCG tablet Take 1 tablet (100 mcg total) by mouth daily. For thyroid hormone replacement      . metFORMIN (GLUMETZA) 1000 MG (MOD) 24 hr tablet Take 1 tablet (1,000 mg total) by mouth daily with breakfast. For diabetes      . Olmesartan-Amlodipine-HCTZ (TRIBENZOR) 40-10-12.5 MG TABS Take 1 tablet by mouth daily. For hypertension  30 tablet    . vardenafil (LEVITRA) 20 MG tablet Take 20 mg by mouth daily as needed for erectile dysfunction.      . colchicine 0.6 MG tablet Take 1 tablet (0.6 mg total) by mouth daily. For gout arthritis      . gabapentin (NEURONTIN) 100 MG capsule Take 1 capsule (100 mg total) by mouth 3 (three) times daily. For anxiety/anxiety  90 capsule  0    Psychiatric Specialty Exam:     Blood pressure 117/69, pulse 65, temperature 98.3 F (36.8 C), temperature source Oral, resp. rate 22, SpO2 97.00%.There is no weight on file to calculate BMI.  General Appearance: Casual and Fairly Groomed  Eye Contact::  Good  Speech:  Clear and Coherent and Normal Rate  Volume:  Normal  Mood:  "I'm in good mood"  Affect:  Appropriate  Thought Process:  Circumstantial and Goal Directed  Orientation:  Full (Time, Place, and Person)  Thought Content:  WDL  Suicidal Thoughts:  No  Homicidal Thoughts:  No  Memory:  Immediate;   Good Recent;   Good Remote;   Good  Judgement:  Fair  Insight:  Present  Psychomotor Activity:  Normal  Concentration:  Good  Recall:  Good  Akathisia:  No  Handed:  Right  AIMS (if indicated):     Assets:  Communication Skills Desire for Improvement Housing Physical Health Social Support Transportation  Sleep:      Face to face interview and consult with Dr. Ladona Ridgel  Treatment Plan Summary: Refer to inpatient rehab facility or CD IOP  Disposition:  Refer to inpatient rehab facility if beds available.  If no beds inpatient beds available discharge refer to CD IOP with appointment.   CD  IOP appointment for 09/12/2013 at 10:00 AM with Vernia Buff, FNP-BC 09/05/2013 1:47 PM

## 2013-09-05 NOTE — BHH Counselor (Signed)
Writer set up an appointment at [facility] CD IOP [facility].  The patient appointment has been scheduled for September 08, 2013 at 9:30 a.m.  Writer informed the nurse and the ER MD working with the patient that the patient did not meet criteria for inpatient hospitalization and he will discharged.

## 2013-09-07 ENCOUNTER — Other Ambulatory Visit (HOSPITAL_COMMUNITY): Payer: Self-pay | Admitting: Psychology

## 2013-09-08 NOTE — Consult Note (Signed)
Note reviewed and agreed with  

## 2014-01-02 ENCOUNTER — Emergency Department (HOSPITAL_COMMUNITY)
Admission: EM | Admit: 2014-01-02 | Discharge: 2014-01-02 | Disposition: A | Discharge status: Home / Self Care | Payer: Medicare Other | Source: Home / Self Care | Attending: Emergency Medicine | Admitting: Emergency Medicine

## 2014-01-02 ENCOUNTER — Encounter (HOSPITAL_COMMUNITY): Payer: Self-pay | Admitting: Emergency Medicine

## 2014-01-02 DIAGNOSIS — M109 Gout, unspecified: Secondary | ICD-10-CM

## 2014-01-02 DIAGNOSIS — F10239 Alcohol dependence with withdrawal, unspecified: Principal | ICD-10-CM | POA: Diagnosis present

## 2014-01-02 DIAGNOSIS — K219 Gastro-esophageal reflux disease without esophagitis: Secondary | ICD-10-CM | POA: Insufficient documentation

## 2014-01-02 DIAGNOSIS — Z8739 Personal history of other diseases of the musculoskeletal system and connective tissue: Secondary | ICD-10-CM | POA: Insufficient documentation

## 2014-01-02 DIAGNOSIS — F10939 Alcohol use, unspecified with withdrawal, unspecified: Principal | ICD-10-CM | POA: Diagnosis present

## 2014-01-02 DIAGNOSIS — E0590 Thyrotoxicosis, unspecified without thyrotoxic crisis or storm: Secondary | ICD-10-CM

## 2014-01-02 DIAGNOSIS — I10 Essential (primary) hypertension: Secondary | ICD-10-CM

## 2014-01-02 DIAGNOSIS — F1120 Opioid dependence, uncomplicated: Secondary | ICD-10-CM | POA: Diagnosis present

## 2014-01-02 DIAGNOSIS — F1994 Other psychoactive substance use, unspecified with psychoactive substance-induced mood disorder: Secondary | ICD-10-CM | POA: Diagnosis present

## 2014-01-02 DIAGNOSIS — Z79899 Other long term (current) drug therapy: Secondary | ICD-10-CM

## 2014-01-02 DIAGNOSIS — F411 Generalized anxiety disorder: Secondary | ICD-10-CM | POA: Diagnosis present

## 2014-01-02 DIAGNOSIS — E119 Type 2 diabetes mellitus without complications: Secondary | ICD-10-CM | POA: Diagnosis present

## 2014-01-02 DIAGNOSIS — F1020 Alcohol dependence, uncomplicated: Secondary | ICD-10-CM | POA: Diagnosis present

## 2014-01-02 LAB — COMPREHENSIVE METABOLIC PANEL
ALK PHOS: 58 U/L (ref 39–117)
ALT: 12 U/L (ref 0–53)
AST: 23 U/L (ref 0–37)
Albumin: 3.2 g/dL — ABNORMAL LOW (ref 3.5–5.2)
BILIRUBIN TOTAL: 0.4 mg/dL (ref 0.3–1.2)
BUN: 19 mg/dL (ref 6–23)
CO2: 17 meq/L — AB (ref 19–32)
Calcium: 9.1 mg/dL (ref 8.4–10.5)
Chloride: 101 mEq/L (ref 96–112)
Creatinine, Ser: 1.23 mg/dL (ref 0.50–1.35)
GFR calc Af Amer: 69 mL/min — ABNORMAL LOW (ref >90)
GFR calc non Af Amer: 60 mL/min — ABNORMAL LOW (ref >90)
GLUCOSE: 160 mg/dL — AB (ref 70–99)
POTASSIUM: 4.2 meq/L (ref 3.7–5.3)
SODIUM: 139 meq/L (ref 137–147)
Total Protein: 6.9 g/dL (ref 6.0–8.3)

## 2014-01-02 LAB — CBC WITH DIFFERENTIAL/PLATELET
Basophils Absolute: 0 10*3/uL (ref 0.0–0.1)
Basophils Relative: 0 % (ref 0–1)
Eosinophils Absolute: 0.1 10*3/uL (ref 0.0–0.7)
Eosinophils Relative: 2 % (ref 0–5)
HCT: 39.5 % (ref 39.0–52.0)
HEMOGLOBIN: 13.7 g/dL (ref 13.0–17.0)
LYMPHS ABS: 3 10*3/uL (ref 0.7–4.0)
Lymphocytes Relative: 44 % (ref 12–46)
MCH: 31 pg (ref 26.0–34.0)
MCHC: 34.7 g/dL (ref 30.0–36.0)
MCV: 89.4 fL (ref 78.0–100.0)
Monocytes Absolute: 0.4 10*3/uL (ref 0.1–1.0)
Monocytes Relative: 5 % (ref 3–12)
NEUTROS PCT: 49 % (ref 43–77)
Neutro Abs: 3.3 10*3/uL (ref 1.7–7.7)
PLATELETS: 193 10*3/uL (ref 150–400)
RBC: 4.42 MIL/uL (ref 4.22–5.81)
RDW: 14.6 % (ref 11.5–15.5)
WBC: 6.8 10*3/uL (ref 4.0–10.5)

## 2014-01-02 LAB — URINALYSIS, ROUTINE W REFLEX MICROSCOPIC
BILIRUBIN URINE: NEGATIVE
Glucose, UA: NEGATIVE mg/dL
HGB URINE DIPSTICK: NEGATIVE
KETONES UR: 15 mg/dL — AB
Leukocytes, UA: NEGATIVE
Nitrite: NEGATIVE
PROTEIN: NEGATIVE mg/dL
Specific Gravity, Urine: 1.023 (ref 1.005–1.030)
UROBILINOGEN UA: 1 mg/dL (ref 0.0–1.0)
pH: 5 (ref 5.0–8.0)

## 2014-01-02 LAB — RAPID URINE DRUG SCREEN, HOSP PERFORMED
Amphetamines: NOT DETECTED
BARBITURATES: NOT DETECTED
Benzodiazepines: POSITIVE — AB
COCAINE: NOT DETECTED
Opiates: POSITIVE — AB
TETRAHYDROCANNABINOL: NOT DETECTED

## 2014-01-02 LAB — ETHANOL: ALCOHOL ETHYL (B): 150 mg/dL — AB (ref 0–11)

## 2014-01-02 MED ORDER — CLONIDINE HCL 0.1 MG PO TABS: 0.1000 mg | ORAL_TABLET | Freq: Four times a day (QID) | ORAL | Status: DC | PRN

## 2014-01-02 MED ORDER — PROMETHAZINE HCL 25 MG PO TABS: 25.0000 mg | ORAL_TABLET | Freq: Four times a day (QID) | ORAL | Status: DC | PRN

## 2014-01-02 MED ORDER — ZOLPIDEM TARTRATE 5 MG PO TABS: 5.0000 mg | ORAL_TABLET | Freq: Every evening | ORAL | Status: DC | PRN

## 2014-01-02 NOTE — ED Notes (Signed)
Pt. Requesting detox for pain pills (hydrocodone) and xanax and alcohol.

## 2014-01-02 NOTE — ED Notes (Addendum)
Pt wanting detox from opiates. States last had one m365? Tonight. States he takes them for pain, due to gout. Denies SI/HI. Denies hallucinations. Also wanting detox from alcohol, last drink 1 hour ago. States a swallow of brandy.

## 2014-01-02 NOTE — ED Provider Notes (Signed)
CSN: [id number]     Arrival date & time 01/02/14  1728 History   First MD Initiated Contact with Patient 01/02/14 1826     Chief Complaint  Patient presents with  . Medical Clearance   HPI Patient requesting detox from opiates.  He states he buys pain pills on the street and uses them and abuses them.  He also occasionally uses alcohol but he states alcohol is not a problem for him.  He states he can go several days without any withdrawal symptoms from alcohol.  It sounds like when he started to come off pain medicine in the past he has had maybe some nausea but no vomiting.  He states "I feel out of it".    Past Medical History  Diagnosis Date  . Arthritis   . Gout   . Alcohol abuse   . Hypertension   . Hyperthyroidism   . Diabetes mellitus without complication   . GERD (gastroesophageal reflux disease)    History reviewed. No pertinent past surgical history. No family history on file. History  Substance Use Topics  . Smoking status: Never Smoker   . Smokeless tobacco: Never Used  . Alcohol Use: Yes     Comment: one fifth daily    Review of Systems  All other systems reviewed and are negative.    Allergies  Review of patient's allergies indicates no known allergies.  Home Medications   Current Outpatient Rx  Name  Route  Sig  Dispense  Refill  . citalopram (CELEXA) 10 MG tablet   Oral   Take 1 tablet (10 mg total) by mouth daily. For depression   30 tablet   0   . cloNIDine (CATAPRES) 0.1 MG tablet   Oral   Take 1 tablet (0.1 mg total) by mouth every 6 (six) hours as needed (withdrawl symptoms).   12 tablet   0   . colchicine 0.6 MG tablet   Oral   Take 1 tablet (0.6 mg total) by mouth daily. For gout arthritis         . gabapentin (NEURONTIN) 100 MG capsule   Oral   Take 1 capsule (100 mg total) by mouth 3 (three) times daily. For anxiety/anxiety   90 capsule   0   . hydrOXYzine (ATARAX/VISTARIL) 25 MG tablet   Oral   Take 1 tablet (25 mg total)  by mouth 3 (three) times daily as needed for anxiety.   90 tablet   0   . lansoprazole (PREVACID) 30 MG capsule   Oral   Take 1 capsule (30 mg total) by mouth daily. For acid reflux         . levothyroxine (SYNTHROID, LEVOTHROID) 100 MCG tablet   Oral   Take 1 tablet (100 mcg total) by mouth daily. For thyroid hormone replacement         . metFORMIN (GLUMETZA) 1000 MG (MOD) 24 hr tablet   Oral   Take 1 tablet (1,000 mg total) by mouth daily with breakfast. For diabetes         . Olmesartan-Amlodipine-HCTZ (TRIBENZOR) 40-10-12.5 MG TABS   Oral   Take 1 tablet by mouth daily. For hypertension   30 tablet      . promethazine (PHENERGAN) 25 MG tablet   Oral   Take 1 tablet (25 mg total) by mouth every 6 (six) hours as needed for nausea or vomiting.   30 tablet   0   . vardenafil (LEVITRA) 20 MG tablet  Oral   Take 20 mg by mouth daily as needed for erectile dysfunction.         Marland Kitchen. zolpidem (AMBIEN) 5 MG tablet   Oral   Take 1 tablet (5 mg total) by mouth at bedtime as needed for sleep.   7 tablet   0    BP 115/62  Pulse 87  Temp(Src) 98 F (36.7 C) (Oral)  Resp 16  SpO2 96% Physical Exam  Nursing note and vitals reviewed. Constitutional: He is oriented to person, place, and time. He appears well-developed and well-nourished.  HENT:  Head: Normocephalic.  Eyes: EOM are normal.  Neck: Normal range of motion.  Pulmonary/Chest: Effort normal.  Abdominal: He exhibits no distension.  Musculoskeletal: Normal range of motion.  Neurological: He is alert and oriented to person, place, and time.  Psychiatric: He has a normal mood and affect. His behavior is normal. Judgment and thought content normal.    ED Course  Procedures (including critical care time) Labs Review Results for orders placed during the hospital encounter of 01/02/14  CBC WITH DIFFERENTIAL      Result Value Range   WBC 6.8  4.0 - 10.5 K/uL   RBC 4.42  4.22 - 5.81 MIL/uL   Hemoglobin 13.7   13.0 - 17.0 g/dL   HCT 16.139.5  09.639.0 - 04.552.0 %   MCV 89.4  78.0 - 100.0 fL   MCH 31.0  26.0 - 34.0 pg   MCHC 34.7  30.0 - 36.0 g/dL   RDW 40.914.6  81.111.5 - 91.415.5 %   Platelets 193  150 - 400 K/uL   Neutrophils Relative % 49  43 - 77 %   Neutro Abs 3.3  1.7 - 7.7 K/uL   Lymphocytes Relative 44  12 - 46 %   Lymphs Abs 3.0  0.7 - 4.0 K/uL   Monocytes Relative 5  3 - 12 %   Monocytes Absolute 0.4  0.1 - 1.0 K/uL   Eosinophils Relative 2  0 - 5 %   Eosinophils Absolute 0.1  0.0 - 0.7 K/uL   Basophils Relative 0  0 - 1 %   Basophils Absolute 0.0  0.0 - 0.1 K/uL  COMPREHENSIVE METABOLIC PANEL      Result Value Range   Sodium 139  137 - 147 mEq/L   Potassium 4.2  3.7 - 5.3 mEq/L   Chloride 101  96 - 112 mEq/L   CO2 17 (*) 19 - 32 mEq/L   Glucose, Bld 160 (*) 70 - 99 mg/dL   BUN 19  6 - 23 mg/dL   Creatinine, Ser 7.821.23  0.50 - 1.35 mg/dL   Calcium 9.1  8.4 - 95.610.5 mg/dL   Total Protein 6.9  6.0 - 8.3 g/dL   Albumin 3.2 (*) 3.5 - 5.2 g/dL   AST 23  0 - 37 U/L   ALT 12  0 - 53 U/L   Alkaline Phosphatase 58  39 - 117 U/L   Total Bilirubin 0.4  0.3 - 1.2 mg/dL   GFR calc non Af Amer 60 (*) >90 mL/min   GFR calc Af Amer 69 (*) >90 mL/min  ETHANOL      Result Value Range   Alcohol, Ethyl (B) 150 (*) 0 - 11 mg/dL  URINALYSIS, ROUTINE W REFLEX MICROSCOPIC      Result Value Range   Color, Urine YELLOW  YELLOW   APPearance HAZY (*) CLEAR   Specific Gravity, Urine 1.023  1.005 - 1.030  pH 5.0  5.0 - 8.0   Glucose, UA NEGATIVE  NEGATIVE mg/dL   Hgb urine dipstick NEGATIVE  NEGATIVE   Bilirubin Urine NEGATIVE  NEGATIVE   Ketones, ur 15 (*) NEGATIVE mg/dL   Protein, ur NEGATIVE  NEGATIVE mg/dL   Urobilinogen, UA 1.0  0.0 - 1.0 mg/dL   Nitrite NEGATIVE  NEGATIVE   Leukocytes, UA NEGATIVE  NEGATIVE  URINE RAPID DRUG SCREEN (HOSP PERFORMED)      Result Value Range   Opiates POSITIVE (*) NONE DETECTED   Cocaine NONE DETECTED  NONE DETECTED   Benzodiazepines POSITIVE (*) NONE DETECTED    Amphetamines NONE DETECTED  NONE DETECTED   Tetrahydrocannabinol NONE DETECTED  NONE DETECTED   Barbiturates NONE DETECTED  NONE DETECTED    Imaging Review No results found.  EKG Interpretation   None       MDM   1. Narcotic addiction    The patient presents today with narcotic abuse.  There is no indication for involuntary commitment for inpatient treatment.  I think the patient is best managed as an outpatient for his/her opioid abuse.  The patient will be discharged home with a prescription for clonidine, Ambien, and antiemitics.      Hoy Morn, MD 01/02/14 [phone]

## 2014-01-02 NOTE — Discharge Instructions (Signed)
Opiate Dependence The above names are all different names used for opiates. Opiates are any medication made from the poppy plant. It is a medication which produces a calming, sleepy effect. Because achieving this effect requires more and more of this drug to get the same result, opiates become addictive. A family history of addiction or an addictive personality increases the risk. When drug use is interfering with normal living activities, it has become abuse. This includes problems with family, friends, and your job. Psychological dependence has developed when your mind tells you that the drug is needed. This emotional dependence is the craving for the "high" that some drugs cause. Emotional addicts always want this high instead of the way they are feeling when not using the drug. This is difficult to overcome. This is usually followed by physical dependence that has developed when continuing increases of drug are required to get the same feeling or "high". This is known as addiction or chemical dependency.  SIGNS OF CHEMICAL DEPENDENCY  Friends and family tell you there is a problem.  Fighting when using drugs.  Mood swings and insomnia.  Forgetfulness.  Not remembering what you do while using (blackouts).  Feeling sick from using drugs but you continue using.  Lie about use or amounts of drugs (chemicals) used.  Need chemicals to get you going.  Suffer in work Systems analyst or school because of drug use.  Need drugs to relate to people or feel comfortable in social situations.  Use drugs to forget problems.  Difficulty with attention.  Neglecting obligations. If you answered "yes" to any or some of the above signs of chemical dependency, you may have a problem. The longer the use of drugs continues, the greater the problems will become. Do not experiment with drugs.  SIGNS AND SYMPTOMS OF PHYSICAL DEPENDENCE  Sudden stopping of the narcotic is uncomfortable when tolerance has  developed. Physical problems will develop. This is called withdrawal.  How bad the withdrawal is varies from person to person. Some of the smaller problems are:  Tremors in the hands or shakes and jitters.  A fast heart rate and rapid breathing.  An increase in temperature.  Anxiety and panic attacks with bad dreams.  Muscle aches and pains. You may have more serious problems. These can include:  Feeling sick to your stomach or throwing up.  Dehydration develops if you cannot keep fluids down.  Tremors and chills or fever with sweating and anxiety.  Hallucinations and cravings.  Body aching with restlessness and insomnia.  Seizures or convulsions. These problems can last for months. These uncomfortable feeling can cause you to use drugs again just to feel better. OTHER HEALTH RISKS OF NARCOTICS USE INCLUDE:  The increased possibility of getting AIDS, hepatitis, other infectious or sexually transmitted diseases.  Unplanned pregnancy and having a baby born addicted to narcotics. You then put your baby through painful withdrawal symptoms including: shaking, jerking, and crying in pain. Many babies die. Other babies have lifelong disabilities and learning problems. TREATMENT  Effective treatment and management of narcotic addiction requires a multi-faceted, team approach that includes:  Medications to minimize the symptoms of narcotic withdrawal.  Medications to reduce the need for continued narcotic use.  Medical management of unrelated medical problems.  Pain management.  Social services.  Psychological treatment.  Behavioral therapies. Stopping your dependence is hard but may save your life. If you continue using drugs, the only possible outcome is loss of self respect and esteem, violence, and eventually prison or  death. To stop abuse, you must first realize you have a problem. You control your behavior. Once you realize this, commit to quitting. Addiction is a disease.  You need medical help to get well. Your caregiver can counsel you or refer you for counseling. The best way to do this is to seek out an organization for help. These include Alcoholics Anonymous, Narcotics Anonymous, or the [employer] on Alcoholism and Drug Dependence. HOW TO STAY CLEAN WHEN YOU HAVE QUIT USING  Develop healthy activities.  Form friendships with those who do not use drugs.  Stay away from all drugs. Alcohol will lessen your ability to say no.  Have ready excuses available about why you cannot use. If that is difficult, stay away from people who knew you used. HOME CARE INSTRUCTIONS   Both prescription medicines and over-the-counter medicines are used as part of the treatment. It is critical to follow the recommendations of your caregiver at home. Any additional over-the-counter medications or changes in the recommended treatment plan should be discussed with your caregiver first.  It is important to keep fluids down. Juices, soda, Gatorade, or a mixture will help prevent dehydration.  Be prepared for the emotional swings of quitting.  Call your local emergency services if seizures (convulsions) occur or if you are unable to keep liquids down.  Keep a written record of medications you take and times given. Overcoming addictions takes years. Over time you will have a lessening of the craving for narcotics. Talk to your caregiver or a member of your support group if you need more help.  Addiction cannot be cured but it can be stopped. Treatment centers are listed in the yellow pages under: Cocaine, Narcotics, and Alcoholics Anonymous. Most hospitals and clinics can refer you to a specialized care center. Document Released: 09/13/2007 Document Revised: 02/08/2012 Document Reviewed: 09/13/2007 [facility] Patient Information 2014 Kevin [name].   Emergency Department Resource Guide 1) Find a Doctor and Pay Out of Pocket Although you won't have to find out who is covered  by your insurance plan, it is a good idea to ask around and get recommendations. You will then need to call the office and see if the doctor you have chosen will accept you as a new patient and what types of options they offer for patients who are self-pay. Some doctors offer discounts or will set up payment plans for their patients who do not have insurance, but you will need to ask so you aren't surprised when you get to your appointment.  2) Contact Your Local Health Department Not all health departments have doctors that can see patients for sick visits, but many do, so it is worth a call to see if yours does. If you don't know where your local health department is, you can check in your phone book. The CDC also has a tool to help you locate your state's health department, and many state websites also have listings of all of their local health departments.  3) Find a [facility] If your illness is not likely to be very severe or complicated, you may want to try a walk in clinic. These are popping up all over the country in pharmacies, drugstores, and shopping centers. They're usually staffed by nurse practitioners or physician assistants that have been trained to treat common illnesses and complaints. They're usually fairly quick and inexpensive. However, if you have serious medical issues or chronic medical problems, these are probably not your best option.  No Primary Care Doctor: -  Call Health Connect at  [phone] - they can help you locate a primary care doctor that  accepts your insurance, provides certain services, etc. - Physician Referral Service- [phone]  Chronic Pain Problems: Organization         Address  Phone   Notes  [facility]  [phone] Patients need to be referred by their primary care doctor.   Medication Assistance: Organization         Address  Phone   Notes  [facility] Medication [facility] [address] [place] [place], [place] [address] [phone] --Must be a resident of [facility] -- Must have NO insurance coverage whatsoever (no Medicaid/ Medicare, etc.) -- The pt. MUST have a primary care doctor that directs their care regularly and follows them in the community   MedAssist  [phone]   [employer]  [phone]    Agencies that provide inexpensive medical care: Organization         Address  Phone   Notes  [place]  [phone]   [facility]    [phone]   [facility] [place],  [address] [phone]   [place] [address] [place] [phone]   Planned Parenthood    [phone]   [facility]    [phone]   [place] and [place] [address], [place] Phone:  [phone], Fax:  [phone] Hours of Operation:  9 am - 6 pm, M-F.  Also accepts Medicaid/Medicare and self-pay.  [facility] for [place] [address], [address], [place] Phone: [phone], Fax: [phone]. Hours of Operation:  8:30 am - 5:30 pm, M-F.  Also accepts Medicaid and self-pay.  [facility] [place] [address], [place] Phone: [phone]   [place], [place], [place] [phone], Ext. [phone] Mondays & Thursdays: 7-9 AM.  First 15 patients are seen on a first come, first serve basis.    [place] Providers:  Organization         Address  Phone   Notes  [facility] [address], [place] [phone] Also accepts self-pay patients.  [facility] [id number] [place], [place]  [phone]   [place], [address], [place] [phone]   [facility] [address], [place] [phone]   Lucianne Lei [address], [place]   [phone] Only accepts [place] Access [place] patients after they have their name applied to their card.   Self-Pay (no insurance) in [facility]:  Organization         Address  Phone   Notes  Sickle Cell Patients, [facility] [place] [phone]   [facility]gent Care [place] [phone]   [facility] [place]  [place], [place], [place] [phone]   [facility]/Dr. Osei-Bonsu  [address], [place] or [address], [place] [phone] Phone number for both [place] and [place] locations is the same.  Urgent Medical and [facility] [address], [place] [phone]   [place]  [address], [place] or [address] [phone]   [facility] [place] [phone], phone; [phone], fax Sees patients 1st and 3rd Saturday of every month.  Must not qualify for public or private insurance (i.e. Medicaid, Medicare, [place] Health Choice, Veterans' Benefits)  Household income should be no more than 200% of the poverty level The clinic cannot treat you if you are pregnant or think you are pregnant  Sexually transmitted diseases are not treated at the clinic.    Dental Care: Organization         Address  Phone  Notes  [facility] Department of [facility] [phone] Accepts children up to age 42 who are enrolled in [place] or [place]; pregnant women with a Medicaid card; and children who have applied for Medicaid or [place] Health Choice, but were declined, whose parents can pay a reduced fee at time of service.  [facility] Department of [facility]  [address], [place] [phone] Accepts children up to age 20 who are enrolled  in [place] or [place]; pregnant women with a Medicaid card; and children who have applied for Medicaid or [place] Health Choice, but were declined, whose parents can pay a reduced fee at time of service.  [place] Adult [facility] Access PROGRAM  [place] [phone] Patients are seen by appointment only. Walk-ins are not accepted. [place] will see patients 56 years of age and older. Monday - Tuesday (8am-5pm) Most Wednesdays (8:30-5pm) $30 per visit, cash only  [facility] Adult Dental Access PROGRAM  [address], [facility] [phone] Patients are seen by appointment only. Walk-ins are not accepted. [place] will see patients 83 years of age and older. One Wednesday Evening (Monthly: Volunteer Based).  $30 per visit, cash only  [place]  [phone] for adults; Children under age 31, call Graduate Pediatric Dentistry at [phone]. Children aged 18-14, please call [phone] to request a pediatric application.  Dental services are provided in all areas of dental care including fillings, crowns and bridges, complete and partial dentures, implants, gum treatment, root canals, and extractions. Preventive care is also provided. Treatment is provided to both adults and children. Patients are selected via a lottery and there is often a waiting list.   [facility] [address] [place]  [phone] [email or web address]   [facility] [address] [place], [place] [phone], Ext. [phone] Second and Fourth Thursday of each month, opens at 6:30 AM; Clinic ends at 9 AM.  Patients are seen on a first-come first-served basis, and a limited number are seen during each clinic.   [facility]  [address] [place], [place] [phone]   Eligibility Requirements You must have lived in [place], [place], or [place] counties for at least the last three months.   You cannot be  eligible for state or federal sponsored [employer], including [employer], [place], or [employer].   You generally cannot be eligible for healthcare insurance through your employer.    How to apply: Eligibility screenings are held every Tuesday and Wednesday afternoon from 1:00 pm until 4:00 pm. You do not need an appointment for the interview!  [facility] [address] [place], [place]   [facility]  Department  [place] Department  [place]  [phone]    Behavioral Health Resources in the Community: Intensive Outpatient Programs Organization         Address  Phone  Notes  [place]. [address] [place], [place] [phone]   [facility]patient [address], [place], [place]   ADS: Alcohol & Drug Svcs [address], [place], [place]   [place] [address]  [place], [place] or [phone]   Substance Abuse Resources Organization         Address  Phone  Notes  Alcohol and Drug Services  [phone]   [place]  [phone]   The [place]   Chinita Pester  [phone]   Residential & Outpatient Substance Abuse Program  [phone]   Psychological Services Organization         Address  Phone  Notes  [facility] [place]  [place]  [phone]   [place] [address] [place] or [phone]    Mobile Crisis Teams Organization         Address  Phone  Notes  [facility], Mobile Crisis Care Unit  [phone]   [facility]  [address] [place], [place]   [place] [address], [place] [phone]    Self-Help/Support Groups Organization          Address  Phone             Notes  [place]. of [place] - variety of support groups  [place] Call for more information  Narcotics Anonymous (NA), [facility] [address], [employer] [place]  2 meetings at this location   Special educational needs teacher         Address  Phone  Notes  [facility] [place],    [place]  [phone]   [facility]  [address] [place] [address]63149, [place], [place]   [place], [place] [phone] Admissions: 8am-3pm M-F  Incentives Substance [place] [address],    [place], [place] [phone]   [facility] [address] [place], [place], [place]   [facility] [address],  [place], [place]   [facility] - Intensive Outpatient [address] Kristeen [address], [place], [place]   [facility] ([place] [place].,  [place], [place] [phone] or [phone]   [facility] Services ([facility]) [address] [place], [place] Accepts Medicaid  [facility] [place] [address],  [place] [phone] Substance Abuse/Addiction Treatment   [facility] Organization         Address  Phone  Notes  [facility]  [phone]   Domenic Schwab, PhD [address] [place]lis [place], [place]   [phone] or [phone]   [place], [place] [phone]   [place] [address], [place], [place] [phone] Insurance/Medicaid/sponsorship through [employer] and Families [address] [place] [address]  [place], [place] [phone] [place], [place] [phone]    Dr. Adele Schilder  [phone]   [facility] Dept. 1) [address] [place] 2) [place] 3)  [place] [address], [place] [phone]  [phone]   [place] [phone] or [phone] (After Hours)

## 2014-01-02 NOTE — ED Notes (Addendum)
Pt has $1440 in cash. Pt giving money to [employer] (cousin). Witnessed by pt, cousin and Babs Bertin, Therapist, sports.

## 2014-01-02 NOTE — ED Notes (Signed)
Percell Miller (cousin) phone- [phone].

## 2014-01-03 ENCOUNTER — Inpatient Hospital Stay (HOSPITAL_COMMUNITY)
Admission: AD | Admit: 2014-01-03 | Discharge: 2014-01-17 | DRG: 897 | Disposition: A | Discharge status: Home / Self Care | Payer: Medicare Other | Source: Intra-hospital | Attending: Psychiatry | Admitting: Psychiatry

## 2014-01-03 ENCOUNTER — Emergency Department (HOSPITAL_COMMUNITY)
Admission: EM | Admit: 2014-01-03 | Discharge: 2014-01-03 | Disposition: A | Discharge status: Other Acute Inpatient Hospital | Payer: Medicare Other | Source: Home / Self Care | Attending: Emergency Medicine | Admitting: Emergency Medicine

## 2014-01-03 ENCOUNTER — Encounter (HOSPITAL_COMMUNITY): Payer: Self-pay | Admitting: *Deleted

## 2014-01-03 ENCOUNTER — Encounter (HOSPITAL_COMMUNITY): Payer: Self-pay | Admitting: Emergency Medicine

## 2014-01-03 DIAGNOSIS — F1310 Sedative, hypnotic or anxiolytic abuse, uncomplicated: Secondary | ICD-10-CM | POA: Insufficient documentation

## 2014-01-03 DIAGNOSIS — F1994 Other psychoactive substance use, unspecified with psychoactive substance-induced mood disorder: Secondary | ICD-10-CM

## 2014-01-03 DIAGNOSIS — M109 Gout, unspecified: Secondary | ICD-10-CM | POA: Insufficient documentation

## 2014-01-03 DIAGNOSIS — F1910 Other psychoactive substance abuse, uncomplicated: Secondary | ICD-10-CM

## 2014-01-03 DIAGNOSIS — F1110 Opioid abuse, uncomplicated: Secondary | ICD-10-CM | POA: Insufficient documentation

## 2014-01-03 DIAGNOSIS — I10 Essential (primary) hypertension: Secondary | ICD-10-CM | POA: Insufficient documentation

## 2014-01-03 DIAGNOSIS — F411 Generalized anxiety disorder: Secondary | ICD-10-CM

## 2014-01-03 DIAGNOSIS — F1010 Alcohol abuse, uncomplicated: Secondary | ICD-10-CM

## 2014-01-03 DIAGNOSIS — Z79899 Other long term (current) drug therapy: Secondary | ICD-10-CM

## 2014-01-03 DIAGNOSIS — F10939 Alcohol use, unspecified with withdrawal, unspecified: Principal | ICD-10-CM

## 2014-01-03 DIAGNOSIS — F10239 Alcohol dependence with withdrawal, unspecified: Principal | ICD-10-CM

## 2014-01-03 DIAGNOSIS — E0590 Thyrotoxicosis, unspecified without thyrotoxic crisis or storm: Secondary | ICD-10-CM

## 2014-01-03 DIAGNOSIS — K219 Gastro-esophageal reflux disease without esophagitis: Secondary | ICD-10-CM

## 2014-01-03 DIAGNOSIS — E119 Type 2 diabetes mellitus without complications: Secondary | ICD-10-CM

## 2014-01-03 DIAGNOSIS — F1020 Alcohol dependence, uncomplicated: Secondary | ICD-10-CM

## 2014-01-03 LAB — CBC
HCT: 39.6 % (ref 39.0–52.0)
HEMOGLOBIN: 13.6 g/dL (ref 13.0–17.0)
MCH: 31.3 pg (ref 26.0–34.0)
MCHC: 34.3 g/dL (ref 30.0–36.0)
MCV: 91 fL (ref 78.0–100.0)
Platelets: 174 10*3/uL (ref 150–400)
RBC: 4.35 MIL/uL (ref 4.22–5.81)
RDW: 15 % (ref 11.5–15.5)
WBC: 6.2 10*3/uL (ref 4.0–10.5)

## 2014-01-03 LAB — COMPREHENSIVE METABOLIC PANEL
ALK PHOS: 55 U/L (ref 39–117)
ALT: 13 U/L (ref 0–53)
AST: 26 U/L (ref 0–37)
Albumin: 3.1 g/dL — ABNORMAL LOW (ref 3.5–5.2)
BUN: 24 mg/dL — ABNORMAL HIGH (ref 6–23)
CHLORIDE: 107 meq/L (ref 96–112)
CO2: 19 mEq/L (ref 19–32)
Calcium: 9.1 mg/dL (ref 8.4–10.5)
Creatinine, Ser: 1.42 mg/dL — ABNORMAL HIGH (ref 0.50–1.35)
GFR calc non Af Amer: 50 mL/min — ABNORMAL LOW (ref >90)
GFR, EST AFRICAN AMERICAN: 58 mL/min — AB (ref >90)
GLUCOSE: 156 mg/dL — AB (ref 70–99)
POTASSIUM: 4.7 meq/L (ref 3.7–5.3)
SODIUM: 144 meq/L (ref 137–147)
TOTAL PROTEIN: 7.1 g/dL (ref 6.0–8.3)
Total Bilirubin: 0.6 mg/dL (ref 0.3–1.2)

## 2014-01-03 LAB — RAPID URINE DRUG SCREEN, HOSP PERFORMED
Amphetamines: NOT DETECTED
BARBITURATES: NOT DETECTED
BENZODIAZEPINES: POSITIVE — AB
Cocaine: NOT DETECTED
Opiates: POSITIVE — AB
Tetrahydrocannabinol: NOT DETECTED

## 2014-01-03 LAB — ETHANOL: Alcohol, Ethyl (B): 59 mg/dL — ABNORMAL HIGH (ref 0–11)

## 2014-01-03 MED ORDER — CHLORDIAZEPOXIDE HCL 25 MG PO CAPS
25.0000 mg | ORAL_CAPSULE | Freq: Every day | ORAL | Status: AC
  Administered 2014-01-08: 25 mg via ORAL
  Filled 2014-01-03: qty 1

## 2014-01-03 MED ORDER — IRBESARTAN 300 MG PO TABS
300.0000 mg | ORAL_TABLET | Freq: Every day | ORAL | Status: DC
  Filled 2014-01-03: qty 1

## 2014-01-03 MED ORDER — CHLORDIAZEPOXIDE HCL 25 MG PO CAPS
25.0000 mg | ORAL_CAPSULE | ORAL | Status: AC
  Administered 2014-01-06 – 2014-01-07 (×2): 25 mg via ORAL
  Filled 2014-01-03 (×2): qty 1

## 2014-01-03 MED ORDER — CHLORDIAZEPOXIDE HCL 25 MG PO CAPS
25.0000 mg | ORAL_CAPSULE | Freq: Four times a day (QID) | ORAL | Status: AC | PRN
  Administered 2014-01-04 – 2014-01-05 (×2): 25 mg via ORAL
  Filled 2014-01-03 (×2): qty 1

## 2014-01-03 MED ORDER — OLMESARTAN-AMLODIPINE-HCTZ 40-10-12.5 MG PO TABS: 1.0000 | ORAL_TABLET | Freq: Every day | ORAL | Status: DC

## 2014-01-03 MED ORDER — CLONIDINE HCL 0.1 MG PO TABS
0.1000 mg | ORAL_TABLET | Freq: Four times a day (QID) | ORAL | Status: AC
  Administered 2014-01-04 – 2014-01-06 (×9): 0.1 mg via ORAL
  Filled 2014-01-03 (×12): qty 1

## 2014-01-03 MED ORDER — CHLORDIAZEPOXIDE HCL 25 MG PO CAPS
25.0000 mg | ORAL_CAPSULE | Freq: Four times a day (QID) | ORAL | Status: AC
  Administered 2014-01-04 – 2014-01-05 (×6): 25 mg via ORAL
  Filled 2014-01-03 (×6): qty 1

## 2014-01-03 MED ORDER — VITAMIN B-1 100 MG PO TABS
100.0000 mg | ORAL_TABLET | Freq: Every day | ORAL | Status: DC
  Administered 2014-01-03: 18:00:00 100 mg via ORAL
  Filled 2014-01-03: qty 1

## 2014-01-03 MED ORDER — LOPERAMIDE HCL 2 MG PO CAPS: 2.0000 mg | ORAL_CAPSULE | ORAL | Status: AC | PRN

## 2014-01-03 MED ORDER — ACETAMINOPHEN 325 MG PO TABS: 650.0000 mg | ORAL_TABLET | Freq: Four times a day (QID) | ORAL | Status: DC | PRN

## 2014-01-03 MED ORDER — LORAZEPAM 1 MG PO TABS: 1.0000 mg | ORAL_TABLET | Freq: Four times a day (QID) | ORAL | Status: DC | PRN

## 2014-01-03 MED ORDER — ONDANSETRON 4 MG PO TBDP: 4.0000 mg | ORAL_TABLET | Freq: Four times a day (QID) | ORAL | Status: AC | PRN

## 2014-01-03 MED ORDER — METHOCARBAMOL 500 MG PO TABS: 500.0000 mg | ORAL_TABLET | Freq: Three times a day (TID) | ORAL | Status: AC | PRN

## 2014-01-03 MED ORDER — THIAMINE HCL 100 MG/ML IJ SOLN: 100.0000 mg | Freq: Every day | INTRAMUSCULAR | Status: DC

## 2014-01-03 MED ORDER — CLONIDINE HCL 0.1 MG PO TABS
0.1000 mg | ORAL_TABLET | Freq: Every day | ORAL | Status: DC
  Filled 2014-01-03 (×2): qty 1

## 2014-01-03 MED ORDER — PANTOPRAZOLE SODIUM 20 MG PO TBEC
20.0000 mg | DELAYED_RELEASE_TABLET | Freq: Every day | ORAL | Status: DC
  Administered 2014-01-03: 20 mg via ORAL
  Filled 2014-01-03: qty 1

## 2014-01-03 MED ORDER — CLONIDINE HCL 0.1 MG PO TABS
0.1000 mg | ORAL_TABLET | ORAL | Status: AC
  Administered 2014-01-06 – 2014-01-08 (×4): 0.1 mg via ORAL
  Filled 2014-01-03 (×4): qty 1

## 2014-01-03 MED ORDER — CITALOPRAM HYDROBROMIDE 10 MG PO TABS
10.0000 mg | ORAL_TABLET | Freq: Every day | ORAL | Status: DC
  Administered 2014-01-04: 10 mg via ORAL
  Filled 2014-01-03 (×2): qty 1

## 2014-01-03 MED ORDER — ALUM & MAG HYDROXIDE-SIMETH 200-200-20 MG/5ML PO SUSP: 30.0000 mL | ORAL | Status: DC | PRN

## 2014-01-03 MED ORDER — PANTOPRAZOLE SODIUM 20 MG PO TBEC
20.0000 mg | DELAYED_RELEASE_TABLET | Freq: Every day | ORAL | Status: DC
  Administered 2014-01-04 – 2014-01-17 (×14): 20 mg via ORAL
  Filled 2014-01-03 (×16): qty 1

## 2014-01-03 MED ORDER — CLONIDINE HCL 0.1 MG PO TABS: 0.1000 mg | ORAL_TABLET | Freq: Four times a day (QID) | ORAL | Status: DC | PRN

## 2014-01-03 MED ORDER — DICYCLOMINE HCL 20 MG PO TABS: 20.0000 mg | ORAL_TABLET | Freq: Four times a day (QID) | ORAL | Status: AC | PRN

## 2014-01-03 MED ORDER — LORAZEPAM 2 MG/ML IJ SOLN: 1.0000 mg | Freq: Four times a day (QID) | INTRAMUSCULAR | Status: DC | PRN

## 2014-01-03 MED ORDER — LEVOTHYROXINE SODIUM 100 MCG PO TABS
100.0000 ug | ORAL_TABLET | Freq: Every day | ORAL | Status: DC
  Administered 2014-01-03: 100 ug via ORAL
  Filled 2014-01-03: qty 1

## 2014-01-03 MED ORDER — CITALOPRAM HYDROBROMIDE 10 MG PO TABS
10.0000 mg | ORAL_TABLET | Freq: Every day | ORAL | Status: DC
  Administered 2014-01-03: 10 mg via ORAL
  Filled 2014-01-03: qty 1

## 2014-01-03 MED ORDER — COLCHICINE 0.6 MG PO TABS
0.6000 mg | ORAL_TABLET | Freq: Every day | ORAL | Status: DC
  Administered 2014-01-04 – 2014-01-17 (×14): 0.6 mg via ORAL
  Filled 2014-01-03 (×17): qty 1

## 2014-01-03 MED ORDER — LORAZEPAM 1 MG PO TABS: 0.0000 mg | ORAL_TABLET | Freq: Two times a day (BID) | ORAL | Status: DC

## 2014-01-03 MED ORDER — METFORMIN HCL ER 500 MG PO TB24
1000.0000 mg | ORAL_TABLET | Freq: Every day | ORAL | Status: DC
  Administered 2014-01-04 – 2014-01-06 (×3): 1000 mg via ORAL
  Filled 2014-01-03 (×4): qty 2

## 2014-01-03 MED ORDER — VITAMIN B-1 100 MG PO TABS
100.0000 mg | ORAL_TABLET | Freq: Every day | ORAL | Status: DC
  Administered 2014-01-04 – 2014-01-17 (×14): 100 mg via ORAL
  Filled 2014-01-03 (×16): qty 1

## 2014-01-03 MED ORDER — COLCHICINE 0.6 MG PO TABS
0.6000 mg | ORAL_TABLET | Freq: Every day | ORAL | Status: DC
  Administered 2014-01-03: 0.6 mg via ORAL
  Filled 2014-01-03: qty 1

## 2014-01-03 MED ORDER — METFORMIN HCL ER 500 MG PO TB24
1000.0000 mg | ORAL_TABLET | Freq: Every day | ORAL | Status: DC
  Filled 2014-01-03: qty 2

## 2014-01-03 MED ORDER — HYDROCHLOROTHIAZIDE 12.5 MG PO CAPS
12.5000 mg | ORAL_CAPSULE | Freq: Every day | ORAL | Status: DC
  Administered 2014-01-03: 12.5 mg via ORAL
  Filled 2014-01-03: qty 1

## 2014-01-03 MED ORDER — ADULT MULTIVITAMIN W/MINERALS CH
1.0000 | ORAL_TABLET | Freq: Every day | ORAL | Status: DC
  Administered 2014-01-03: 1 via ORAL
  Filled 2014-01-03: qty 1

## 2014-01-03 MED ORDER — HYDROXYZINE HCL 25 MG PO TABS: 25.0000 mg | ORAL_TABLET | Freq: Three times a day (TID) | ORAL | Status: DC | PRN

## 2014-01-03 MED ORDER — ADULT MULTIVITAMIN W/MINERALS CH
1.0000 | ORAL_TABLET | Freq: Every day | ORAL | Status: DC
  Administered 2014-01-04 – 2014-01-17 (×14): 1 via ORAL
  Filled 2014-01-03 (×16): qty 1

## 2014-01-03 MED ORDER — IRBESARTAN 300 MG PO TABS
300.0000 mg | ORAL_TABLET | Freq: Every day | ORAL | Status: DC
  Administered 2014-01-03: 300 mg via ORAL
  Filled 2014-01-03 (×2): qty 1

## 2014-01-03 MED ORDER — GABAPENTIN 100 MG PO CAPS
100.0000 mg | ORAL_CAPSULE | Freq: Three times a day (TID) | ORAL | Status: DC
  Administered 2014-01-03: 100 mg via ORAL
  Filled 2014-01-03: qty 1

## 2014-01-03 MED ORDER — GABAPENTIN 100 MG PO CAPS
100.0000 mg | ORAL_CAPSULE | Freq: Three times a day (TID) | ORAL | Status: DC
  Administered 2014-01-04 – 2014-01-10 (×20): 100 mg via ORAL
  Filled 2014-01-03 (×26): qty 1

## 2014-01-03 MED ORDER — FOLIC ACID 1 MG PO TABS
1.0000 mg | ORAL_TABLET | Freq: Every day | ORAL | Status: DC
  Administered 2014-01-03: 1 mg via ORAL
  Filled 2014-01-03: qty 1

## 2014-01-03 MED ORDER — THIAMINE HCL 100 MG/ML IJ SOLN: 100.0000 mg | Freq: Once | INTRAMUSCULAR | Status: DC

## 2014-01-03 MED ORDER — LEVOTHYROXINE SODIUM 100 MCG PO TABS
100.0000 ug | ORAL_TABLET | Freq: Every day | ORAL | Status: DC
  Administered 2014-01-04 – 2014-01-17 (×14): 100 ug via ORAL
  Filled 2014-01-03 (×17): qty 1

## 2014-01-03 MED ORDER — LORAZEPAM 1 MG PO TABS: 0.0000 mg | ORAL_TABLET | Freq: Four times a day (QID) | ORAL | Status: DC

## 2014-01-03 MED ORDER — AMLODIPINE BESYLATE 10 MG PO TABS
10.0000 mg | ORAL_TABLET | Freq: Every day | ORAL | Status: DC
  Administered 2014-01-03: 10 mg via ORAL
  Filled 2014-01-03: qty 1

## 2014-01-03 MED ORDER — CHLORDIAZEPOXIDE HCL 25 MG PO CAPS
25.0000 mg | ORAL_CAPSULE | Freq: Three times a day (TID) | ORAL | Status: AC
  Administered 2014-01-05 – 2014-01-06 (×3): 25 mg via ORAL
  Filled 2014-01-03 (×3): qty 1

## 2014-01-03 MED ORDER — TRAZODONE HCL 50 MG PO TABS
50.0000 mg | ORAL_TABLET | Freq: Every evening | ORAL | Status: DC | PRN
  Administered 2014-01-04 – 2014-01-07 (×5): 50 mg via ORAL
  Filled 2014-01-03 (×26): qty 1

## 2014-01-03 MED ORDER — MAGNESIUM HYDROXIDE 400 MG/5ML PO SUSP: 30.0000 mL | Freq: Every day | ORAL | Status: DC | PRN

## 2014-01-03 MED ORDER — HYDROXYZINE HCL 25 MG PO TABS
25.0000 mg | ORAL_TABLET | Freq: Four times a day (QID) | ORAL | Status: AC | PRN
  Administered 2014-01-06: 25 mg via ORAL
  Filled 2014-01-03 (×2): qty 1

## 2014-01-03 NOTE — ED Provider Notes (Signed)
CSN: [id number]     Arrival date & time 01/03/14  1643 History   First MD Initiated Contact with Patient 01/03/14 1850     Chief Complaint  Patient presents with  . Medical Clearance    HPI Patient presents to emergency room with complaints of persistent trouble with alcohol and drug addiction. Patient states he has been drinking alcohol regularly. He also has been abusing Percocet and Xanax that he is buying off the street. Patient states he has been trying to self treat chronic anxiety problems. Patient denies any suicidal or homicidal ideation. He was seen in the emergency department yesterday and was given outpatient resources to patient call behavioral health stool but still having trouble he should come to the emergency department. Past Medical History  Diagnosis Date  . Arthritis   . Gout   . Alcohol abuse   . Hypertension   . Hyperthyroidism   . Diabetes mellitus without complication   . GERD (gastroesophageal reflux disease)    History reviewed. No pertinent past surgical history. History reviewed. No pertinent family history. History  Substance Use Topics  . Smoking status: Never Smoker   . Smokeless tobacco: Never Used  . Alcohol Use: Yes     Comment: one fifth daily    Review of Systems  All other systems reviewed and are negative.    Allergies  Review of patient's allergies indicates no known allergies.  Home Medications   Current Outpatient Rx  Name  Route  Sig  Dispense  Refill  . citalopram (CELEXA) 10 MG tablet   Oral   Take 1 tablet (10 mg total) by mouth daily. For depression   30 tablet   0   . cloNIDine (CATAPRES) 0.1 MG tablet   Oral   Take 1 tablet (0.1 mg total) by mouth every 6 (six) hours as needed (withdrawl symptoms).   12 tablet   0   . colchicine 0.6 MG tablet   Oral   Take 1 tablet (0.6 mg total) by mouth daily. For gout arthritis         . gabapentin (NEURONTIN) 100 MG capsule   Oral   Take 1 capsule (100 mg total) by mouth 3  (three) times daily. For anxiety/anxiety   90 capsule   0   . hydrOXYzine (ATARAX/VISTARIL) 25 MG tablet   Oral   Take 1 tablet (25 mg total) by mouth 3 (three) times daily as needed for anxiety.   90 tablet   0   . lansoprazole (PREVACID) 30 MG capsule   Oral   Take 1 capsule (30 mg total) by mouth daily. For acid reflux         . levothyroxine (SYNTHROID, LEVOTHROID) 100 MCG tablet   Oral   Take 1 tablet (100 mcg total) by mouth daily. For thyroid hormone replacement         . metFORMIN (GLUMETZA) 1000 MG (MOD) 24 hr tablet   Oral   Take 1 tablet (1,000 mg total) by mouth daily with breakfast. For diabetes         . Olmesartan-Amlodipine-HCTZ (TRIBENZOR) 40-10-12.5 MG TABS   Oral   Take 1 tablet by mouth daily. For hypertension   30 tablet      . zolpidem (AMBIEN) 5 MG tablet   Oral   Take 1 tablet (5 mg total) by mouth at bedtime as needed for sleep.   7 tablet   0    BP 112/69  Pulse 77  Temp(Src) 98  F (36.7 C) (Oral)  Resp 20  Wt 234 lb (106.142 kg)  SpO2 97% Physical Exam  Nursing note and vitals reviewed. Constitutional: He appears well-developed and well-nourished. No distress.  HENT:  Head: Normocephalic and atraumatic.  Right Ear: External ear normal.  Left Ear: External ear normal.  Eyes: Conjunctivae are normal. Right eye exhibits no discharge. Left eye exhibits no discharge. No scleral icterus.  Neck: Neck supple. No tracheal deviation present.  Cardiovascular: Normal rate.   Pulmonary/Chest: Effort normal. No stridor. No respiratory distress.  Musculoskeletal: He exhibits no edema.  Neurological: He is alert. Cranial nerve deficit: no gross deficits.  Skin: Skin is warm and dry. No rash noted.  Psychiatric: He has a normal mood and affect. His affect is not labile. His speech is not slurred. He is not agitated and not withdrawn. He does not exhibit a depressed mood. He expresses no homicidal and no suicidal ideation. He expresses no  suicidal plans and no homicidal plans.    ED Course  Procedures (including critical care time) Labs Review Labs Reviewed  COMPREHENSIVE METABOLIC PANEL - Abnormal; Notable for the following:    Glucose, Bld 156 (*)    BUN 24 (*)    Creatinine, Ser 1.42 (*)    Albumin 3.1 (*)    GFR calc non Af Amer 50 (*)    GFR calc Af Amer 58 (*)    All other components within normal limits  ETHANOL - Abnormal; Notable for the following:    Alcohol, Ethyl (B) 59 (*)    All other components within normal limits  URINE RAPID DRUG SCREEN (HOSP PERFORMED) - Abnormal; Notable for the following:    Opiates POSITIVE (*)    Benzodiazepines POSITIVE (*)    All other components within normal limits  CBC   Imaging Review No results found.   MDM    Patient is medically stable. We will consult psychiatry team to see whether the patient is a candidate for any inpatient treatment   Kathalene Frames, MD 01/03/14 2347

## 2014-01-03 NOTE — ED Notes (Signed)
PATIENT REPORTS THAT HE TAKES ABOUT 1.5 MG XANAX DAILY, 7-8 HYDROCODONE TABLETS AND A FIFTH OF LIQUOR DAILY. STATES HE IS TIRED OF IT AND WANTS TO FEEL BETTER. STATES HE HAD HIS LAST ETOH BEFORE ARRIVAL TONIGHT. STATES HE HAS DRANK ABOUT A FIFTH TODAY. STATES HE IS NERVOUS AND HAS BEEN NERVOUS ALL HIS LIFE. STATES HE WANTS TO GO TO BH WITH DR LUGO AND HAVE THEM "WAIT ON ME AND FEED ME AND TAKE CARE OF ME SO I CAN FEEL BETTER". HE DENIES SI OR HI. HE IS COOPERATIVE. HIS FAMILY HAS GONE HOME AND TAKEN A BAG OF CLOTHING AND A UNDETERMINED AMOUNT OF CASH WITH THEM. PT GAVE IT TO HIM TO TAKE HOME.

## 2014-01-03 NOTE — ED Notes (Signed)
Kevin [name] from behavior healt had patient sign transfer paper and papers were then fax to behavior health under the direction of the counsleor Effie to have pt transport to [place] and arrived approx 2230

## 2014-01-03 NOTE — ED Notes (Signed)
Pt in requesting detox from ETOH, xanax and percocet, states his last use was a few hours ago, seen for same last night

## 2014-01-03 NOTE — BH Assessment (Signed)
Tele Assessment Note   Kevin [name] is an 68 y.o. male.   What Led to ER  Pt reports coming to ER last night and being d/c by ER.  Pt initially OK with plan and thought his issues could be handled on an optx basis.  However, today pt continued to experience anxiety and depression and "It just became overwhelming.  I don't want to live like this.  I just couldn't take it anymore.  I need to go somewhere and get some help.  I can't do it on my own."  TTS spoke with Dr. Dorie Rank and he reported he wanted pt seen despite being seen last night by MD because pt did not get the help he was seeking and MD wanted him evaluated.   Suicidality:  Pt denies any intent or interest or plan or thoughts to hurt self.  "I never have.  I want to live."    Homicidality:  Pt denies any hx of hurting others or having current plans, thoughts or intent to do so.  "I'm not that way.  I don't want anyone hurt."  Psychosis:  Pt reports hearing voices and thinking "That's the radio but you know, no one was there.  It was strange."  Pt reports this happened one time about a month ago.  Not since and denies hearing voices and does not appear to be experiencing any delusional behaviors.  SA Hx:  Pt main issue for treatment is depression and alcohol and opiate use.  Pt reports consuming 5th per day and using anywhere from 1 to 7 pain pills 3 to 4x per week - sometimes more.  Pt BAL is 59 and pt is positive for opiates.    MSE byTTS:  Pt made fair eye contact, Ox3, speech slurred and soft, pt cooperative and very polite, judgement appeared intact, sleep same (around 5 hours per night), pt appeared nourished.    Pt reports he can perform all his ADLS.  Nurse to get pt up and observe him walk to assess gait per request from TTS by [facility] at [facility].  TTS spoke directly with the nurse at 1927 01-03-14.  Nurse to call [facility] back when assessed with report.  Recommendations:  TTS reviewed with [facility] at [facility] and PA and pt was  accepted to 300-1 hall bed at [facility] by [place] [facility].  Pt bed assignement is and pt can be admitted to [facility] by 2230.  What ER Nurse will need to do:  Nurse to call report [phone] at [facility] prior to sending pt Nurse to call [facility] to transport pt Nurse to have pt complete Voluntary paper work and fax back to [place] and then send originals with [facility] to [facility]    Axis I: Alcohol Abuse, Generalized Anxiety Disorder, Major Depression, Recurrent severe and Substance Abuse Axis II: Deferred Axis III:  Past Medical History  Diagnosis Date  . Arthritis   . Gout   . Alcohol abuse   . Hypertension   . Hyperthyroidism   . Diabetes mellitus without complication   . GERD (gastroesophageal reflux disease)    Axis IV: other psychosocial or environmental problems and problems related to social environment Axis V: 41-50 serious symptoms  Past Medical History:  Past Medical History  Diagnosis Date  . Arthritis   . Gout   . Alcohol abuse   . Hypertension   . Hyperthyroidism   . Diabetes mellitus without complication   . GERD (gastroesophageal reflux disease)  History reviewed. No pertinent past surgical history.  Family History: History reviewed. No pertinent family history.  Social History:  reports that he has never smoked. He has never used smokeless tobacco. He reports that he drinks alcohol. He reports that he does not use illicit drugs.  Additional Social History:  Alcohol / Drug Use Pain Medications: 1-7 perocets per day, maybe more; mg vary Prescriptions: no Over the Counter: off street History of alcohol / drug use?: Yes Longest period of sobriety (when/how long): doesn't know Negative Consequences of Use: Personal relationships Substance #1 Name of Substance 1: alochol 1 - Age of First Use: teen 1 - Amount (size/oz): 5th 1 - Frequency: daily 1 - Duration: months 1 - Last Use / Amount: 01-02-14 Substance #2 Name of Substance 2: Opiates (pain pills) 2 - Age of  First Use: 30s 2 - Amount (size/oz): varies - 1 - 7 or 10 pills 2 - Frequency: 3 to 4x per week; somtimes daily 2 - Duration: months 2 - Last Use / Amount: 01-02-14  CIWA: CIWA-Ar BP: 112/69 mmHg Pulse Rate: 77 Nausea and Vomiting: no nausea and no vomiting Tactile Disturbances: none Tremor: no tremor Auditory Disturbances: not present Paroxysmal Sweats: no sweat visible Visual Disturbances: not present Anxiety: two Headache, Fullness in Head: very mild Agitation: normal activity Orientation and Clouding of Sensorium: oriented and can do serial additions CIWA-Ar Total: 3 COWS:    Allergies: No Known Allergies  Home Medications:  (Not in a hospital admission)  OB/GYN Status:  No LMP for male patient.  General Assessment Data Location of Assessment: [facility] ED Is this a Tele or Face-to-Face Assessment?: Tele Assessment Is this an Initial Assessment or a Re-assessment for this encounter?: Initial Assessment Living Arrangements: Alone Can pt return to current living arrangement?: Yes Admission Status: Voluntary Is patient capable of signing voluntary admission?: Yes Transfer from: [facility] Referral Source: MD  Medical Screening Exam ([place]) Medical Exam completed: Yes  [place] Living Arrangements: Alone  Education Status Is patient currently in school?: No Current Grade: na Highest grade of school patient has completed: na Name of school: na Contact person: na  Risk to self Suicidal Ideation: No Suicidal Intent: No Is patient at risk for suicide?: No Suicidal Plan?: No Access to Means: No What has been your use of drugs/alcohol within the last 12 months?: alcohol and pain pills Previous Attempts/Gestures: No How many times?: 0 Other Self Harm Risks: na Triggers for Past Attempts: None known Intentional Self Injurious Behavior: None Family Suicide History: No Recent stressful life event(s): Other (Comment) (na) Persecutory  voices/beliefs?: No Depression: Yes Depression Symptoms: Tearfulness;Isolating;Fatigue;Guilt;Loss of interest in usual pleasures;Feeling worthless/self pity;Feeling angry/irritable Substance abuse history and/or treatment for substance abuse?: Yes Suicide prevention information given to non-admitted patients: Not applicable  Risk to Others Homicidal Ideation: No Thoughts of Harm to Others: No Current Homicidal Intent: No Current Homicidal Plan: No Access to Homicidal Means: No Identified Victim: na History of harm to others?: No Assessment of Violence: None Noted Violent Behavior Description: cooperative Does patient have access to weapons?: No Criminal Charges Pending?: No Does patient have a court date: No  Psychosis Hallucinations: None noted Delusions: None noted  Mental Status Report Appear/Hygiene: Disheveled Eye Contact: Fair Motor Activity: Unremarkable Speech: Logical/coherent;Soft Level of Consciousness: Alert Mood: Depressed;Anxious;Sad;Worthless, low self-esteem Affect: Anxious;Depressed;Appropriate to circumstance;Sad Anxiety Level: Moderate Thought Processes: Coherent Judgement: Unimpaired Orientation: Person;Place;Situation;Appropriate for developmental age Obsessive Compulsive Thoughts/Behaviors: None  Cognitive Functioning Concentration: Decreased Memory: Recent  Intact;Remote Intact IQ: Average Insight: Fair Impulse Control: Fair Appetite: Fair Weight Loss: 0 Weight Gain: 0 Sleep: No Change Total Hours of Sleep: 5 Vegetative Symptoms: None  ADLScreening [facility]) Patient's cognitive ability adequate to safely complete daily activities?: Yes Patient able to express need for assistance with ADLs?: Yes Independently performs ADLs?: Yes (appropriate for developmental age) (when using pt reports being unsteady)  Prior Inpatient Therapy Prior Inpatient Therapy: Yes Prior Therapy Dates: 10-14, 2013 Prior Therapy Facilty/Provider(s):  [facility] Reason for Treatment: detox  Prior Outpatient Therapy Prior Outpatient Therapy: Yes Prior Therapy Dates: 2014 Prior Therapy Facilty/Provider(s): PCP Reason for Treatment: med mgt and depression  ADL Screening (condition at time of admission) Patient's cognitive ability adequate to safely complete daily activities?: Yes Patient able to express need for assistance with ADLs?: Yes Independently performs ADLs?: Yes (appropriate for developmental age) (when using pt reports being unsteady)                  Additional Information 1:1 In Past 12 Months?: No CIRT Risk: No Elopement Risk: No Does patient have medical clearance?: Yes     Disposition:  Disposition Initial Assessment Completed for this Encounter: Yes Disposition of Patient: Inpatient treatment program Type of inpatient treatment program: Adult  John Giovanni 01/03/2014 7:53 PM

## 2014-01-03 NOTE — ED Notes (Signed)
DR KNAPP IN TO EVAL PT

## 2014-01-03 NOTE — Tx Team (Signed)
Initial Interdisciplinary Treatment Plan  PATIENT STRENGTHS: (choose at least two) Ability for insight Active sense of humor Average or above average intelligence Capable of independent living Communication skills General fund of knowledge Motivation for treatment/growth Physical Health Religious Affiliation Special hobby/interest Work skills  PATIENT STRESSORS: Medication change or noncompliance Substance abuse   PROBLEM LIST: Problem List/Patient Goals Date to be addressed Date deferred Reason deferred Estimated date of resolution  "Help me from drinking and keep my nerves from going off" 01/04/14     " I want to stop being depressed and get something for anxiety" 01/04/14           depression 01/04/14     Increased risk for suicide 01/04/14     Substance abuse 01/04/14     Gout, NIDDM, HypoTSH, GERD, Arthritis,  01/04/14                  DISCHARGE CRITERIA:  Ability to meet basic life and health needs Adequate post-discharge living arrangements Improved stabilization in mood, thinking, and/or behavior Medical problems require only outpatient monitoring Motivation to continue treatment in a less acute level of care Need for constant or close observation no longer present Reduction of life-threatening or endangering symptoms to within safe limits Safe-care adequate arrangements made Verbal commitment to aftercare and medication compliance Withdrawal symptoms are absent or subacute and managed without 24-hour nursing intervention  PRELIMINARY DISCHARGE PLAN: Attend 12-step recovery group Outpatient therapy Participate in family therapy Return to previous living arrangement Return to previous work or school arrangements  PATIENT/FAMIILY INVOLVEMENT: This treatment plan has been presented to and reviewed with the patient, Kevin [name], and/or family member.  The patient and family have been given the opportunity to ask questions and make suggestions.  Wynonia Hazard  [place] 01/03/2014, 11:50 PM

## 2014-01-03 NOTE — ED Notes (Signed)
Gave report to [facility]  In patient [facility] and notify [facility] of the need of transportation patient to [facility].

## 2014-01-03 NOTE — ED Notes (Signed)
Pt has eaten 75% of his meal.

## 2014-01-04 ENCOUNTER — Encounter (HOSPITAL_COMMUNITY): Payer: Self-pay | Admitting: Psychiatry

## 2014-01-04 DIAGNOSIS — F10988 Alcohol use, unspecified with other alcohol-induced disorder: Secondary | ICD-10-CM

## 2014-01-04 LAB — GLUCOSE, CAPILLARY
Glucose-Capillary: 127 mg/dL — ABNORMAL HIGH (ref 70–99)
Glucose-Capillary: 150 mg/dL — ABNORMAL HIGH (ref 70–99)
Glucose-Capillary: 139 mg/dL — ABNORMAL HIGH (ref 70–99)
Glucose-Capillary: 122 mg/dL — ABNORMAL HIGH (ref 70–99)
Glucose-Capillary: 112 mg/dL — ABNORMAL HIGH (ref 70–99)

## 2014-01-04 MED ORDER — AMLODIPINE BESYLATE 10 MG PO TABS
10.0000 mg | ORAL_TABLET | Freq: Every day | ORAL | Status: DC
  Administered 2014-01-04 – 2014-01-08 (×5): 10 mg via ORAL
  Filled 2014-01-04 (×7): qty 1

## 2014-01-04 MED ORDER — HYDROCHLOROTHIAZIDE 12.5 MG PO CAPS
12.5000 mg | ORAL_CAPSULE | Freq: Every day | ORAL | Status: DC
  Administered 2014-01-04 – 2014-01-17 (×14): 12.5 mg via ORAL
  Filled 2014-01-04 (×16): qty 1

## 2014-01-04 MED ORDER — INSULIN ASPART 100 UNIT/ML ~~LOC~~ SOLN
4.0000 [IU] | Freq: Three times a day (TID) | SUBCUTANEOUS | Status: DC
  Administered 2014-01-04 – 2014-01-06 (×7): 4 [IU] via SUBCUTANEOUS

## 2014-01-04 MED ORDER — CITALOPRAM HYDROBROMIDE 20 MG PO TABS
20.0000 mg | ORAL_TABLET | Freq: Every day | ORAL | Status: DC
  Administered 2014-01-05 – 2014-01-17 (×13): 20 mg via ORAL
  Filled 2014-01-04 (×5): qty 1
  Filled 2014-01-04: qty 4
  Filled 2014-01-04 (×9): qty 1

## 2014-01-04 MED ORDER — INSULIN ASPART 100 UNIT/ML ~~LOC~~ SOLN
0.0000 [IU] | Freq: Three times a day (TID) | SUBCUTANEOUS | Status: DC
  Administered 2014-01-04 – 2014-01-06 (×7): 2 [IU] via SUBCUTANEOUS

## 2014-01-04 MED ORDER — IRBESARTAN 300 MG PO TABS
300.0000 mg | ORAL_TABLET | Freq: Every day | ORAL | Status: DC
  Administered 2014-01-04 – 2014-01-12 (×9): 300 mg via ORAL
  Filled 2014-01-04 (×11): qty 1

## 2014-01-04 MED ORDER — CLONIDINE HCL 0.1 MG PO TABS: 0.1000 mg | ORAL_TABLET | Freq: Three times a day (TID) | ORAL | Status: DC | PRN

## 2014-01-04 MED ORDER — CITALOPRAM HYDROBROMIDE 10 MG PO TABS
10.0000 mg | ORAL_TABLET | Freq: Once | ORAL | Status: AC
  Administered 2014-01-04: 10 mg via ORAL
  Filled 2014-01-04: qty 1

## 2014-01-04 NOTE — Progress Notes (Signed)
Patient ID: Kevin [name], male   DOB: 1946/09/24, 68 y.o.   MRN: [id number] PER STATE REGULATIONS 482.30  THIS CHART WAS REVIEWED FOR MEDICAL NECESSITY WITH RESPECT TO THE PATIENT'S ADMISSION/ DURATION OF STAY.  NEXT REVIEW DATE: 01/07/2014  Chauncy Lean, RN, BSN CASE MANAGER

## 2014-01-04 NOTE — Progress Notes (Signed)
D Pt. Denies SI and HI,  Pt. Has no complaints of pain at present, has been sleeping this pm.  A Writer offered support and encouragement.     R Pt. Remains safe on the unit. Writer has had to wake the pt. To assess him and to send him to dinner.  Pt.'s BP was low so withheld the clonidin  And will recheck after dinner.

## 2014-01-04 NOTE — BHH Suicide Risk Assessment (Signed)
Suicide Risk Assessment  Admission Assessment     Nursing information obtained from:  Patient Demographic factors:  Male;Age 66 or older;Living alone Current Mental Status:  NA Loss Factors:  Loss of significant relationship Historical Factors:  Impulsivity Risk Reduction Factors:  Sense of responsibility to family;Religious beliefs about death;Employed;Positive social support Total Time spent with patient: 45 minutes  CLINICAL FACTORS:   Depression:   Comorbid alcohol abuse/dependence Alcohol/Substance Abuse/Dependencies  Psychiatric Specialty Exam:     Blood pressure 102/68, pulse 61, temperature 98.1 F (36.7 C), temperature source Oral, resp. rate 17, height 5' 9.5" (1.765 m), weight 103.42 kg (228 lb).Body mass index is 33.2 kg/(m^2).  General Appearance: Fairly Groomed  Engineer, water::  Fair  Speech:  Clear and Coherent, Slow and not spontaneous  Volume:  Decreased  Mood:  Anxious and Depressed  Affect:  Restricted  Thought Process:  Coherent and Goal Directed  Orientation:  Full (Time, Place, and Person)  Thought Content:  symptoms, worries, concerns, no spontaneous content answers what he is asked for  Suicidal Thoughts:  No  Homicidal Thoughts:  No  Memory:  Immediate;   Fair Recent;   Fair Remote;   Fair  Judgement:  Fair  Insight:  Shallow  Psychomotor Activity:  Restlessness  Concentration:  Fair  Recall:  [employer] of Knowledge:Fair  Language: Fair  Akathisia:  No  Handed:    AIMS (if indicated):     Assets:  Desire for Improvement  Sleep:  Number of Hours: 4.25   Musculoskeletal: Strength & Muscle Tone: within normal limits Gait & Station: normal Patient leans: N/A  COGNITIVE FEATURES THAT CONTRIBUTE TO RISK:  Closed-mindedness Polarized thinking Thought constriction (tunnel vision)    SUICIDE RISK:   Moderate:  Frequent suicidal ideation with limited intensity, and duration, some specificity in terms of plans, no associated intent, good  self-control, limited dysphoria/symptomatology, some risk factors present, and identifiable protective factors, including available and accessible social support.  PLAN OF CARE: Supportive approach/copign skills/relapse prevention                               Detox with Librium                               Reassess and address the co morbidities  I certify that inpatient services furnished can reasonably be expected to improve the patient's condition.  Authur Cubit A 01/04/2014, 5:58 PM

## 2014-01-04 NOTE — BHH Counselor (Addendum)
Adult Psychosocial Assessment Update Interdisciplinary Team  Previous [facility] admissions/discharges:  Admissions Discharges  Date: 01/03/14 Date: unknown at this time.   Date: 01/24/13 Date: 01/30/13  Date: 05/20/11 Date: 05/25/11  Date: 04/19/11 Date: 04/22/11  Date: Date:   Changes since the last Psychosocial Assessment (including adherence to outpatient mental health and/or substance abuse treatment, situational issues contributing to decompensation and/or relapse). Pt was pleasant and cooperative during the adm process. Informed the writer that he initially lied during the assessment interview. Stated hasn't been sober for 1 yr as he said, but that he was drinking "off and on". Pt admits to drinking daily and taking pills whenever he has a chance. Pt stated he doesn't believe the Dr gave him a "high enough dose" of his antidepressant. Stated, "I didn't want it to be too strong, but he gave me a weak dose", referring to the 10 mg celexa. Pt has a hx of gout, polysub, htn, niddm, gerd, hypotsh, cad, and arthritis.Pt lives alone and states he doesn't have many support systems. Pt denies SI, HI, and A/V.             Discharge Plan 1. Will you be returning to the same living situation after discharge?   Yes: No:      If no, what is your plan?    Pt unsure about what he wants to do at this time. CSW and pt exploring options. CSW assessing.        2. Would you like a referral for services when you are discharged? Yes:     If yes, for what services?  No:       Pt unsure about what to do at this time. Pt in bed, reluctant to discuss after care options/plans currently. CSW assessing and will meet with pt later in the day to discuss.        Summary and Recommendations (to be completed by the evaluator) Pt is 68 year old [place] [facility] [place] [place] [facility] He presents to [facility] for ETOH detox and mood stabilization. Recommendations for pt include: crisis stabilization,  therapeutic milieu, encourage group attendance and participation, librium taper for withdrawals, medication management for mood stabilization, and development of comprehensive mental wellness/sobriety plan. Pt currently unsure about what he wants in terms of aftercare and is exploring options with CSW (i/p vs o/p options).                        Signature:  Smart, Alicia Amel  01/04/2014 10:31 AM

## 2014-01-04 NOTE — Progress Notes (Signed)
Psychoeducational Group Note  Date:  01/04/2014 Time: 2100 Group Topic/Focus:  wrap up group  Participation Level: Did Not Attend  Participation Quality:  Not Applicable  Affect:  Not Applicable  Cognitive:  Not Applicable  Insight:  Not Applicable  Engagement in Group: Not Applicable  Additional Comments:  Pt remained in bed during group.  Jacques Navy 01/04/2014, 10:13 PM

## 2014-01-04 NOTE — Progress Notes (Signed)
Patient ID: Kevin [name], male   DOB: [birth year], 68 y.o.   MRN: [id number]   D: Pt was pleasant and cooperative during the adm process. Informed the writer that he initially lied during the assessment interview. Stated hasn't been sober for 1 yr as he said, but that he was drinking "off and on".  Writer was attempting to confirm report given, with pt being sober a year and relapsing 6 to 7 months ago. Pt stated, "off and on, I ain't gonna tell that", referring to continuing with a lie. Writer encouraged pt to be truthful with staff in order to get the full benefit of his experience at [facility]. Pt admits to drinking daily and taking pills whenever he has a chance. Pt stated he doesn't believe the Dr gave him a "high enough dose" of his antidepressant. Stated, "I didn't want it to be too strong, but he gave me a weak dose", referring to the 10 mg celexa. Pt has a hx of gout, polysub, htn, niddm, gerd, hypotsh, cad, and arthritis.Pt lives alone and states he doesn't have many support systems. Pt denies SI, HI, and A/V.

## 2014-01-04 NOTE — H&P (Signed)
Psychiatric Admission Assessment Adult  Patient Identification:  Kevin [name]  Date of Evaluation:  01/04/2014  Chief Complaint:  ALCOHOL DEPENDENCY  History of Present Illness: This is a 68 year old African-American male. Admitted to [facility] from the [facility] ED with a request for opioid detox. Patient reports, "A friend carried me over to the [facility] ED yesterday because I was having anxiety attacks.I don't know what id causing it. I know that I have depressed all my life. I never participated in weddings and or any other occasions because such activities make my anxiety go bad. My depression gets the best of me. I buy pills of the streets to numb and calm me". Kevin [name] say he would try something for depression. Reports being sober for 27 years.  Elements:  Location:  Alcohol dependence, Benzodiazepine dependence, Opioid dependence. Quality:  Highly depressed, self isolation, high anxiety level, panic symptoms. Severity:  Severe, "My depression/anxiety are so bad, I don't participate in evenst like weddings and or other social gatherings.. Timing:  "I have been depressed all my lfe". Duration:  Chronic. Context:  "I have high anxiety and depression, I take pills to numb them".  Associated Signs/Synptoms:  Depression Symptoms:  depressed mood, hopelessness, panic attacks, insomnia, loss of energy/fatigue, decreased appetite,  (Hypo) Manic Symptoms:  Impulsivity,  Anxiety Symptoms:  Excessive Worry, Panic Symptoms,  Psychotic Symptoms:  Hallucinations: None  PTSD Symptoms: Had a traumatic exposure:  None reported  Total Time spent with patient: 1 hour  Psychiatric Specialty Exam: Physical Exam  Constitutional: He is oriented to person, place, and time. He appears well-developed.  HENT:  Head: Normocephalic.  Eyes: Pupils are equal, round, and reactive to light.  Neck: Normal range of motion.  Cardiovascular: Normal rate.   Respiratory: Effort  normal.  GI: Soft.  Genitourinary:  Denies any problems in this area  Musculoskeletal: He exhibits edema (Hands) and tenderness (Joints).  Neurological: He is alert and oriented to person, place, and time.  Skin: Skin is warm and dry.  Psychiatric: His speech is normal and behavior is normal. Thought content normal. His mood appears anxious (Rated #8). His affect is not angry, not blunt, not labile and not inappropriate. Cognition and memory are normal. He expresses impulsivity. He exhibits a depressed mood (Rated #8).    Review of Systems  Constitutional: Negative.   HENT: Negative.   Eyes: Negative.   Respiratory: Negative.   Cardiovascular: Negative.   Gastrointestinal: Negative.   Genitourinary: Negative.   Musculoskeletal: Positive for joint pain.  Skin: Negative for itching and rash.       Some swellings present to hand areas  Neurological: Negative.   Endo/Heme/Allergies: Negative.   Psychiatric/Behavioral: Positive for depression (Rated #8) and substance abuse (Alcoholis, opiate, Benzodiazepine abuse). Negative for suicidal ideas, hallucinations and memory loss. The patient is nervous/anxious and has insomnia.     Blood pressure 123/76, pulse 76, temperature 97.4 F (36.3 C), temperature source Oral, resp. rate 20, height 5' 9.5" (1.765 m), weight 103.42 kg (228 lb).Body mass index is 33.2 kg/(m^2).  General Appearance: Disheveled  Eye Sport and exercise psychologist::  Fair  Speech:  Clear and Coherent  Volume:  Normal  Mood:  Anxious, Depressed and rated both at #8  Affect:  Flat  Thought Process:  Coherent and Goal Directed  Orientation:  Full (Time, Place, and Person)  Thought Content:  Denies any hallucinations, delusions, paranoia  Suicidal Thoughts:  No  Homicidal Thoughts:  No  Memory:  Immediate;  Good Recent;   Good Remote;   Good  Judgement:  Fair  Insight:  Fair  Psychomotor Activity:  Decreased  Concentration:  Fair  Recall:  Good  Fund of Knowledge:Good  Language: Good   Akathisia:  No  Handed:  Right  AIMS (if indicated):     Assets:  Desire for Improvement  Sleep:  Number of Hours: 4.25    Musculoskeletal: Strength & Muscle Tone: within normal limits and Complained of joint points related to gouty arthritis Gait & Station: Fairly steady Patient leans: N/A  Past Psychiatric History: Diagnosis: Alcohol related disorder, severe, Opioid related disorder, severe, Substance induced mood disorder   Hospitalizations: [facility] x 3   Outpatient Care: With Kevin [name]  Substance Abuse Care: Never been to one, but would like to go one after d/c  Self-Mutilation: Denies  Suicidal Attempts: Denies attempts  Violent Behaviors: None reported   Past Medical History:   Past Medical History  Diagnosis Date  . Arthritis   . Gout   . Alcohol abuse   . Hypertension   . Hyperthyroidism   . Diabetes mellitus without complication   . GERD (gastroesophageal reflux disease)    Cardiac History:  Thyroid ds, HTN, DM  Allergies:  No Known Allergies  PTA Medications: Prescriptions prior to admission  Medication Sig Dispense Refill  . cloNIDine (CATAPRES) 0.1 MG tablet Take 1 tablet (0.1 mg total) by mouth every 6 (six) hours as needed (withdrawl symptoms).  12 tablet  0  . colchicine 0.6 MG tablet Take 1 tablet (0.6 mg total) by mouth daily. For gout arthritis      . gabapentin (NEURONTIN) 100 MG capsule Take 1 capsule (100 mg total) by mouth 3 (three) times daily. For anxiety/anxiety  90 capsule  0  . lansoprazole (PREVACID) 30 MG capsule Take 1 capsule (30 mg total) by mouth daily. For acid reflux      . metFORMIN (GLUMETZA) 1000 MG (MOD) 24 hr tablet Take 1 tablet (1,000 mg total) by mouth daily with breakfast. For diabetes      . Olmesartan-Amlodipine-HCTZ (TRIBENZOR) 40-10-12.5 MG TABS Take 1 tablet by mouth daily. For hypertension  30 tablet    . zolpidem (AMBIEN) 5 MG tablet Take 1 tablet (5 mg total) by mouth at bedtime as needed for sleep.  7 tablet  0  .  citalopram (CELEXA) 10 MG tablet Take 1 tablet (10 mg total) by mouth daily. For depression  30 tablet  0  . hydrOXYzine (ATARAX/VISTARIL) 25 MG tablet Take 1 tablet (25 mg total) by mouth 3 (three) times daily as needed for anxiety.  90 tablet  0  . levothyroxine (SYNTHROID, LEVOTHROID) 100 MCG tablet Take 1 tablet (100 mcg total) by mouth daily. For thyroid hormone replacement        Previous Psychotropic Medications:  Medication/Dose  See medication lists above               Substance Abuse History in the last 12 months:  yes  Consequences of Substance Abuse: Medical Consequences:  Liver damage, Possible death by overdose Legal Consequences:  Arrests, jail time, Loss of driving privilege. Family Consequences:  Family discord, divorce and or separation.  Social History:  reports that he has never smoked. He has never used smokeless tobacco. He reports that he drinks alcohol. He reports that he uses illicit drugs (Benzodiazepines, Hydrocodone, and Oxycodone). Additional Social History: History of alcohol / drug use?: Yes Longest period of sobriety (when/how long): 1.5 yrs in 2009  Negative Consequences of Use: Personal relationships Name of Substance 1: alcohol 1 - Duration: ongoing 1 - Last Use / Amount: 01/03/14 2 - Duration: on and off for years; ongoing 2 - Last Use / Amount: 01/03/14  Current Place of Residence: [place], [place] of Birth:    Family Members: "I have no wife and or children"  Marital Status:  Single  Children: 0  Sons:  Daughters:  Relationships: Single  Education:  HS Charity fundraiser Problems/Performance: Completed high school  Religious Beliefs/Practices: NA  History of Abuse (Emotional/Phsycial/Sexual): Denies  Occupational Experiences: Retired  Nature conservation officer History:  None.  Legal History: None pending per patient's reports  Hobbies/Interests: None reported  Family History:  History reviewed. No pertinent family  history.  Results for orders placed during the hospital encounter of 01/03/14 (from the past 72 hour(s))  GLUCOSE, CAPILLARY     Status: Abnormal   Collection Time    01/04/14  1:07 AM      Result Value Range   Glucose-Capillary 127 (*) 70 - 99 mg/dL   Comment 1 Notify RN    GLUCOSE, CAPILLARY     Status: Abnormal   Collection Time    01/04/14  6:04 AM      Result Value Range   Glucose-Capillary 150 (*) 70 - 99 mg/dL   Psychological Evaluations:  Assessment:   DSM5: Schizophrenia Disorders:  NA Obsessive-Compulsive Disorders:  NA Trauma-Stressor Disorders:  NA Substance/Addictive Disorders:  Alcohol Related Disorder - Severe (303.90) and Opioid Disorder - Severe (304.00) Depressive Disorders:  Substance induced mood disorder   AXIS I:  Alcohol related disorder, severe, Opioid dependence, Substance induced mood disorder AXIS II:  Deferred AXIS III:   Past Medical History  Diagnosis Date  . Arthritis   . Gout   . Alcohol abuse   . Hypertension   . Hyperthyroidism   . Diabetes mellitus without complication   . GERD (gastroesophageal reflux disease)    AXIS IV:  Polysubstance dependence, Chronic health issues,  AXIS V:  1-10 persistent dangerousness to self and others present  Treatment Plan/Recommendations: 1. Admit for crisis management and stabilization, estimated length of stay 3-5 days.  2. Medication management to reduce current symptoms to base line and improve the patient's overall level of functioning, (a). Continue with detoxification treatment protocols already.                                 (b) Increased Citalopram from 10 mg to 20 mg daily for depression 3. Treat health problems as indicated, (c). Will obtain HGBA1C.                                                               (d). Initiate Glycemic control for DM management.  4. Develop treatment plan to decrease risk of relapse upon discharge and the need for readmission.  5. Psycho-social education  regarding relapse prevention and self care.  6. Health care follow up as needed for medical problems.  7. Review, reconcile, and reinstate any pertinent home medications for other health issues where appropriate. 8. Call for consults with hospitalist for any additional specialty patient care services as needed.  Treatment Plan Summary: Daily contact with patient  to assess and evaluate symptoms and progress in treatment Medication management Supportive approach/coping skills/relapse prevention Assess for detox needs Reassess and address the co morbidities Optimize treatment with psychotropics Current Medications:  Current Facility-Administered Medications  Medication Dose Route Frequency Provider Last Rate Last Dose  . acetaminophen (TYLENOL) tablet 650 mg  650 mg Oral Q6H PRN Laverle Hobby, PA-C      . alum & mag hydroxide-simeth (MAALOX/MYLANTA) 200-200-20 MG/5ML suspension 30 mL  30 mL Oral Q4H PRN Laverle Hobby, PA-C      . amLODipine (NORVASC) tablet 10 mg  10 mg Oral Daily Nicholaus Bloom, MD   10 mg at 01/04/14 0825  . chlordiazePOXIDE (LIBRIUM) capsule 25 mg  25 mg Oral Q6H PRN Laverle Hobby, PA-C   25 mg at 01/04/14 [id number]  . chlordiazePOXIDE (LIBRIUM) capsule 25 mg  25 mg Oral QID Laverle Hobby, PA-C   25 mg at 01/04/14 0825   Followed by  . [START ON 01/05/2014] chlordiazePOXIDE (LIBRIUM) capsule 25 mg  25 mg Oral TID Laverle Hobby, PA-C       Followed by  . [START ON 01/06/2014] chlordiazePOXIDE (LIBRIUM) capsule 25 mg  25 mg Oral BH-qamhs Spencer E Simon, PA-C       Followed by  . [START ON 01/08/2014] chlordiazePOXIDE (LIBRIUM) capsule 25 mg  25 mg Oral Daily Laverle Hobby, PA-C      . citalopram (CELEXA) tablet 10 mg  10 mg Oral Daily Laverle Hobby, PA-C   10 mg at 01/04/14 0825  . cloNIDine (CATAPRES) tablet 0.1 mg  0.1 mg Oral QID Laverle Hobby, PA-C   0.1 mg at 01/04/14 0825   Followed by  . [START ON 01/06/2014] cloNIDine (CATAPRES) tablet 0.1 mg  0.1 mg Oral  BH-qamhs Spencer E Simon, PA-C       Followed by  . [START ON 01/09/2014] cloNIDine (CATAPRES) tablet 0.1 mg  0.1 mg Oral QAC breakfast Laverle Hobby, PA-C      . colchicine tablet 0.6 mg  0.6 mg Oral Daily Laverle Hobby, PA-C   0.6 mg at 01/04/14 0825  . dicyclomine (BENTYL) tablet 20 mg  20 mg Oral Q6H PRN Laverle Hobby, PA-C      . gabapentin (NEURONTIN) capsule 100 mg  100 mg Oral TID Laverle Hobby, PA-C   100 mg at 01/04/14 0825  . hydrochlorothiazide (MICROZIDE) capsule 12.5 mg  12.5 mg Oral Daily Nicholaus Bloom, MD   12.5 mg at 01/04/14 0825  . hydrOXYzine (ATARAX/VISTARIL) tablet 25 mg  25 mg Oral Q6H PRN Laverle Hobby, PA-C      . insulin aspart (novoLOG) injection 0-15 Units  0-15 Units Subcutaneous TID WC Encarnacion Slates, NP      . insulin aspart (novoLOG) injection 4 Units  4 Units Subcutaneous TID WC Encarnacion Slates, NP      . irbesartan (AVAPRO) tablet 300 mg  300 mg Oral Daily Nicholaus Bloom, MD   300 mg at 01/04/14 [id number]  . levothyroxine (SYNTHROID, LEVOTHROID) tablet 100 mcg  100 mcg Oral Daily Laverle Hobby, PA-C   100 mcg at 01/04/14 0825  . loperamide (IMODIUM) capsule 2-4 mg  2-4 mg Oral PRN Laverle Hobby, PA-C      . magnesium hydroxide (MILK OF MAGNESIA) suspension 30 mL  30 mL Oral Daily PRN Laverle Hobby, PA-C      . metFORMIN (GLUCOPHAGE-XR) 24 hr tablet 1,000 mg  1,000 mg Oral Q breakfast Laverle Hobby, PA-C   1,000 mg at 01/04/14 [id number]  . methocarbamol (ROBAXIN) tablet 500 mg  500 mg Oral Q8H PRN Laverle Hobby, PA-C      . multivitamin with minerals tablet 1 tablet  1 tablet Oral Daily Laverle Hobby, PA-C   1 tablet at 01/04/14 1324  . ondansetron (ZOFRAN-ODT) disintegrating tablet 4 mg  4 mg Oral Q6H PRN Laverle Hobby, PA-C      . pantoprazole (PROTONIX) EC tablet 20 mg  20 mg Oral Daily Laverle Hobby, PA-C   20 mg at 01/04/14 4010  . thiamine (B-1) injection 100 mg  100 mg Intramuscular Once [employer], PA-C      . thiamine (VITAMIN B-1) tablet  100 mg  100 mg Oral Daily Laverle Hobby, PA-C   100 mg at 01/04/14 2725  . traZODone (DESYREL) tablet 50 mg  50 mg Oral QHS,MR X 1 Spencer E Simon, PA-C   50 mg at 01/04/14 0050    Observation Level/Precautions:  15 minute checks  Laboratory:  Reviewed current ED lab reports, (+) for opiates, benzos $ ETOH  Psychotherapy:  Group sessions, AA/NA meetings  Medications:  See medication lists  Consultations:  As needed  Discharge Concerns:  Maintaining sobriety  Estimated LOS: 2-4 days  Other:     I certify that inpatient services furnished can reasonably be expected to improve the patient's condition.   Encarnacion Slates, Sharon, FNP 2/5/201510:17 AM Personally evaluated the patient, reviewed his physical exam and agree with assessment and plan Geralyn Flash A. Sabra Heck, M.D.

## 2014-01-04 NOTE — [facility] Group Notes (Signed)
[place] LCSW Group Therapy  01/04/2014 2:37 PM  Type of Therapy:  Group Therapy  Participation Level:  Did Not Attend-pt in bed sleeping/did not wake up for group according to tech.   Smart, Alaijah Gibler LCSWA  01/04/2014, 2:37 PM

## 2014-01-04 NOTE — [facility] Suicide Risk Assessment (Signed)
[facility] INPATIENT: Family/Significant Other Suicide Prevention Education  Suicide Prevention Education:  Education Completed; No one has been identified by the patient as the family member/significant other with whom the patient will be residing, and identified as the person(s) who will aid the patient in the event of a mental health crisis (suicidal ideations/suicide attempt).   Pt did not c/o SI at admission, nor have they endorsed SI during their stay here. SPE not required. SPI pamphlet provided to pt and he was encouraged to share information with support network, ask questions, and talk about any concerns relating to SPE.  [place], LCSWA 01/04/2014 10:26 AM

## 2014-01-05 LAB — HEMOGLOBIN A1C
HEMOGLOBIN A1C: 5.7 % — AB (ref <5.7)
MEAN PLASMA GLUCOSE: 117 mg/dL — AB (ref <117)

## 2014-01-05 LAB — GLUCOSE, CAPILLARY
Glucose-Capillary: 148 mg/dL — ABNORMAL HIGH (ref 70–99)
Glucose-Capillary: 103 mg/dL — ABNORMAL HIGH (ref 70–99)
Glucose-Capillary: 124 mg/dL — ABNORMAL HIGH (ref 70–99)
Glucose-Capillary: 120 mg/dL — ABNORMAL HIGH (ref 70–99)

## 2014-01-05 MED ORDER — GLUCERNA SHAKE PO LIQD
237.0000 mL | Freq: Two times a day (BID) | ORAL | Status: DC
  Administered 2014-01-05 – 2014-01-16 (×13): 237 mL via ORAL

## 2014-01-05 NOTE — [facility] Group Notes (Signed)
Adult Psychoeducational Group Note  Date:  01/05/2014 Time:  10:34 PM  Group Topic/Focus:  AA Meeting  Participation Level:  Minimal  Participation Quality:  Drowsy  Affect:  Flat  Cognitive:  Appropriate  Insight: None  Engagement in Group:  Limited  Modes of Intervention:  Discussion and Education  Additional Comments:  Dejion attended [employer].  Victorino Sparrow A 01/05/2014, 10:34 PM

## 2014-01-05 NOTE — Progress Notes (Signed)
[facility] MD Progress Note  01/05/2014 5:45 PM Kevin [name]  MRN:  [id number] Subjective:  States that it has taken longer for him to get to feel right this time around.a admits that he was not eating well, was drinking. Admits to feeling weak, with aches and pains. States he realizes he cant continue to do this to himself.  Diagnosis:   DSM5: Schizophrenia Disorders:  none Obsessive-Compulsive Disorders:  none Trauma-Stressor Disorders:  none Substance/Addictive Disorders:  Alcohol Related Disorder - Severe (303.90) Depressive Disorders:  Major Depressive Disorder - Moderate (296.22) Total Time spent with patient: 30 minutes  Axis I: Generalized Anxiety Disorder  ADL's:  Intact  Sleep: Poor  Appetite:  improving  Suicidal Ideation:  Plan:  denies Intent:  denies Means:  denies Homicidal Ideation:  Plan:  denies Intent:  denies Means:  denies AEB (as evidenced by):  Psychiatric Specialty Exam: Physical Exam  Review of Systems  Constitutional: Positive for malaise/fatigue.  HENT: Negative.   Eyes: Negative.   Respiratory: Negative.   Cardiovascular: Negative.   Gastrointestinal: Negative.   Genitourinary: Negative.   Musculoskeletal: Positive for myalgias.  Skin: Negative.   Neurological: Positive for weakness.  Endo/Heme/Allergies: Negative.   Psychiatric/Behavioral: Positive for depression and substance abuse. The patient is nervous/anxious and has insomnia.     Blood pressure 98/63, pulse 68, temperature 98.5 F (36.9 C), temperature source Oral, resp. rate 20, height 5' 9.5" (1.765 m), weight 103.42 kg (228 lb).Body mass index is 33.2 kg/(m^2).  General Appearance: Disheveled and in bed  Eye Contact::  Fair  Speech:  Clear and Coherent and Slow  Volume:  Decreased  Mood:  Anxious and Depressed  Affect:  Restricted  Thought Process:  Coherent and Goal Directed  Orientation:  Full (Time, Place, and Person)  Thought Content:  symptoms, worries, concerns  Suicidal  Thoughts:  No  Homicidal Thoughts:  No  Memory:  Immediate;   Fair Recent;   Fair Remote;   Fair  Judgement:  Fair  Insight:  Shallow  Psychomotor Activity:  Decreased  Concentration:  Fair  Recall:  [employer] of Knowledge:Fair  Language: Fair  Akathisia:  No  Handed:    AIMS (if indicated):     Assets:  Desire for Improvement  Sleep:  Number of Hours: 6.25   Musculoskeletal: Strength & Muscle Tone: within normal limits Gait & Station: normal Patient leans: N/A  Current Medications: Current Facility-Administered Medications  Medication Dose Route Frequency Provider Last Rate Last Dose  . acetaminophen (TYLENOL) tablet 650 mg  650 mg Oral Q6H PRN Laverle Hobby, PA-C      . alum & mag hydroxide-simeth (MAALOX/MYLANTA) 200-200-20 MG/5ML suspension 30 mL  30 mL Oral Q4H PRN Laverle Hobby, PA-C      . amLODipine (NORVASC) tablet 10 mg  10 mg Oral Daily Nicholaus Bloom, MD   10 mg at 01/05/14 [id number]  . chlordiazePOXIDE (LIBRIUM) capsule 25 mg  25 mg Oral Q6H PRN Laverle Hobby, PA-C   25 mg at 01/04/14 [id number]  . chlordiazePOXIDE (LIBRIUM) capsule 25 mg  25 mg Oral TID Laverle Hobby, PA-C   25 mg at 01/05/14 1719   Followed by  . [START ON 01/06/2014] chlordiazePOXIDE (LIBRIUM) capsule 25 mg  25 mg Oral BH-qamhs Spencer E Simon, PA-C       Followed by  . [START ON 01/08/2014] chlordiazePOXIDE (LIBRIUM) capsule 25 mg  25 mg Oral Daily Laverle Hobby, PA-C      .  citalopram (CELEXA) tablet 20 mg  20 mg Oral Daily Encarnacion Slates, NP   20 mg at 01/05/14 0808  . cloNIDine (CATAPRES) tablet 0.1 mg  0.1 mg Oral QID Laverle Hobby, PA-C   0.1 mg at 01/05/14 1200   Followed by  . [START ON 01/06/2014] cloNIDine (CATAPRES) tablet 0.1 mg  0.1 mg Oral BH-qamhs Spencer E Simon, PA-C       Followed by  . [START ON 01/09/2014] cloNIDine (CATAPRES) tablet 0.1 mg  0.1 mg Oral QAC breakfast Laverle Hobby, PA-C      . colchicine tablet 0.6 mg  0.6 mg Oral Daily Laverle Hobby, PA-C   0.6 mg at 01/05/14  0808  . dicyclomine (BENTYL) tablet 20 mg  20 mg Oral Q6H PRN Laverle Hobby, PA-C      . feeding supplement (GLUCERNA SHAKE) (GLUCERNA SHAKE) liquid 237 mL  237 mL Oral BID BM Christie Beckers, RD   237 mL at 01/05/14 1719  . gabapentin (NEURONTIN) capsule 100 mg  100 mg Oral TID Laverle Hobby, PA-C   100 mg at 01/05/14 1718  . hydrochlorothiazide (MICROZIDE) capsule 12.5 mg  12.5 mg Oral Daily Nicholaus Bloom, MD   12.5 mg at 01/05/14 0808  . hydrOXYzine (ATARAX/VISTARIL) tablet 25 mg  25 mg Oral Q6H PRN Laverle Hobby, PA-C      . insulin aspart (novoLOG) injection 0-15 Units  0-15 Units Subcutaneous TID WC Encarnacion Slates, NP   2 Units at 01/05/14 1721  . insulin aspart (novoLOG) injection 4 Units  4 Units Subcutaneous TID WC Encarnacion Slates, NP   4 Units at 01/05/14 1721  . irbesartan (AVAPRO) tablet 300 mg  300 mg Oral Daily Nicholaus Bloom, MD   300 mg at 01/05/14 0807  . levothyroxine (SYNTHROID, LEVOTHROID) tablet 100 mcg  100 mcg Oral Daily Laverle Hobby, PA-C   100 mcg at 01/05/14 4431  . loperamide (IMODIUM) capsule 2-4 mg  2-4 mg Oral PRN Laverle Hobby, PA-C      . magnesium hydroxide (MILK OF MAGNESIA) suspension 30 mL  30 mL Oral Daily PRN Laverle Hobby, PA-C      . metFORMIN (GLUCOPHAGE-XR) 24 hr tablet 1,000 mg  1,000 mg Oral Q breakfast Laverle Hobby, PA-C   1,000 mg at 01/05/14 0807  . methocarbamol (ROBAXIN) tablet 500 mg  500 mg Oral Q8H PRN Laverle Hobby, PA-C      . multivitamin with minerals tablet 1 tablet  1 tablet Oral Daily Laverle Hobby, PA-C   1 tablet at 01/05/14 5400  . ondansetron (ZOFRAN-ODT) disintegrating tablet 4 mg  4 mg Oral Q6H PRN Laverle Hobby, PA-C      . pantoprazole (PROTONIX) EC tablet 20 mg  20 mg Oral Daily Laverle Hobby, PA-C   20 mg at 01/05/14 8676  . thiamine (B-1) injection 100 mg  100 mg Intramuscular Once [employer], PA-C      . thiamine (VITAMIN B-1) tablet 100 mg  100 mg Oral Daily Laverle Hobby, PA-C   100 mg at 01/05/14  0808  . traZODone (DESYREL) tablet 50 mg  50 mg Oral QHS,MR X 1 Laverle Hobby, PA-C   50 mg at 01/04/14 2211    Lab Results:  Results for orders placed during the hospital encounter of 01/03/14 (from the past 48 hour(s))  GLUCOSE, CAPILLARY     Status: Abnormal   Collection Time  01/04/14  1:07 AM      Result Value Range   Glucose-Capillary 127 (*) 70 - 99 mg/dL   Comment 1 Notify RN    GLUCOSE, CAPILLARY     Status: Abnormal   Collection Time    01/04/14  6:04 AM      Result Value Range   Glucose-Capillary 150 (*) 70 - 99 mg/dL  HEMOGLOBIN A1C     Status: Abnormal   Collection Time    01/04/14 10:11 AM      Result Value Range   Hemoglobin A1C 5.7 (*) <5.7 %   Comment: (NOTE)                                                                               According to the ADA Clinical Practice Recommendations for 2011, when     HbA1c is used as a screening test:      >=6.5%   Diagnostic of Diabetes Mellitus               (if abnormal result is confirmed)     5.7-6.4%   Increased risk of developing Diabetes Mellitus     References:Diagnosis and Classification of Diabetes Mellitus,Diabetes     ZOXW,9604,54(UJWJX 1):S62-S69 and Standards of Medical Care in             Diabetes - 2011,Diabetes Care,2011,34 (Suppl 1):S11-S61.   Mean Plasma Glucose 117 (*) <117 mg/dL   Comment: Performed at [facility], CAPILLARY     Status: Abnormal   Collection Time    01/04/14 11:32 AM      Result Value Range   Glucose-Capillary 139 (*) 70 - 99 mg/dL   Comment 1 Notify RN    GLUCOSE, CAPILLARY     Status: Abnormal   Collection Time    01/04/14  4:40 PM      Result Value Range   Glucose-Capillary 122 (*) 70 - 99 mg/dL   Comment 1 Notify RN    GLUCOSE, CAPILLARY     Status: Abnormal   Collection Time    01/04/14 10:11 PM      Result Value Range   Glucose-Capillary 112 (*) 70 - 99 mg/dL   Comment 1 Notify RN     Comment 2 Documented in Chart    GLUCOSE, CAPILLARY      Status: Abnormal   Collection Time    01/05/14  6:42 AM      Result Value Range   Glucose-Capillary 148 (*) 70 - 99 mg/dL  GLUCOSE, CAPILLARY     Status: Abnormal   Collection Time    01/05/14 11:58 AM      Result Value Range   Glucose-Capillary 103 (*) 70 - 99 mg/dL   Comment 1 Notify RN    GLUCOSE, CAPILLARY     Status: Abnormal   Collection Time    01/05/14  5:01 PM      Result Value Range   Glucose-Capillary 124 (*) 70 - 99 mg/dL    Physical Findings: AIMS: Facial and Oral Movements Muscles of Facial Expression: None, normal Lips and Perioral Area: None, normal Jaw: None, normal Tongue: None, normal,Extremity Movements Upper (arms, wrists, hands, fingers):  None, normal Lower (legs, knees, ankles, toes): None, normal, Trunk Movements Neck, shoulders, hips: None, normal, Overall Severity Severity of abnormal movements (highest score from questions above): None, normal Incapacitation due to abnormal movements: None, normal Patient's awareness of abnormal movements (rate only patient's report): No Awareness, Dental Status Current problems with teeth and/or dentures?: No Does patient usually wear dentures?: No  CIWA:  CIWA-Ar Total: 3 COWS:     Treatment Plan Summary: Daily contact with patient to assess and evaluate symptoms and progress in treatment Medication management  Plan: Supportive approach/coping skills/relapse prevention           Continue detox           Re asess and address the co morbidities  Medical Decision Making Problem Points:  Review of psycho-social stressors (1) Data Points:  Review of medication regiment & side effects (2) Review of new medications or change in dosage (2)  I certify that inpatient services furnished can reasonably be expected to improve the patient's condition.   [place] A 01/05/2014, 5:45 PM

## 2014-01-05 NOTE — Progress Notes (Signed)
NUTRITION ASSESSMENT  Pt identified as at risk on the Malnutrition Screen Tool  INTERVENTION: 1. Educated patient on the importance of nutrition and encouraged intake of food and beverages. 2. Discussed importance of following diabetic diet for overall health, pt denies any diabetic diet educational needs at this time.  3. Supplements: Glucerna shakes BID  NUTRITION DIAGNOSIS: Unintentional weight loss related to sub-optimal intake as evidenced by pt report.   Goal: Pt to meet >/= 90% of their estimated nutrition needs.  Monitor:  PO intake  Assessment:  Pt admitted with request for opoid detox. Has hx of DM. Met with pt who reports eating almost nothing for the past week due to poor appetite. Denies any significant changes in weight. Admitted on admission to drinking daily. Getting daily multivitamin. Appetite improving.   68 y.o. male  Height: Ht Readings from Last 1 Encounters:  01/03/14 5' 9.5" (1.765 m)    Weight: Wt Readings from Last 1 Encounters:  01/03/14 228 lb (103.42 kg)    Weight Hx: Wt Readings from Last 10 Encounters:  01/03/14 228 lb (103.42 kg)  01/03/14 234 lb (106.142 kg)  01/24/13 234 lb (106.142 kg)    BMI:  Body mass index is 33.2 kg/(m^2). Pt meets criteria for class I obesity based on current BMI.  Estimated Nutritional Needs: Kcal: 25-30 kcal/kg Protein: > 1 gram protein/kg Fluid: 1 ml/kcal  Diet Order: Carb Control Pt is also offered choice of unit snacks mid-morning and mid-afternoon.  Pt is eating as desired.   Lab results and medications reviewed.   Mikey College MS, [place], [place] Pager [phone] After Hours Pager

## 2014-01-05 NOTE — Progress Notes (Signed)
D: Patient denies SI/HI and auditory and visual hallucinations. Patient has a depressed mood and affect. Patient isolating to room and resting throughout day. Patient states that he is "tired from detoxing." Patient also stating that he does not want to be on insulin while in hospital. Patient states that he only takes "metformin at home" and that this "works" for him.  A: Patient given emotional support from RN. Patient given medications per MD orders. Patient encouraged to attend groups and unit activities. Patient encouraged to come to staff with any questions or concerns. Patient encouraged to speak with MD/NP regarding insulin.  R: Patient remains cooperative and appropriate. Will continue to monitor patient for safety.

## 2014-01-05 NOTE — BHH Group Notes (Signed)
[facility] LCSW Aftercare Discharge Planning Group Note   01/05/2014 9:41 AM  Participation Quality:  Disruptive/redirectable.   Mood/Affect:  Depressed and Flat and irritable    Depression Rating:  7  Anxiety Rating:  8  Thoughts of Suicide:  No Will you contract for safety?   NA  Current AVH:  No  Plan for Discharge/Comments:  Pt reports that he feels weak this morning and reports high anxiety/depression. Pt irritable and disruptive during group but was able to be redirected by CSW. Pt reports that he plans to follow up at [facility] for med management only and is not interested in SA IOP, therapy, or AA. Pt stated that recent death of sister triggered relapse but acknowledged the importance of taking responsibility for coping in the "wrong way." Pt given info to Hospice grief counseling in [facility] and was encouraged by CSW to look into this free resource.   Transportation Means: unknown at this time.   Supports: some family supports identified.   Smart, [employer]

## 2014-01-05 NOTE — Progress Notes (Signed)
D.  Pt pleasant but flat on approach.  Denies complaints at this time.  Pt positive for evening AA group, interacting appropriately on unit.  Denies SI/HI/hallucinations at this time.  A.  Support and encouragement offered  R.  Pt remains safe on unit, will continue to monitor.

## 2014-01-05 NOTE — Progress Notes (Signed)
Adult Psychoeducational Group Note  Date:  01/05/2014 Time:  12:54 PM  Group Topic/Focus:  Relapse Prevention Planning:   The focus of this group is to define relapse and discuss the need for planning to combat relapse.  Participation Level:  Did Not Attend  Additional Comments:  Pt was in bed asleep.  Jannelly Bergren C 01/05/2014, 12:54 PM 

## 2014-01-05 NOTE — Tx Team (Signed)
Interdisciplinary Treatment Plan Update (Adult)  Date: 01/05/2014   Time Reviewed: 11:00 AM  Progress in Treatment:  Attending groups: Yes  Participating in groups:  Yes  Taking medication as prescribed: Yes  Tolerating medication: Yes  Family/Significant othe contact made: No. SPE not required for this pt.  Patient understands diagnosis: Yes, AEB seeking treatment for mood stabilization and ETOH detox.  Discussing patient identified problems/goals with staff: Yes  Medical problems stabilized or resolved: Yes  Denies suicidal/homicidal ideation: Yes  Patient has not harmed self or Others: Yes  New problem(s) identified: Pt reporting weakness today.  Discharge Plan or Barriers: Pt plans to return home and follow up o/p at [name] for med management.  Additional comments: This is a 68 year old African-American male. Admitted to [facility] from the [facility] ED with a request for opioid detox. Patient reports, "A friend carried me over to the [facility] ED yesterday because I was having anxiety attacks.I don't know what id causing it. I know that I have depressed all my life. I never participated in weddings and or any other occasions because such activities make my anxiety go bad. My depression gets the best of me. I buy pills of the streets to numb and calm me". Mr. Kevin [name] say he would try something for depression. Reports being sober for 27 years. Reason for Continuation of Hospitalization: Librium taper - withdrawals Mood stabilization Clonidine taper-withdrawals Medication management  Estimated length of stay: 3-4 days  For review of initial/current patient goals, please see plan of care.  Attendees:  Patient:    Family:    Physician: Carlton Adam MD 01/05/2014 11:00 AM   Nursing: Marye Round RN 01/05/2014 11:00 AM   Clinical Social Worker [place], [place]  01/05/2014 11:00 AM   Other: Hardie Pulley. PA  01/05/2014 11:00 AM   Other: Gerline Legacy Nurse CM 01/05/2014 11:00 AM    Other:     Other:    Scribe for Treatment Team:  [place] LCSWA 01/05/2014 11:00 AM

## 2014-01-05 NOTE — BHH Group Notes (Signed)
[place] LCSW Group Therapy  01/05/2014 1:57 PM  Type of Therapy:  Group Therapy  Participation Level:  Active  Participation Quality:  Attentive  Affect:  Depressed and Flat  Cognitive:  Oriented  Insight:  Improving  Engagement in Therapy:  Improving  Modes of Intervention:  Discussion, Education, Exploration, Problem-solving, Rapport Building, Socialization and Support  Summary of Progress/Problems: Feelings around Relapse. Group members discussed the meaning of relapse and shared personal stories of relapse, how it affected them and others, and how they perceived themselves during this time. Group members were encouraged to identify triggers, warning signs and coping skills used when facing the possibility of relapse. Social supports were discussed and explored in detail. Post Acute Withdrawal Syndrome (handout provided) was introduced and examined. Pt's were encouraged to ask questions, talk about key points associated with PAWS, and process this information in terms of relapse prevention. Kevin [name] was attentive and engaged throughout today's therapy group. He shared that he has experiences embarrassment and shame due to his recent relapse. "My sister died and I made the bad decision to pick up a drink rather than get help and talk to someone." Kevin [name] shows progress in the group setting and improving insight AEB his ability to process how creating a safety plan for PAWS will help him remain sober.     Smart, Pegge Cumberledge LCSWA 01/05/2014, 1:57 PM

## 2014-01-06 DIAGNOSIS — F10939 Alcohol use, unspecified with withdrawal, unspecified: Principal | ICD-10-CM

## 2014-01-06 DIAGNOSIS — F332 Major depressive disorder, recurrent severe without psychotic features: Secondary | ICD-10-CM

## 2014-01-06 DIAGNOSIS — F10239 Alcohol dependence with withdrawal, unspecified: Principal | ICD-10-CM

## 2014-01-06 DIAGNOSIS — F1010 Alcohol abuse, uncomplicated: Secondary | ICD-10-CM

## 2014-01-06 DIAGNOSIS — F1910 Other psychoactive substance abuse, uncomplicated: Secondary | ICD-10-CM

## 2014-01-06 DIAGNOSIS — F1120 Opioid dependence, uncomplicated: Secondary | ICD-10-CM

## 2014-01-06 DIAGNOSIS — F411 Generalized anxiety disorder: Secondary | ICD-10-CM

## 2014-01-06 LAB — GLUCOSE, CAPILLARY
GLUCOSE-CAPILLARY: 122 mg/dL — AB (ref 70–99)
Glucose-Capillary: 137 mg/dL — ABNORMAL HIGH (ref 70–99)
Glucose-Capillary: 133 mg/dL — ABNORMAL HIGH (ref 70–99)
Glucose-Capillary: 110 mg/dL — ABNORMAL HIGH (ref 70–99)

## 2014-01-06 MED ORDER — METFORMIN HCL ER 500 MG PO TB24
1000.0000 mg | ORAL_TABLET | ORAL | Status: DC
  Administered 2014-01-07 – 2014-01-16 (×10): 1000 mg via ORAL
  Filled 2014-01-06 (×12): qty 2

## 2014-01-06 NOTE — Progress Notes (Signed)
D:  Patient up and in the milieu at times.  Has spent a lot of time in his room, but is attending some groups.  Denies suicidal thoughts.  Affect is flat/blunted.  Denies any thoughts of self harm.   A:  Medications given as prescribed.  Encouraged participation in groups as he is able.  Insulin given per sliding scale schedule.  Offered support and encouragement.   R:  Cooperative with staff.  Interacting some with peers.  Tolerating current medication regime.  Safety is maintained.

## 2014-01-06 NOTE — BHH Group Notes (Signed)
[place] Group Notes:  (Clinical Social Work)  01/06/2014     10-11AM  Summary of Progress/Problems:   The main focus of today's process group was for the patient to identify ways in which they have in the past sabotaged their own recovery. Motivational Interviewing and a worksheet were utilized to help patients explore the perceived benefits and costs of their substance use, as well as the potential benefits and costs of stopping.  The Stages of Change were explained using a handout, and patients identified where they currently are with regard to stages of change.  The patient expressed that he self-sabotages with alcohol and by not following doctor orders in order to improve his health.  He does this because he wants to be in control of his own life.  He stated he is in Preparation stage, was very conversant during group.  Type of Therapy:  Group Therapy - Process   Participation Level:  Active  Participation Quality:  Attentive and Sharing  Affect:  Blunted  Cognitive:  Appropriate and Oriented  Insight:  Engaged  Engagement in Therapy:  Engaged  Modes of Intervention:  Education, Psychiatric nurse, Motivational Interviewing  Selmer Dominion, LCSW 01/06/2014, 11:58 AM

## 2014-01-06 NOTE — Progress Notes (Signed)
Psychoeducational Group Note  Date:  01/06/2014 Time:  0115 pm  Group Topic/Focus:  Identifying Needs:   The focus of this group is to help patients identify their personal needs that have been historically problematic and identify healthy behaviors to address their needs.  Participation Level:  Did Not Attend   Kevin [name] 01/06/2014,3:45 PM

## 2014-01-06 NOTE — Progress Notes (Signed)
[name] Progress Note  01/06/2014 9:22 AM Kevin [name]  MRN:  [id number] Subjective:  Patient lying in bed this morning and complains of feeling tired and low energy despite sleeping well.  Low blood pressure, fluids encouraged.  Appetite is fair, withdrawal symptoms "not as much".  "Depressed all my life." Diagnosis:   DSM5:  Substance/Addictive Disorders:  Alcohol Related Disorder - Severe (303.90), Alcohol Intoxication with Use Disorder - Severe (F10.229), Alcohol Withdrawal (291.81) and Opioid Disorder - Severe (304.00) Depressive Disorders:  Major Depressive Disorder - Severe (296.23) Total Time spent with patient: 20 minutes  Axis I: Alcohol Abuse and Major Depression, Recurrent severe; Substance abuse Axis II: Deferred Axis III:  Past Medical History  Diagnosis Date  . Arthritis   . Gout   . Alcohol abuse   . Hypertension   . Hyperthyroidism   . Diabetes mellitus without complication   . GERD (gastroesophageal reflux disease)    Axis IV: other psychosocial or environmental problems, problems related to social environment and problems with primary support group Axis V: 41-50 serious symptoms  ADL's:  Intact  Sleep: Good  Appetite:  Fair  Suicidal Ideation:  Denies  Homicidal Ideation:  Denies   Psychiatric Specialty Exam: Physical Exam  Constitutional: He is oriented to person, place, and time. He appears well-developed and well-nourished.  HENT:  Head: Normocephalic and atraumatic.  Neck: Normal range of motion.  Respiratory: Effort normal.  GI: Soft.  Musculoskeletal: Normal range of motion.  Neurological: He is alert and oriented to person, place, and time.  Skin: Skin is warm and dry.    Review of Systems  Constitutional: Positive for malaise/fatigue.  HENT: Negative.   Eyes: Negative.   Respiratory: Negative.   Cardiovascular: Negative.   Gastrointestinal: Negative.   Genitourinary: Negative.   Musculoskeletal: Negative.   Skin: Negative.    Neurological: Negative.   Endo/Heme/Allergies: Negative.   Psychiatric/Behavioral: Positive for depression and substance abuse. The patient is nervous/anxious.     Blood pressure 91/59, pulse 89, temperature 98.2 F (36.8 C), temperature source Oral, resp. rate 20, height 5' 9.5" (1.765 m), weight 103.42 kg (228 lb).Body mass index is 33.2 kg/(m^2).  General Appearance: Casual  Eye Contact::  Minimal  Speech:  Slow  Volume:  Decreased  Mood:  Depressed  Affect:  Congruent  Thought Process:  Coherent  Orientation:  Full (Time, Place, and Person)  Thought Content:  WDL  Suicidal Thoughts:  No  Homicidal Thoughts:  No  Memory:  Immediate;   Fair Recent;   Fair Remote;   Fair  Judgement:  Poor  Insight:  Fair  Psychomotor Activity:  Decreased  Concentration:  Fair  Recall:  [employer] of Knowledge:Fair  Language: Fair  Akathisia:  No  Handed:  Right  AIMS (if indicated):     Assets:  Resilience  Sleep:  Number of Hours: 6.75   Musculoskeletal: Strength & Muscle Tone: within normal limits Gait & Station: normal Patient leans: N/A  Current Medications: Current Facility-Administered Medications  Medication Dose Route Frequency Provider Last Rate Last Dose  . acetaminophen (TYLENOL) tablet 650 mg  650 mg Oral Q6H PRN Laverle Hobby, PA-C      . alum & mag hydroxide-simeth (MAALOX/MYLANTA) 200-200-20 MG/5ML suspension 30 mL  30 mL Oral Q4H PRN Laverle Hobby, PA-C      . amLODipine (NORVASC) tablet 10 mg  10 mg Oral Daily Nicholaus Bloom, MD   10 mg at 01/06/14 0806  . chlordiazePOXIDE (LIBRIUM)  capsule 25 mg  25 mg Oral Q6H PRN Laverle Hobby, PA-C   25 mg at 01/05/14 2131  . chlordiazePOXIDE (LIBRIUM) capsule 25 mg  25 mg Oral BH-qamhs Laverle Hobby, PA-C       Followed by  . [START ON 01/08/2014] chlordiazePOXIDE (LIBRIUM) capsule 25 mg  25 mg Oral Daily Laverle Hobby, PA-C      . citalopram (CELEXA) tablet 20 mg  20 mg Oral Daily Encarnacion Slates, NP   20 mg at 01/06/14  0806  . cloNIDine (CATAPRES) tablet 0.1 mg  0.1 mg Oral QID Laverle Hobby, PA-C   0.1 mg at 01/06/14 2725   Followed by  . cloNIDine (CATAPRES) tablet 0.1 mg  0.1 mg Oral BH-qamhs Laverle Hobby, PA-C       Followed by  . [START ON 01/09/2014] cloNIDine (CATAPRES) tablet 0.1 mg  0.1 mg Oral QAC breakfast Laverle Hobby, PA-C      . colchicine tablet 0.6 mg  0.6 mg Oral Daily Laverle Hobby, PA-C   0.6 mg at 01/06/14 0808  . dicyclomine (BENTYL) tablet 20 mg  20 mg Oral Q6H PRN Laverle Hobby, PA-C      . feeding supplement (GLUCERNA SHAKE) (GLUCERNA SHAKE) liquid 237 mL  237 mL Oral BID BM Christie Beckers, RD   237 mL at 01/05/14 1719  . gabapentin (NEURONTIN) capsule 100 mg  100 mg Oral TID Laverle Hobby, PA-C   100 mg at 01/06/14 3664  . hydrochlorothiazide (MICROZIDE) capsule 12.5 mg  12.5 mg Oral Daily Nicholaus Bloom, MD   12.5 mg at 01/06/14 4034  . hydrOXYzine (ATARAX/VISTARIL) tablet 25 mg  25 mg Oral Q6H PRN Laverle Hobby, PA-C      . insulin aspart (novoLOG) injection 0-15 Units  0-15 Units Subcutaneous TID WC Encarnacion Slates, NP   2 Units at 01/06/14 7425  . insulin aspart (novoLOG) injection 4 Units  4 Units Subcutaneous TID WC Encarnacion Slates, NP   4 Units at 01/06/14 [phone]  . irbesartan (AVAPRO) tablet 300 mg  300 mg Oral Daily Nicholaus Bloom, MD   300 mg at 01/06/14 8756  . levothyroxine (SYNTHROID, LEVOTHROID) tablet 100 mcg  100 mcg Oral Daily Laverle Hobby, PA-C   100 mcg at 01/06/14 4332  . loperamide (IMODIUM) capsule 2-4 mg  2-4 mg Oral PRN Laverle Hobby, PA-C      . magnesium hydroxide (MILK OF MAGNESIA) suspension 30 mL  30 mL Oral Daily PRN Laverle Hobby, PA-C      . metFORMIN (GLUCOPHAGE-XR) 24 hr tablet 1,000 mg  1,000 mg Oral Q breakfast Laverle Hobby, PA-C   1,000 mg at 01/06/14 9518  . methocarbamol (ROBAXIN) tablet 500 mg  500 mg Oral Q8H PRN Laverle Hobby, PA-C      . multivitamin with minerals tablet 1 tablet  1 tablet Oral Daily Laverle Hobby, PA-C    1 tablet at 01/06/14 0807  . ondansetron (ZOFRAN-ODT) disintegrating tablet 4 mg  4 mg Oral Q6H PRN Laverle Hobby, PA-C      . pantoprazole (PROTONIX) EC tablet 20 mg  20 mg Oral Daily Laverle Hobby, PA-C   20 mg at 01/06/14 0806  . thiamine (B-1) injection 100 mg  100 mg Intramuscular Once [employer], PA-C      . thiamine (VITAMIN B-1) tablet 100 mg  100 mg Oral Daily Laverle Hobby, PA-C  100 mg at 01/06/14 0806  . traZODone (DESYREL) tablet 50 mg  50 mg Oral QHS,MR X 1 Laverle Hobby, PA-C   50 mg at 01/05/14 2131    Lab Results:  Results for orders placed during the hospital encounter of 01/03/14 (from the past 48 hour(s))  HEMOGLOBIN A1C     Status: Abnormal   Collection Time    01/04/14 10:11 AM      Result Value Range   Hemoglobin A1C 5.7 (*) <5.7 %   Comment: (NOTE)                                                                               According to the ADA Clinical Practice Recommendations for 2011, when     HbA1c is used as a screening test:      >=6.5%   Diagnostic of Diabetes Mellitus               (if abnormal result is confirmed)     5.7-6.4%   Increased risk of developing Diabetes Mellitus     References:Diagnosis and Classification of Diabetes Mellitus,Diabetes     [id number] 1):S62-S69 and Standards of Medical Care in             Diabetes - 2011,Diabetes Care,2011,34 (Suppl 1):S11-S61.   Mean Plasma Glucose 117 (*) <117 mg/dL   Comment: Performed at [place], [facility]     Status: Abnormal   Collection Time    01/04/14 11:32 AM      Result Value Range   Glucose-Capillary 139 (*) 70 - 99 mg/dL   Comment 1 Notify RN    GLUCOSE, CAPILLARY     Status: Abnormal   Collection Time    01/04/14  4:40 PM      Result Value Range   Glucose-Capillary 122 (*) 70 - 99 mg/dL   Comment 1 Notify RN    GLUCOSE, CAPILLARY     Status: Abnormal   Collection Time    01/04/14 10:11 PM      Result Value Range   Glucose-Capillary 112  (*) 70 - 99 mg/dL   Comment 1 Notify RN     Comment 2 Documented in Chart    GLUCOSE, CAPILLARY     Status: Abnormal   Collection Time    01/05/14  6:42 AM      Result Value Range   Glucose-Capillary 148 (*) 70 - 99 mg/dL  GLUCOSE, CAPILLARY     Status: Abnormal   Collection Time    01/05/14 11:58 AM      Result Value Range   Glucose-Capillary 103 (*) 70 - 99 mg/dL   Comment 1 Notify RN    GLUCOSE, CAPILLARY     Status: Abnormal   Collection Time    01/05/14  5:01 PM      Result Value Range   Glucose-Capillary 124 (*) 70 - 99 mg/dL  GLUCOSE, CAPILLARY     Status: Abnormal   Collection Time    01/05/14  9:15 PM      Result Value Range   Glucose-Capillary 120 (*) 70 - 99 mg/dL  GLUCOSE, CAPILLARY     Status: Abnormal   Collection  Time    01/06/14  6:05 AM      Result Value Range   Glucose-Capillary 137 (*) 70 - 99 mg/dL    Physical Findings: AIMS: Facial and Oral Movements Muscles of Facial Expression: None, normal Lips and Perioral Area: None, normal Jaw: None, normal Tongue: None, normal,Extremity Movements Upper (arms, wrists, hands, fingers): None, normal Lower (legs, knees, ankles, toes): None, normal, Trunk Movements Neck, shoulders, hips: None, normal, Overall Severity Severity of abnormal movements (highest score from questions above): None, normal Incapacitation due to abnormal movements: None, normal Patient's awareness of abnormal movements (rate only patient's report): No Awareness, Dental Status Current problems with teeth and/or dentures?: No Does patient usually wear dentures?: No  CIWA:  CIWA-Ar Total: 0 COWS:     Treatment Plan Summary: Daily contact with patient to assess and evaluate symptoms and progress in treatment Medication management  Plan:  Review of chart, vital signs, medications, and notes. 1-Individual and group therapy 2-Medication management for depression and anxiety:  Medications reviewed with the patient and he denied side  effects, no changes made Celexa 20 mg daily for depression Librium and Clonidine detox protocol for alcohol and opiate detox 3-Coping skills for depression, anxiety, and substance dependency 4-Continue crisis stabilization and management 5-Address health issues--monitoring vital signs, stable 6-Treatment plan in progress to prevent relapse of depression, substance use, and anxiety  Medical Decision Making Problem Points:  Established problem, stable/improving (1) and Review of psycho-social stressors (1) Data Points:  Review of medication regiment & side effects (2)  I certify that inpatient services furnished can reasonably be expected to improve the patient's condition.   Waylan Boga, [place] 01/06/2014, 9:22 AM  I agreed with the findings, treatment and disposition plan of this patient. Berniece Andreas, MD

## 2014-01-06 NOTE — Progress Notes (Signed)
Adult Psychoeducational Group Note  Date:  01/06/2014 Time:  10:34 PM  Group Topic/Focus:  AA Meeting  Participation Level:  Minimal  Participation Quality:  Appropriate  Affect:  Appropriate  Cognitive:  Appropriate  Insight: Appropriate  Engagement in Group:  Developing/Improving  Modes of Intervention:  Socialization  Additional Comments:  Pt was present the entire meeting. Did not share however.   Kaya Klausing 01/06/2014, 10:34 PM

## 2014-01-06 NOTE — Progress Notes (Signed)
D.  Pt presents with flat affect, but does brighten on approach.  Pt concerned about medication, states he doesn't use insulin at home, only his metformin, and also asked about his gout medication and when it will be received.  Pt was positive for evening AA group, and is interacting appropriately within milieu.  Denies SI/HI/hallucinations at this time.  A.  Support and encouragement offered.  Re-assured Pt that he will be getting his gout medication in the AM and also will receive his metformin.  R.  Pt pleased to know about his medication, denied other complaints.  Will continue to monitor.

## 2014-01-07 LAB — GLUCOSE, CAPILLARY
GLUCOSE-CAPILLARY: 119 mg/dL — AB (ref 70–99)
GLUCOSE-CAPILLARY: 101 mg/dL — AB (ref 70–99)
GLUCOSE-CAPILLARY: 122 mg/dL — AB (ref 70–99)
Glucose-Capillary: 117 mg/dL — ABNORMAL HIGH (ref 70–99)

## 2014-01-07 NOTE — Progress Notes (Signed)
[place] Group Notes:  (Nursing/MHT/Case Management/Adjunct)  Date:  01/07/2014  Time:  11:57 PM  Type of Therapy:  wrap up group  Participation Level:  Active  Participation Quality:  Appropriate, Attentive, Sharing and Supportive  Affect:  Appropriate  Cognitive:  Appropriate  Insight:  Appropriate  Engagement in Group:  Engaged  Modes of Intervention:  Clarification, Education and Support  Summary of Progress/Problems: Pt reports having a good support system of family and friends, all of whom do not drink.  Kevin [name] 01/07/2014, 11:57 PM

## 2014-01-07 NOTE — Progress Notes (Signed)
D.  Pt pleasant on approach, flat affect but brightens with interaction.  Positive for evening wrap up group, interacting appropriately within unit.  Pt pleased with medication changes, and states that he can already tell a difference with his gout.  Denies SI/HI/hallucinations at this time.  A.  Support and encouragement offered.  R.  Pt remains safe, will continue to monitor.

## 2014-01-07 NOTE — Progress Notes (Signed)
Patient ID: Kevin [name], male   DOB: 04-12-1946, 68 y.o.   MRN: [id number] [facility] MD Progress Note  01/07/2014 2:51 PM Kevin [name]  MRN:  [id number] Subjective:  Patient states he is feeling better "slowly but surely".  The patient did not start drinking until about two years ago on a regular basis.  He feels it has taking a lot away from him, health wise.  [name] does not take insulin at home and does not want it here.  He is currently on Avapro and Metformin so order discontinued for now per his request. Diagnosis:   DSM5:  Substance/Addictive Disorders:  Alcohol Related Disorder - Severe (303.90), Alcohol Intoxication with Use Disorder - Severe (F10.229), Alcohol Withdrawal (291.81) and Opioid Disorder - Severe (304.00) Depressive Disorders:  Major Depressive Disorder - Severe (296.23) Total Time spent with patient: 20 minutes  Axis I: Alcohol Abuse and Major Depression, Recurrent severe; Substance abuse Axis II: Deferred Axis III:  Past Medical History  Diagnosis Date  . Arthritis   . Gout   . Alcohol abuse   . Hypertension   . Hyperthyroidism   . Diabetes mellitus without complication   . GERD (gastroesophageal reflux disease)    Axis IV: other psychosocial or environmental problems, problems related to social environment and problems with primary support group Axis V: 41-50 serious symptoms  ADL's:  Intact  Sleep: Good  Appetite:  Fair  Suicidal Ideation:  Denies  Homicidal Ideation:  Denies   Psychiatric Specialty Exam: Physical Exam  Constitutional: He is oriented to person, place, and time. He appears well-developed and well-nourished.  HENT:  Head: Normocephalic and atraumatic.  Neck: Normal range of motion.  Respiratory: Effort normal.  GI: Soft.  Musculoskeletal: Normal range of motion.  Neurological: He is alert and oriented to person, place, and time.  Skin: Skin is warm and dry.    Review of Systems  Constitutional: Positive for malaise/fatigue.   HENT: Negative.   Eyes: Negative.   Respiratory: Negative.   Cardiovascular: Negative.   Gastrointestinal: Negative.   Genitourinary: Negative.   Musculoskeletal: Negative.   Skin: Negative.   Neurological: Negative.   Endo/Heme/Allergies: Negative.   Psychiatric/Behavioral: Positive for depression and substance abuse. The patient is nervous/anxious.     Blood pressure 96/64, pulse 69, temperature 98.6 F (37 C), temperature source Oral, resp. rate 20, height 5' 9.5" (1.765 m), weight 228 lb (103.42 kg).Body mass index is 33.2 kg/(m^2).  General Appearance: Casual  Eye Contact::  Minimal  Speech:  Slow  Volume:  Decreased  Mood:  Depressed  Affect:  Congruent  Thought Process:  Coherent  Orientation:  Full (Time, Place, and Person)  Thought Content:  WDL  Suicidal Thoughts:  No  Homicidal Thoughts:  No  Memory:  Immediate;   Fair Recent;   Fair Remote;   Fair  Judgement:  Poor  Insight:  Fair  Psychomotor Activity:  Decreased  Concentration:  Fair  Recall:  [employer] of Knowledge:Fair  Language: Fair  Akathisia:  No  Handed:  Right  AIMS (if indicated):     Assets:  Resilience  Sleep:  Number of Hours: 5   Musculoskeletal: Strength & Muscle Tone: within normal limits Gait & Station: normal Patient leans: N/A  Current Medications: Current Facility-Administered Medications  Medication Dose Route Frequency Provider Last Rate Last Dose  . acetaminophen (TYLENOL) tablet 650 mg  650 mg Oral Q6H PRN Laverle Hobby, PA-C      . alum & mag hydroxide-simeth (MAALOX/MYLANTA)  200-200-20 MG/5ML suspension 30 mL  30 mL Oral Q4H PRN Laverle Hobby, PA-C      . amLODipine (NORVASC) tablet 10 mg  10 mg Oral Daily Nicholaus Bloom, MD   10 mg at 01/07/14 0758  . [START ON 01/08/2014] chlordiazePOXIDE (LIBRIUM) capsule 25 mg  25 mg Oral Daily Spencer E Simon, PA-C      . citalopram (CELEXA) tablet 20 mg  20 mg Oral Daily Encarnacion Slates, NP   20 mg at 01/07/14 0758  . cloNIDine  (CATAPRES) tablet 0.1 mg  0.1 mg Oral BH-qamhs Spencer E Simon, PA-C   0.1 mg at 01/07/14 0800   Followed by  . [START ON 01/09/2014] cloNIDine (CATAPRES) tablet 0.1 mg  0.1 mg Oral QAC breakfast Laverle Hobby, PA-C      . colchicine tablet 0.6 mg  0.6 mg Oral Daily Laverle Hobby, PA-C   0.6 mg at 01/07/14 0758  . dicyclomine (BENTYL) tablet 20 mg  20 mg Oral Q6H PRN Laverle Hobby, PA-C      . feeding supplement (GLUCERNA SHAKE) (GLUCERNA SHAKE) liquid 237 mL  237 mL Oral BID BM Christie Beckers, RD   237 mL at 01/07/14 1344  . gabapentin (NEURONTIN) capsule 100 mg  100 mg Oral TID Laverle Hobby, PA-C   100 mg at 01/07/14 1147  . hydrochlorothiazide (MICROZIDE) capsule 12.5 mg  12.5 mg Oral Daily Nicholaus Bloom, MD   12.5 mg at 01/07/14 0758  . irbesartan (AVAPRO) tablet 300 mg  300 mg Oral Daily Nicholaus Bloom, MD   300 mg at 01/07/14 0758  . levothyroxine (SYNTHROID, LEVOTHROID) tablet 100 mcg  100 mcg Oral Daily Laverle Hobby, PA-C   100 mcg at 01/07/14 0758  . magnesium hydroxide (MILK OF MAGNESIA) suspension 30 mL  30 mL Oral Daily PRN Laverle Hobby, PA-C      . metFORMIN (GLUCOPHAGE-XR) 24 hr tablet 1,000 mg  1,000 mg Oral Q24H Nicholaus Bloom, MD      . methocarbamol (ROBAXIN) tablet 500 mg  500 mg Oral Q8H PRN Laverle Hobby, PA-C      . multivitamin with minerals tablet 1 tablet  1 tablet Oral Daily Laverle Hobby, PA-C   1 tablet at 01/07/14 0758  . pantoprazole (PROTONIX) EC tablet 20 mg  20 mg Oral Daily Laverle Hobby, PA-C   20 mg at 01/07/14 0759  . thiamine (B-1) injection 100 mg  100 mg Intramuscular Once [employer], PA-C      . thiamine (VITAMIN B-1) tablet 100 mg  100 mg Oral Daily Laverle Hobby, PA-C   100 mg at 01/07/14 0758  . traZODone (DESYREL) tablet 50 mg  50 mg Oral QHS,MR X 1 Laverle Hobby, PA-C   50 mg at 01/06/14 2130    Lab Results:  Results for orders placed during the hospital encounter of 01/03/14 (from the past 48 hour(s))  GLUCOSE, CAPILLARY      Status: Abnormal   Collection Time    01/05/14  5:01 PM      Result Value Range   Glucose-Capillary 124 (*) 70 - 99 mg/dL  GLUCOSE, CAPILLARY     Status: Abnormal   Collection Time    01/05/14  9:15 PM      Result Value Range   Glucose-Capillary 120 (*) 70 - 99 mg/dL  GLUCOSE, CAPILLARY     Status: Abnormal   Collection Time  01/06/14  6:05 AM      Result Value Range   Glucose-Capillary 137 (*) 70 - 99 mg/dL  GLUCOSE, CAPILLARY     Status: Abnormal   Collection Time    01/06/14 11:23 AM      Result Value Range   Glucose-Capillary 133 (*) 70 - 99 mg/dL  GLUCOSE, CAPILLARY     Status: Abnormal   Collection Time    01/06/14  5:05 PM      Result Value Range   Glucose-Capillary 122 (*) 70 - 99 mg/dL  GLUCOSE, CAPILLARY     Status: Abnormal   Collection Time    01/06/14  9:22 PM      Result Value Range   Glucose-Capillary 110 (*) 70 - 99 mg/dL   Comment 1 Notify RN     Comment 2 Documented in Chart    GLUCOSE, CAPILLARY     Status: Abnormal   Collection Time    01/07/14  5:59 AM      Result Value Range   Glucose-Capillary 119 (*) 70 - 99 mg/dL   Comment 1 Notify RN     Comment 2 Documented in Chart    GLUCOSE, CAPILLARY     Status: Abnormal   Collection Time    01/07/14 11:46 AM      Result Value Range   Glucose-Capillary 101 (*) 70 - 99 mg/dL    Physical Findings: AIMS: Facial and Oral Movements Muscles of Facial Expression: None, normal Lips and Perioral Area: None, normal Jaw: None, normal Tongue: None, normal,Extremity Movements Upper (arms, wrists, hands, fingers): None, normal Lower (legs, knees, ankles, toes): None, normal, Trunk Movements Neck, shoulders, hips: None, normal, Overall Severity Severity of abnormal movements (highest score from questions above): None, normal Incapacitation due to abnormal movements: None, normal Patient's awareness of abnormal movements (rate only patient's report): No Awareness, Dental Status Current problems with teeth  and/or dentures?: No Does patient usually wear dentures?: No  CIWA:  CIWA-Ar Total: 0 COWS:     Treatment Plan Summary: Daily contact with patient to assess and evaluate symptoms and progress in treatment Medication management  Plan:  Review of chart, vital signs, medications, and notes. 1-Individual and group therapy 2-Medication management for depression and anxiety:  Medications reviewed with the patient and he requested insulin be discontinued and it was--not on it at home and will not take it after discharge Celexa 20 mg daily for depression Librium and Clonidine detox protocol for alcohol and opiate detox Insulin discontinued due to patient's request not to have it, not on it at home 3-Coping skills for depression, anxiety, and substance dependency 4-Continue crisis stabilization and management 5-Address health issues--monitoring vital signs, stable 6-Treatment plan in progress to prevent relapse of depression, substance use, and anxiety  Medical Decision Making Problem Points:  Established problem, stable/improving (1) and Review of psycho-social stressors (1) Data Points:  Review of medication regiment & side effects (2)  I certify that inpatient services furnished can reasonably be expected to improve the patient's condition.   Waylan Boga, [place] 01/07/2014, 2:51 PM  I agreed with the findings, treatment and disposition plan of this patient. Berniece Andreas, MD

## 2014-01-07 NOTE — [facility] Group Notes (Signed)
[facility] Group Notes:  (Clinical Social Work)  01/07/2014  10:00-11:00AM  Summary of Progress/Problems:   The main focus of today's process group was to   identify the patient's current support system and decide on other supports that can be put in place.  The picture on workbook was used to discuss why additional supports are needed.  An emphasis was placed on using counselor, doctor, therapy groups, 12-step groups, and problem-specific support groups to expand supports.   There was also an extensive discussion about what constitutes a healthy support versus an unhealthy support.  The patient expressed full comprehension of the concepts presented, and agreed that there is a need to add more supports.  The patient stated the current supports in place are 3 lifelong friends who have never drank and want him to not drink, have been visiting here.  The unhealthy supports are old friends who want to drink with him, and he shared with the group exactly how he is going to handle that.  The supports that are to be added are a psychiatrist.  Type of Therapy:  Process Group with Motivational Interviewing  Participation Level:  Active  Participation Quality:  Attentive, Sharing and Supportive  Affect:  Blunted and Depressed  Cognitive:  Oriented  Insight:  Engaged  Engagement in Therapy:  Engaged  Modes of Intervention:   Education, Support and Processing, Activity  [employer], LCSW 01/07/2014, 12:15pm

## 2014-01-07 NOTE — [facility] Group Notes (Signed)
[facility] Group Notes:  (Nursing/MHT/Case Management/Adjunct)  Date:  01/07/2014  Time:  9:43 AM  Type of Therapy:  Psychoeducational Skills  Participation Level:  Active  Participation Quality:  Appropriate and Attentive  Affect:  Appropriate  Cognitive:  Alert, Appropriate and Oriented  Insight:  Appropriate and Good  Engagement in Group:  Engaged  Modes of Intervention:  Discussion  Summary of Progress/Problems: Pt states he is planning on attending AA meetings after discharge and would like to attend outpatient therapy as well. Pt states he has a very supportive family.  Romie Minus 01/07/2014, 9:43 AM

## 2014-01-07 NOTE — Progress Notes (Signed)
[facility] Group Notes:  (Nursing/MHT/Case Management/Adjunct)  Date:  01/07/2014  Time:  6:46 PM  Type of Therapy:  Psychoeducational Skills  Participation Level:  Minimal  Participation Quality:  Resistant  Affect:  Flat  Cognitive:  Appropriate  Insight:  Lacking  Engagement in Group:  Poor  Modes of Intervention:  Activity  Summary of Progress/Problems: Pts played an activity using the Therapeutic Ball. At the end of group, pts were asked one healthy support they currently have or can add to their support system to go with the theme for the day. Pt initially attended group and was participating, however pt ended up leaving group early.  Clint Bolder 01/07/2014, 6:46 PM

## 2014-01-07 NOTE — Progress Notes (Signed)
Patient ID: Kevin [name], male   DOB: 01-19-1946, 68 y.o.   MRN: [id number]  D: Patient with dull, flat affect endorsing depression but denies SI or plans to harm himself. A: Q 15 minute safety checks, encourage staff/peer interaction and group participation. Administer medications as ordered by MD. R: Patient attended group sessions, and was compliant with medications. No inappropriate behaviors noted.

## 2014-01-07 NOTE — Progress Notes (Signed)
[facility] Group Notes:  (Nursing/MHT/Case Management/Adjunct)  Date:  01/07/2014  Time:  9:08 AM  Type of Therapy:  Psychoeducational Skills  Participation Level:  Active  Participation Quality:  Appropriate and Drowsy  Affect:  Appropriate  Cognitive:  Appropriate  Insight:  Appropriate  Engagement in Group:  Developing/Improving  Modes of Intervention:  Activity and Socialization  Summary of Progress/Problems: Pts played an activity called "Unqiue Qualities". Pts wrote down 5 unique qualities about themselves and then had to guess who those qualities belonged to. Pts were able to share things about themselves that they don't normally share with others. Pt was engaged and participated.  Kevin [name] 01/07/2014, 9:08 AM

## 2014-01-08 LAB — COMPREHENSIVE METABOLIC PANEL
ALT: 63 U/L — AB (ref 0–53)
AST: 83 U/L — AB (ref 0–37)
Albumin: 3.3 g/dL — ABNORMAL LOW (ref 3.5–5.2)
Alkaline Phosphatase: 48 U/L (ref 39–117)
BUN: 25 mg/dL — ABNORMAL HIGH (ref 6–23)
CHLORIDE: 101 meq/L (ref 96–112)
CO2: 27 meq/L (ref 19–32)
Calcium: 8.8 mg/dL (ref 8.4–10.5)
Creatinine, Ser: 1.47 mg/dL — ABNORMAL HIGH (ref 0.50–1.35)
GFR calc Af Amer: 56 mL/min — ABNORMAL LOW (ref >90)
GFR, EST NON AFRICAN AMERICAN: 48 mL/min — AB (ref >90)
GLUCOSE: 135 mg/dL — AB (ref 70–99)
Potassium: 4.2 mEq/L (ref 3.7–5.3)
SODIUM: 139 meq/L (ref 137–147)
Total Bilirubin: 0.3 mg/dL (ref 0.3–1.2)
Total Protein: 6.7 g/dL (ref 6.0–8.3)

## 2014-01-08 LAB — GLUCOSE, CAPILLARY
Glucose-Capillary: 120 mg/dL — ABNORMAL HIGH (ref 70–99)
Glucose-Capillary: 110 mg/dL — ABNORMAL HIGH (ref 70–99)
Glucose-Capillary: 121 mg/dL — ABNORMAL HIGH (ref 70–99)

## 2014-01-08 MED ORDER — AMLODIPINE BESYLATE 5 MG PO TABS
5.0000 mg | ORAL_TABLET | Freq: Every day | ORAL | Status: DC
  Administered 2014-01-09 – 2014-01-17 (×9): 5 mg via ORAL
  Filled 2014-01-08 (×11): qty 1

## 2014-01-08 NOTE — Progress Notes (Signed)
[facility] MD Progress Note  01/08/2014 5:10 PM Kevin [name]  MRN:  [id number] Subjective:  Kevin [name] states that he still feels weak. States he has not recovered as well as he used to do before. He expresses that his legs are weak, he gets easily tired, fatigue. His BP readings have bee on the low side. He understands that his body is going to take some time to recover after all that he has been drinking but says this is different. Diagnosis:   DSM5: Schizophrenia Disorders:  none Obsessive-Compulsive Disorders:  none Trauma-Stressor Disorders:  none Substance/Addictive Disorders:  Alcohol Related Disorder - Severe (303.90) Depressive Disorders:  Major Depressive Disorder - Moderate (296.22) Total Time spent with patient: 30 minutes  Axis I: Generalized Anxiety Disorder and Substance Induced Mood Disorder  ADL's:  Intact  Sleep: Fair  Appetite:  Fair  Suicidal Ideation:  Plan:  denies Intent:  denies Means:  denies Homicidal Ideation:  Plan:  denies Intent:  denies Means:  denies AEB (as evidenced by):  Psychiatric Specialty Exam: Physical Exam  Review of Systems  Constitutional: Positive for malaise/fatigue.  HENT: Negative.   Eyes: Negative.   Respiratory: Negative.   Cardiovascular: Negative.   Gastrointestinal: Negative.   Genitourinary: Negative.   Skin: Negative.   Neurological: Positive for dizziness and weakness.  Endo/Heme/Allergies: Negative.   Psychiatric/Behavioral: Positive for substance abuse. The patient is nervous/anxious.     Blood pressure 117/73, pulse 65, temperature 98.4 F (36.9 C), temperature source Oral, resp. rate 24, height 5' 9.5" (1.765 m), weight 103.42 kg (228 lb).Body mass index is 33.2 kg/(m^2).  General Appearance: Fairly Groomed  Engineer, water::  Fair  Speech:  Clear and Coherent and Slow  Volume:  Decreased  Mood:  weak, tired, worried  Affect:  Restricted  Thought Process:  Coherent and Goal Directed  Orientation:  Full (Time, Place,  and Person)  Thought Content:  symptoms, worries, concerns  Suicidal Thoughts:  No  Homicidal Thoughts:  No  Memory:  Immediate;   Fair Recent;   Fair Remote;   Fair  Judgement:  Fair  Insight:  Present  Psychomotor Activity:  Restlessness  Concentration:  Fair  Recall:  [employer] of Knowledge:Fair  Language: Fair  Akathisia:  No  Handed:    AIMS (if indicated):     Assets:  Desire for Improvement  Sleep:  Number of Hours: 4.75   Musculoskeletal: Strength & Muscle Tone: within normal limits Gait & Station: normal Patient leans: N/A  Current Medications: Current Facility-Administered Medications  Medication Dose Route Frequency Provider Last Rate Last Dose  . acetaminophen (TYLENOL) tablet 650 mg  650 mg Oral Q6H PRN Laverle Hobby, PA-C      . alum & mag hydroxide-simeth (MAALOX/MYLANTA) 200-200-20 MG/5ML suspension 30 mL  30 mL Oral Q4H PRN Laverle Hobby, PA-C      . [START ON 01/09/2014] amLODipine (NORVASC) tablet 5 mg  5 mg Oral Daily Nicholaus Bloom, MD      . citalopram (CELEXA) tablet 20 mg  20 mg Oral Daily Encarnacion Slates, NP   20 mg at 01/08/14 0825  . colchicine tablet 0.6 mg  0.6 mg Oral Daily Laverle Hobby, PA-C   0.6 mg at 01/08/14 6222  . dicyclomine (BENTYL) tablet 20 mg  20 mg Oral Q6H PRN Laverle Hobby, PA-C      . feeding supplement (GLUCERNA SHAKE) (GLUCERNA SHAKE) liquid 237 mL  237 mL Oral BID BM Christie Beckers, RD  237 mL at 01/08/14 1409  . gabapentin (NEURONTIN) capsule 100 mg  100 mg Oral TID Laverle Hobby, PA-C   100 mg at 01/08/14 1703  . hydrochlorothiazide (MICROZIDE) capsule 12.5 mg  12.5 mg Oral Daily Nicholaus Bloom, MD   12.5 mg at 01/08/14 [id number]  . irbesartan (AVAPRO) tablet 300 mg  300 mg Oral Daily Nicholaus Bloom, MD   300 mg at 01/08/14 [id number]  . levothyroxine (SYNTHROID, LEVOTHROID) tablet 100 mcg  100 mcg Oral Daily Laverle Hobby, PA-C   100 mcg at 01/08/14 [id number]  . magnesium hydroxide (MILK OF MAGNESIA) suspension 30 mL  30 mL Oral  Daily PRN Laverle Hobby, PA-C      . metFORMIN (GLUCOPHAGE-XR) 24 hr tablet 1,000 mg  1,000 mg Oral Q24H Nicholaus Bloom, MD   1,000 mg at 01/07/14 2128  . methocarbamol (ROBAXIN) tablet 500 mg  500 mg Oral Q8H PRN Laverle Hobby, PA-C      . multivitamin with minerals tablet 1 tablet  1 tablet Oral Daily Laverle Hobby, PA-C   1 tablet at 01/08/14 [id number]  . pantoprazole (PROTONIX) EC tablet 20 mg  20 mg Oral Daily Laverle Hobby, PA-C   20 mg at 01/08/14 [id number]  . thiamine (B-1) injection 100 mg  100 mg Intramuscular Once [employer], PA-C      . thiamine (VITAMIN B-1) tablet 100 mg  100 mg Oral Daily Laverle Hobby, PA-C   100 mg at 01/08/14 [id number]  . traZODone (DESYREL) tablet 50 mg  50 mg Oral QHS,MR X 1 Laverle Hobby, PA-C   50 mg at 01/07/14 2128    Lab Results:  Results for orders placed during the hospital encounter of 01/03/14 (from the past 48 hour(s))  GLUCOSE, CAPILLARY     Status: Abnormal   Collection Time    01/06/14  9:22 PM      Result Value Range   Glucose-Capillary 110 (*) 70 - 99 mg/dL   Comment 1 Notify RN     Comment 2 Documented in Chart    GLUCOSE, CAPILLARY     Status: Abnormal   Collection Time    01/07/14  5:59 AM      Result Value Range   Glucose-Capillary 119 (*) 70 - 99 mg/dL   Comment 1 Notify RN     Comment 2 Documented in Chart    GLUCOSE, CAPILLARY     Status: Abnormal   Collection Time    01/07/14 11:46 AM      Result Value Range   Glucose-Capillary 101 (*) 70 - 99 mg/dL  GLUCOSE, CAPILLARY     Status: Abnormal   Collection Time    01/07/14  5:14 PM      Result Value Range   Glucose-Capillary 122 (*) 70 - 99 mg/dL   Comment 1 Notify RN    GLUCOSE, CAPILLARY     Status: Abnormal   Collection Time    01/07/14  9:24 PM      Result Value Range   Glucose-Capillary 117 (*) 70 - 99 mg/dL   Comment 1 Notify RN     Comment 2 Documented in Chart    GLUCOSE, CAPILLARY     Status: Abnormal   Collection Time    01/08/14  6:33 AM      Result  Value Range   Glucose-Capillary 120 (*) 70 - 99 mg/dL   Comment 1 Notify RN     Comment 2  Documented in Chart    GLUCOSE, CAPILLARY     Status: Abnormal   Collection Time    01/08/14  4:55 PM      Result Value Range   Glucose-Capillary 110 (*) 70 - 99 mg/dL    Physical Findings: AIMS: Facial and Oral Movements Muscles of Facial Expression: None, normal Lips and Perioral Area: None, normal Jaw: None, normal Tongue: None, normal,Extremity Movements Upper (arms, wrists, hands, fingers): None, normal Lower (legs, knees, ankles, toes): None, normal, Trunk Movements Neck, shoulders, hips: None, normal, Overall Severity Severity of abnormal movements (highest score from questions above): None, normal Incapacitation due to abnormal movements: None, normal Patient's awareness of abnormal movements (rate only patient's report): No Awareness, Dental Status Current problems with teeth and/or dentures?: No Does patient usually wear dentures?: No  CIWA:  CIWA-Ar Total: 0 COWS:     Treatment Plan Summary: Daily contact with patient to assess and evaluate symptoms and progress in treatment Medication management  Plan: Supportive approach/coping skills/relpse prevention           Reassess BP medications           Check electrolytes  Medical Decision Making Problem Points:  Review of psycho-social stressors (1) Data Points:  Review of medication regiment & side effects (2) Review of new medications or change in dosage (2)  I certify that inpatient services furnished can reasonably be expected to improve the patient's condition.   Lelia Jons A 01/08/2014, 5:10 PM

## 2014-01-08 NOTE — BHH Group Notes (Signed)
[place] LCSW Group Therapy  01/08/2014 2:44 PM  Type of Therapy:  Group Therapy  Participation Level:  Active  Participation Quality:  Attentive  Affect:  Appropriate  Cognitive:  Alert and Oriented  Insight:  Engaged  Engagement in Therapy:  Improving  Modes of Intervention:  Confrontation, Discussion, Education, Exploration, Limit-setting, Problem-solving, Rapport Building, Socialization and Support  Summary of Progress/Problems: Today's Topic: Overcoming Obstacles. Pt identified obstacles faced currently and processed barriers involved in overcoming these obstacles. Pt identified steps necessary for overcoming these obstacles and explored motivation (internal and external) for facing these difficulties head on. Pt further identified one area of concern in their lives and chose a skill of focus pulled from their "toolbox." Julius was attentive and engaged throughout today's therapy group. He shared that his biggest current obstacle involved his physical health. "I feel weak all the time. It isn't getting any better and I'm hoping it won't always be like this for me." Cristobal Goldmann and other pts discussed how he can keep himself safe while on the unit (fall risk band, ask for help getting up and around, ask RN for assistance if needed, talk to doctor about weakness). Demani demonstrates progress in the group setting and improving insight AEB his ability to process how advocating for himself and reaching out for help can benefit him in terms of maintaining safety and sobriety. Kashten identified his [place] sponsor as a positive support and stated that he plans to follow up with [facility] in addition to [facility] for med management and groups.    Smart, Asar Evilsizer LCSWA  01/08/2014, 2:44 PM

## 2014-01-08 NOTE — Progress Notes (Signed)
Adult Psychoeducational Group Note  Date:  01/08/2014 Time:  9:21 PM  Group Topic/Focus:  Wrap-Up Group:   The focus of this group is to help patients review their daily goal of treatment and discuss progress on daily workbooks.  Participation Level:  Active  Participation Quality:  Attentive  Affect:  Appropriate  Cognitive:  Appropriate  Insight: Good  Engagement in Group:  Engaged  Modes of Intervention:  Discussion  Additional Comments:  Patient stated that he had a So So Day and that he was having some dizzy spells. Patient says that the thing that he does to take care of himself is to drink plenty of water.  Donney Rankins 01/08/2014, 9:21 PM

## 2014-01-08 NOTE — BHH Group Notes (Signed)
[facility] LCSW Aftercare Discharge Planning Group Note   01/08/2014 9:32 AM  Participation Quality:  Minimal   Mood/Affect:  Appropriate  Depression Rating:  7  Anxiety Rating:  7  Thoughts of Suicide:  No Will you contract for safety?   NA  Current AVH:  No  Plan for Discharge/Comments:  Pt plans to return home and follow up at [facility] for med management. Pt refusing therapy and does not want to go to [facility].   Transportation Means: family member  Supports: some family supports   Proofreader, Research officer, trade union

## 2014-01-08 NOTE — Progress Notes (Addendum)
Patient ID: Kevin [name], male   DOB: February 06, 1946, 68 y.o.   MRN: [id number] He has be up and to groups interacting with peers and staff. Self inventory: depression and hopelessness at 7. Withdrawal symptoms agitation, denies SI thoughts, and pain light head ness.  After he tooke his clonidine this AM he was c/o being more dizzy after lunch. NP informed. See new orders.

## 2014-01-08 NOTE — Progress Notes (Signed)
Recreation Therapy Notes  Date: 02.09.2015 Time: 2:45pm Location: [address] Dayroom   Group Topic: Wellness  Goal Area(s) Addresses:  Patient will identify dimension of wellness they most struggle with.  Patient will identify at least 3 ways to invest in that type of wellness.  Patient will identify benefit of identifying areas of improvement.   Behavioral Response: Did not attend.   Laureen Ochs Carollyn Etcheverry, LRT/CTRS  Lane Hacker 01/08/2014 5:35 PM

## 2014-01-08 NOTE — Progress Notes (Signed)
Patient ID: Kevin [name], male   DOB: 03/20/1946, 68 y.o.   MRN: [id number] D: Pt. Visible on unit, interacting in the dayroom. Pt. Reports "a little off balance, but not a much as I was" Pt. Proceeds to tell how his BP has been low and "It has never been low, they always tell me when I go for my check ups, everything checks out all right" pt. 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 Writer noted non-pitting, edema in upper extremities,(hands) and lower extremities (feet). Pt. Encouraged to elevate. Staff will monitor q31min for safety. R: Pt. Is safe on the unit. Pt. Agrees to call staff if dizzy. Staff will put client to the front of the line for breakfast due to unsteadiness and diabetes.

## 2014-01-08 NOTE — Progress Notes (Signed)
Patient ID: Kevin [name], male   DOB: Mar 04, 1946, 68 y.o.   MRN: [id number] PER STATE REGULATIONS 482.30  THIS CHART WAS REVIEWED FOR MEDICAL NECESSITY WITH RESPECT TO THE PATIENT'S ADMISSION/ DURATION OF STAY.  NEXT REVIEW DATE: 01/11/2014  Chauncy Lean, RN, BSN CASE MANAGER

## 2014-01-09 DIAGNOSIS — F1994 Other psychoactive substance use, unspecified with psychoactive substance-induced mood disorder: Secondary | ICD-10-CM

## 2014-01-09 DIAGNOSIS — F321 Major depressive disorder, single episode, moderate: Secondary | ICD-10-CM

## 2014-01-09 LAB — GLUCOSE, CAPILLARY
GLUCOSE-CAPILLARY: 97 mg/dL (ref 70–99)
Glucose-Capillary: 104 mg/dL — ABNORMAL HIGH (ref 70–99)

## 2014-01-09 MED ORDER — HYDROXYZINE HCL 25 MG PO TABS
25.0000 mg | ORAL_TABLET | Freq: Three times a day (TID) | ORAL | Status: DC
  Administered 2014-01-09 – 2014-01-17 (×24): 25 mg via ORAL
  Filled 2014-01-09 (×2): qty 1
  Filled 2014-01-09: qty 12
  Filled 2014-01-09 (×20): qty 1
  Filled 2014-01-09: qty 12
  Filled 2014-01-09 (×6): qty 1
  Filled 2014-01-09: qty 12
  Filled 2014-01-09: qty 1

## 2014-01-09 NOTE — Progress Notes (Signed)
Recreation Therapy Notes  Animal-Assisted Activity/Therapy (AAA/T) Program Checklist/Progress Notes Patient Eligibility Criteria Checklist & Daily Group note for Rec Tx Intervention  Date: 02.10.2015 Time: 2:45pm Location: [address]    AAA/T Program Assumption of Risk Form signed by Patient/ or Parent Legal Guardian yes  Patient is free of allergies or sever asthma yes  Patient reports no fear of animals yes  Patient reports no history of cruelty to animals yes   Patient understands his/her participation is voluntary yes  Behavioral Response: Did not attend  [employer], LRT/CTRS  Lane Hacker 01/09/2014 4:26 PM

## 2014-01-09 NOTE — Progress Notes (Signed)
Patient ID: Kevin [name], male   DOB: 30-Dec-1945, 68 y.o.   MRN: [id number] He has been up and has been brighter today says that he feels less tired/dizzy today after not taking the clonidine. Self inventory: depression 6, hopelessness 5, withdrawals slight agitation, denies SI thoughts.

## 2014-01-09 NOTE — BHH Group Notes (Signed)
[place] Group Notes:  (Nursing/MHT/Case Management/Adjunct)  Date:  01/09/2014  Time:  9:31 AM  Type of Therapy:  Nurse Education  Participation Level:  Active  Participation Quality:  Appropriate and Attentive  Affect:  Appropriate  Cognitive:  Alert, Appropriate and Oriented  Insight:  Appropriate and Good  Engagement in Group:  Engaged  Modes of Intervention:  Discussion and Education  Summary of Progress/Problems: Group explored different ways to improve sleep health through exercise, nutrition and routines. Pt states he has been sleeping well and doesn't require a sleep aid. Romie Minus 01/09/2014, 9:31 AM

## 2014-01-09 NOTE — BHH Group Notes (Signed)
[place] LCSW Group Therapy  01/09/2014 3:12 PM  Type of Therapy:  Group Therapy  Participation Level:  Active  Participation Quality:  Attentive  Affect:  Appropriate  Cognitive:  Alert and Oriented  Insight:  Engaged  Engagement in Therapy:  Engaged  Modes of Intervention:  Discussion, Education, Rapport Building, Socialization and Support  Summary of Progress/Problems: [facility] Speaker came to talk about his personal journey with substance abuse and addiction. The pt processed ways by which to relate to the speaker. [place] speaker provided handouts and educational information pertaining to groups and services offered by the [facility]. Kevin [name] was attentive and engaged throughout today's therapy group. He actively listened as the speaker shared his personal story with the group. Kevin [name] thanked the speaker for sharing his testimony and remained attentive throughout today's group.    Smart, Detria Cummings LCSWA  01/09/2014, 3:12 PM

## 2014-01-09 NOTE — Progress Notes (Signed)
Patient ID: Kevin [name], male   DOB: 1946-01-29, 68 y.o.   MRN: [id number] [facility] MD Progress Note  01/09/2014 1:26 PM [name]  MRN:  [id number]  Subjective:  [name] states that he is feeling a lot better today since his medicines were adjusted yesterday. He says that he can tell the difference in his ability to stand and move around. He says his body is still weak. He adds that he is still depressed and anxious. [name] says he gets highly anxious and panic attacks a lot, and that was part of the reason he drank so much to help the anxious and depressed feelings. He concludes by saying, "I still feel tremulous and depressed inside".   Diagnosis:   DSM5: Schizophrenia Disorders:  none Obsessive-Compulsive Disorders:  none Trauma-Stressor Disorders:  none Substance/Addictive Disorders:  Alcohol Related Disorder - Severe (303.90) Depressive Disorders:  Major Depressive Disorder - Moderate (296.22) Total Time spent with patient: 30 minutes  Axis I: Generalized Anxiety Disorder and Substance Induced Mood Disorder  ADL's:  Intact  Sleep: Fair  Appetite:  Fair  Suicidal Ideation:  Plan:  denies Intent:  denies Means:  denies  Homicidal Ideation:  Plan:  denies Intent:  denies Means:  denies AEB (as evidenced by):  Psychiatric Specialty Exam: Physical Exam  Review of Systems  Constitutional: Positive for malaise/fatigue.  HENT: Negative.   Eyes: Negative.   Respiratory: Negative.   Cardiovascular: Negative.   Gastrointestinal: Negative.   Genitourinary: Negative.   Skin: Negative.   Neurological: Positive for dizziness and weakness.  Endo/Heme/Allergies: Negative.   Psychiatric/Behavioral: Positive for depression (currently on medication for stabilization.) and substance abuse. Negative for suicidal ideas and memory loss. The patient is nervous/anxious and has insomnia.     Blood pressure 105/72, pulse 65, temperature 97.3 F (36.3 C), temperature source Oral, resp.  rate 24, height 5' 9.5" (1.765 m), weight 103.42 kg (228 lb).Body mass index is 33.2 kg/(m^2).  General Appearance: Fairly Groomed  Engineer, water::  Fair  Speech:  Clear and Coherent and Slow  Volume:  Decreased  Mood:  weak, tired, worried  Affect:  Restricted  Thought Process:  Coherent and Goal Directed  Orientation:  Full (Time, Place, and Person)  Thought Content:  symptoms, worries, concerns  Suicidal Thoughts:  No  Homicidal Thoughts:  No  Memory:  Immediate;   Fair Recent;   Fair Remote;   Fair  Judgement:  Fair  Insight:  Present  Psychomotor Activity:  Restlessness  Concentration:  Fair  Recall:  [employer] of [place]: Fair  Akathisia:  No  Handed:    AIMS (if indicated):     Assets:  Desire for Improvement  Sleep:  Number of Hours: 6.5   Musculoskeletal: Strength & Muscle Tone: within normal limits Gait & Station: normal Patient leans: N/A  Current Medications: Current Facility-Administered Medications  Medication Dose Route Frequency Provider Last Rate Last Dose  . acetaminophen (TYLENOL) tablet 650 mg  650 mg Oral Q6H PRN Laverle Hobby, PA-C      . alum & mag hydroxide-simeth (MAALOX/MYLANTA) 200-200-20 MG/5ML suspension 30 mL  30 mL Oral Q4H PRN Laverle Hobby, PA-C      . amLODipine (NORVASC) tablet 5 mg  5 mg Oral Daily Nicholaus Bloom, MD   5 mg at 01/09/14 [phone]  . citalopram (CELEXA) tablet 20 mg  20 mg Oral Daily Encarnacion Slates, NP   20 mg at 01/09/14 0841  . colchicine tablet 0.6 mg  0.6 mg Oral Daily Laverle Hobby, PA-C   0.6 mg at 01/09/14 0845  . feeding supplement (GLUCERNA SHAKE) (GLUCERNA SHAKE) liquid 237 mL  237 mL Oral BID BM Christie Beckers, RD   237 mL at 01/09/14 0841  . gabapentin (NEURONTIN) capsule 100 mg  100 mg Oral TID Laverle Hobby, PA-C   100 mg at 01/09/14 1201  . hydrochlorothiazide (MICROZIDE) capsule 12.5 mg  12.5 mg Oral Daily Nicholaus Bloom, MD   12.5 mg at 01/09/14 0845  . irbesartan (AVAPRO) tablet 300 mg   300 mg Oral Daily Nicholaus Bloom, MD   300 mg at 01/09/14 0841  . levothyroxine (SYNTHROID, LEVOTHROID) tablet 100 mcg  100 mcg Oral Daily Laverle Hobby, PA-C   100 mcg at 01/09/14 8127  . magnesium hydroxide (MILK OF MAGNESIA) suspension 30 mL  30 mL Oral Daily PRN Laverle Hobby, PA-C      . metFORMIN (GLUCOPHAGE-XR) 24 hr tablet 1,000 mg  1,000 mg Oral Q24H Nicholaus Bloom, MD   1,000 mg at 01/08/14 2143  . multivitamin with minerals tablet 1 tablet  1 tablet Oral Daily Laverle Hobby, PA-C   1 tablet at 01/09/14 0845  . pantoprazole (PROTONIX) EC tablet 20 mg  20 mg Oral Daily Laverle Hobby, PA-C   20 mg at 01/09/14 0841  . thiamine (B-1) injection 100 mg  100 mg Intramuscular Once [employer], PA-C      . thiamine (VITAMIN B-1) tablet 100 mg  100 mg Oral Daily Laverle Hobby, PA-C   100 mg at 01/09/14 0841  . traZODone (DESYREL) tablet 50 mg  50 mg Oral QHS,MR X 1 Laverle Hobby, PA-C   50 mg at 01/07/14 2128    Lab Results:  Results for orders placed during the hospital encounter of 01/03/14 (from the past 48 hour(s))  GLUCOSE, CAPILLARY     Status: Abnormal   Collection Time    01/07/14  5:14 PM      Result Value Range   Glucose-Capillary 122 (*) 70 - 99 mg/dL   Comment 1 Notify RN    GLUCOSE, CAPILLARY     Status: Abnormal   Collection Time    01/07/14  9:24 PM      Result Value Range   Glucose-Capillary 117 (*) 70 - 99 mg/dL   Comment 1 Notify RN     Comment 2 Documented in Chart    GLUCOSE, CAPILLARY     Status: Abnormal   Collection Time    01/08/14  6:33 AM      Result Value Range   Glucose-Capillary 120 (*) 70 - 99 mg/dL   Comment 1 Notify RN     Comment 2 Documented in Chart    GLUCOSE, CAPILLARY     Status: Abnormal   Collection Time    01/08/14  4:55 PM      Result Value Range   Glucose-Capillary 110 (*) 70 - 99 mg/dL  COMPREHENSIVE METABOLIC PANEL     Status: Abnormal   Collection Time    01/08/14  7:45 PM      Result Value Range   Sodium 139  137 -  147 mEq/L   Potassium 4.2  3.7 - 5.3 mEq/L   Chloride 101  96 - 112 mEq/L   CO2 27  19 - 32 mEq/L   Glucose, Bld 135 (*) 70 - 99 mg/dL   BUN 25 (*) 6 - 23 mg/dL  Creatinine, Ser 1.47 (*) 0.50 - 1.35 mg/dL   Calcium 8.8  8.4 - 10.5 mg/dL   Total Protein 6.7  6.0 - 8.3 g/dL   Albumin 3.3 (*) 3.5 - 5.2 g/dL   AST 83 (*) 0 - 37 U/L   ALT 63 (*) 0 - 53 U/L   Alkaline Phosphatase 48  39 - 117 U/L   Total Bilirubin 0.3  0.3 - 1.2 mg/dL   GFR calc non Af Amer 48 (*) >90 mL/min   GFR calc Af Amer 56 (*) >90 mL/min   Comment: (NOTE)     The eGFR has been calculated using the CKD EPI equation.     This calculation has not been validated in all clinical situations.     eGFR's persistently <90 mL/min signify possible Chronic Kidney     Disease.     Performed at [facility]  GLUCOSE, CAPILLARY     Status: Abnormal   Collection Time    01/08/14  9:16 PM      Result Value Range   Glucose-Capillary 121 (*) 70 - 99 mg/dL   Comment 1 Notify RN    GLUCOSE, CAPILLARY     Status: Abnormal   Collection Time    01/09/14  6:40 AM      Result Value Range   Glucose-Capillary 104 (*) 70 - 99 mg/dL   Comment 1 Notify RN      Physical Findings: AIMS: Facial and Oral Movements Muscles of Facial Expression: None, normal Lips and Perioral Area: None, normal Jaw: None, normal Tongue: None, normal,Extremity Movements Upper (arms, wrists, hands, fingers): None, normal Lower (legs, knees, ankles, toes): None, normal, Trunk Movements Neck, shoulders, hips: None, normal, Overall Severity Severity of abnormal movements (highest score from questions above): None, normal Incapacitation due to abnormal movements: None, normal Patient's awareness of abnormal movements (rate only patient's report): No Awareness, Dental Status Current problems with teeth and/or dentures?: No Does patient usually wear dentures?: No  CIWA:  CIWA-Ar Total: 4 COWS:  COWS Total Score: 3  Treatment Plan  Summary: Daily contact with patient to assess and evaluate symptoms and progress in treatment Medication management  Plan: Supportive approach/coping skills/relpse prevention Initiate Hydroxyzine 25 mg tid for anxiety symptoms. Reassess BP medications, continue Amlodipine at 5 mg daily  Check electrolytes, reviewed most current CMP panel reports. Continue current treatment plans.  Medical Decision Making Problem Points:  Review of psycho-social stressors (1) Data Points:  Review of medication regiment & side effects (2) Review of new medications or change in dosage (2)  I certify that inpatient services furnished can reasonably be expected to improve the patient's condition.   Lindell Spar I, PMHNP-BC 01/09/2014, 1:26 PM

## 2014-01-10 LAB — GLUCOSE, CAPILLARY
GLUCOSE-CAPILLARY: 97 mg/dL (ref 70–99)
GLUCOSE-CAPILLARY: 103 mg/dL — AB (ref 70–99)
Glucose-Capillary: 118 mg/dL — ABNORMAL HIGH (ref 70–99)
Glucose-Capillary: 114 mg/dL — ABNORMAL HIGH (ref 70–99)
Glucose-Capillary: 114 mg/dL — ABNORMAL HIGH (ref 70–99)

## 2014-01-10 MED ORDER — GABAPENTIN 100 MG PO CAPS
200.0000 mg | ORAL_CAPSULE | Freq: Three times a day (TID) | ORAL | Status: DC
  Administered 2014-01-10 – 2014-01-17 (×21): 200 mg via ORAL
  Filled 2014-01-10 (×9): qty 2
  Filled 2014-01-10: qty 24
  Filled 2014-01-10 (×8): qty 2
  Filled 2014-01-10: qty 24
  Filled 2014-01-10 (×7): qty 2
  Filled 2014-01-10: qty 24

## 2014-01-10 NOTE — Progress Notes (Signed)
D:  Patient's self inventory sheet, patient sleeps fair, improving appetite, low energy level, good attention span.  Rated depression 8, hopeless 7.  Stated he has felt agitated at times.  Has felt lightheaded occasionally.  Pain goal 4, worst pain 4.  Plans to change his life after discharge.  Does have discharge plans.  No problems with medications after discharge. A:  Medications administered per MD orders.  Emotional support and encouragement given patient. R:  Denied SI and HI.  Denied A/V hallucinations.  Will continue to monitor patient for safety with 15 minute checks.  Safety maintained.

## 2014-01-10 NOTE — Tx Team (Signed)
Interdisciplinary Treatment Plan Update (Adult)  Date: 01/10/2014   Time Reviewed: 10:55 AM  Progress in Treatment:  Attending groups: Yes  Participating in groups:  Yes  Taking medication as prescribed: Yes  Tolerating medication: Yes  Family/Significant othe contact made: No. SPE not required for this pt.  Patient understands diagnosis: Yes, AEB seeking treatment for mood stabilization and ETOH detox.  Discussing patient identified problems/goals with staff: Yes  Medical problems stabilized or resolved: Yes  Denies suicidal/homicidal ideation: Yes  Patient has not harmed self or Others: Yes  New problem(s) identified: Pt reporting physical weakness but feels more mentally alert.  Discharge Plan or Barriers: Pt plans to return home and follow up o/p at [facility] for med management.  Additional comments:  Reason for Continuation of Hospitalization: Mood stabilization Medication management  Estimated length of stay: 2-3 days (likely d/c Friday)  For review of initial/current patient goals, please see plan of care.  Attendees:  Patient:    Family:    Physician: Carlton Adam MD 01/10/2014 10:55 AM   Nursing: Butch Penny RN  01/10/2014 10:55 AM   Clinical Social Worker [place], [place]  01/10/2014 10:55 AM   Other: Adonis Huguenin RN  01/10/2014 10:55 AM   Other: Gerline Legacy Nurse CM 01/10/2014 10:55 AM   Other:  Rise Paganini RN 01/10/2014 10:55 AM   Other:    Scribe for Treatment Team:  [place] LCSWA 01/10/2014 10:55 AM

## 2014-01-10 NOTE — Progress Notes (Signed)
  D: Pt observed sleeping in bed with eyes closed. RR even and unlabored. No distress noted  .  A: Q 15 minute checks were done for safety.  R: safety maintained on unit.  

## 2014-01-10 NOTE — BHH Group Notes (Signed)
[facility] LCSW Aftercare Discharge Planning Group Note   01/10/2014 9:15 AM  Participation Quality:  Appropriate   Mood/Affect:  Flat  Depression Rating:  7  Anxiety Rating:  5  Thoughts of Suicide:  No Will you contract for safety?   NA  Current AVH:  No  Plan for Discharge/Comments:  Pt reports that he is feeling weak today but is more mentally alert. Pt feels that it is unsafe for him to d/c at this time due to physical weakness and high anxiety/depression. Pt reports mild  Tremors. He plans to return home at d/c and follow up with [facility] for med management. He was given info to [facility] for grief counseling. He also plans to attend AA meetings.   Transportation Means: unknown at this time.   Supports: some family supports identified.   [employer]

## 2014-01-10 NOTE — Progress Notes (Signed)
Adult Psychoeducational Group Note  Date:  01/10/2014 Time:  10:34 PM  Group Topic/Focus:  NA group  Participation Level:  Active  Participation Quality:  Appropriate  Affect:  Appropriate  Cognitive:  Alert  Insight: Appropriate  Engagement in Group:  Engaged  Modes of Intervention:  Discussion  Additional Comments:    Sharmon Revere 01/10/2014, 10:34 PM

## 2014-01-10 NOTE — BHH Group Notes (Signed)
[place] LCSW Group Therapy  01/10/2014 3:14 PM  Type of Therapy:  Group Therapy  Participation Level:  Active  Participation Quality:  Attentive and Sharing  Affect:  Flat  Cognitive:  Alert and Oriented  Insight:  Developing/Improving  Engagement in Therapy:  Engaged  Modes of Intervention:  Discussion and Exploration  Summary of Progress/Problems:Emotion Regulation: This group focused on both positive and negative emotion identification and allowed group members to process ways to identify feelings, regulate negative emotions, and find healthy ways to manage internal/external emotions. Group members were asked to reflect on a time when their reaction to an emotion led to a negative outcome and explored how alternative responses using emotion regulation would have benefited them. Group members were also asked to discuss a time when emotion regulation was utilized when a negative emotion was experienced. Kevin [name] was very engaged in the conversation and processed how he feels hopeless with no purpose since he has retired. He shares he gets bored and reaches to call negative friends in effort to dull his pain of saddness and hopelessness. He was even more insightful reporting his real issues is depression which is a trigger for abusing alcohol. He shares that treatment would really help him, but he has been afraid to open up the flood of emotions he has been pushing down and instead reaches for something to numb all the feelings.    [name] 01/10/2014, 3:14 PM

## 2014-01-10 NOTE — Progress Notes (Signed)
Patient ID: Kevin [name], male   DOB: 11-May-1946, 68 y.o.   MRN: [id number] Patient ID: Kevin [name], male   DOB: 03-24-1946, 68 y.o.   MRN: [id number] [facility] MD Progress Note  01/10/2014 3:45 PM Kevin [name]  MRN:  [id number]  Subjective:  Zyree states "I'm still weak. It is slow for me getting stronger this time. Recovering from this drinking is very slow. I still shake some, and feeling restless inside. I need some more days to start feeling better. I need to turn my life around. Once I get out of here, no more drinking".  Diagnosis:   DSM5: Schizophrenia Disorders:  none Obsessive-Compulsive Disorders:  none Trauma-Stressor Disorders:  none Substance/Addictive Disorders:  Alcohol Related Disorder - Severe (303.90) Depressive Disorders:  Major Depressive Disorder - Moderate (296.22) Total Time spent with patient: 30 minutes  Axis I: Generalized Anxiety Disorder and Substance Induced Mood Disorder  ADL's:  Intact  Sleep: "Improving"  Appetite:  Fair  Suicidal Ideation:  Plan:  denies Intent:  denies Means:  denies  Homicidal Ideation:  Plan:  denies Intent:  denies Means:  denies AEB (as evidenced by):  Psychiatric Specialty Exam: Physical Exam  Review of Systems  Constitutional: Positive for malaise/fatigue.  HENT: Negative.   Eyes: Negative.   Respiratory: Negative.   Cardiovascular: Negative.   Gastrointestinal: Negative.   Genitourinary: Negative.   Skin: Negative.   Neurological: Positive for dizziness and weakness.  Endo/Heme/Allergies: Negative.   Psychiatric/Behavioral: Positive for depression (currently on medication for stabilization.) and substance abuse. Negative for suicidal ideas and memory loss. The patient is nervous/anxious and has insomnia.     Blood pressure 111/73, pulse 73, temperature 98.2 F (36.8 C), temperature source Oral, resp. rate 18, height 5' 9.5" (1.765 m), weight 103.42 kg (228 lb).Body mass index is 33.2 kg/(m^2).  General  Appearance: Fairly Groomed  Engineer, water::  Fair  Speech:  Clear and Coherent and Slow  Volume:  Decreased  Mood:  weak, tired, worried  Affect:  Restricted  Thought Process:  Coherent and Goal Directed  Orientation:  Full (Time, Place, and Person)  Thought Content:  symptoms, worries, concerns  Suicidal Thoughts:  No  Homicidal Thoughts:  No  Memory:  Immediate;   Fair Recent;   Fair Remote;   Fair  Judgement:  Fair  Insight:  Present  Psychomotor Activity:  Restlessness and Tremor  Concentration:  Fair  Recall:  [employer] of [place]:Fair  Language: Fair  Akathisia:  No  Handed:    AIMS (if indicated):     Assets:  Desire for Improvement  Sleep:  Number of Hours: 6.5   Musculoskeletal: Strength & Muscle Tone: within normal limits Gait & Station: normal Patient leans: N/A  Current Medications: Current Facility-Administered Medications  Medication Dose Route Frequency Provider Last Rate Last Dose  . acetaminophen (TYLENOL) tablet 650 mg  650 mg Oral Q6H PRN Laverle Hobby, PA-C      . alum & mag hydroxide-simeth (MAALOX/MYLANTA) 200-200-20 MG/5ML suspension 30 mL  30 mL Oral Q4H PRN Laverle Hobby, PA-C      . amLODipine (NORVASC) tablet 5 mg  5 mg Oral Daily Nicholaus Bloom, MD   5 mg at 01/10/14 [phone]  . citalopram (CELEXA) tablet 20 mg  20 mg Oral Daily Encarnacion Slates, NP   20 mg at 01/10/14 2111  . colchicine tablet 0.6 mg  0.6 mg Oral Daily Laverle Hobby, PA-C   0.6 mg at 01/10/14 5520  .  feeding supplement (GLUCERNA SHAKE) (GLUCERNA SHAKE) liquid 237 mL  237 mL Oral BID BM Christie Beckers, RD   237 mL at 01/10/14 1045  . gabapentin (NEURONTIN) capsule 100 mg  100 mg Oral TID Laverle Hobby, PA-C   100 mg at 01/10/14 1154  . hydrochlorothiazide (MICROZIDE) capsule 12.5 mg  12.5 mg Oral Daily Nicholaus Bloom, MD   12.5 mg at 01/10/14 0813  . hydrOXYzine (ATARAX/VISTARIL) tablet 25 mg  25 mg Oral TID Encarnacion Slates, NP   25 mg at 01/10/14 1154  . irbesartan (AVAPRO)  tablet 300 mg  300 mg Oral Daily Nicholaus Bloom, MD   300 mg at 01/10/14 2800  . levothyroxine (SYNTHROID, LEVOTHROID) tablet 100 mcg  100 mcg Oral Daily Laverle Hobby, PA-C   100 mcg at 01/10/14 0815  . magnesium hydroxide (MILK OF MAGNESIA) suspension 30 mL  30 mL Oral Daily PRN Laverle Hobby, PA-C      . metFORMIN (GLUCOPHAGE-XR) 24 hr tablet 1,000 mg  1,000 mg Oral Q24H Nicholaus Bloom, MD   1,000 mg at 01/09/14 2138  . multivitamin with minerals tablet 1 tablet  1 tablet Oral Daily Laverle Hobby, PA-C   1 tablet at 01/10/14 3491  . pantoprazole (PROTONIX) EC tablet 20 mg  20 mg Oral Daily Laverle Hobby, PA-C   20 mg at 01/10/14 7915  . thiamine (B-1) injection 100 mg  100 mg Intramuscular Once [employer], PA-C      . thiamine (VITAMIN B-1) tablet 100 mg  100 mg Oral Daily Laverle Hobby, PA-C   100 mg at 01/10/14 0569  . traZODone (DESYREL) tablet 50 mg  50 mg Oral QHS,MR X 1 Laverle Hobby, PA-C   50 mg at 01/07/14 2128    Lab Results:  Results for orders placed during the hospital encounter of 01/03/14 (from the past 48 hour(s))  GLUCOSE, CAPILLARY     Status: Abnormal   Collection Time    01/08/14  4:55 PM      Result Value Ref Range   Glucose-Capillary 110 (*) 70 - 99 mg/dL  COMPREHENSIVE METABOLIC PANEL     Status: Abnormal   Collection Time    01/08/14  7:45 PM      Result Value Ref Range   Sodium 139  137 - 147 mEq/L   Potassium 4.2  3.7 - 5.3 mEq/L   Chloride 101  96 - 112 mEq/L   CO2 27  19 - 32 mEq/L   Glucose, Bld 135 (*) 70 - 99 mg/dL   BUN 25 (*) 6 - 23 mg/dL   Creatinine, Ser 1.47 (*) 0.50 - 1.35 mg/dL   Calcium 8.8  8.4 - 10.5 mg/dL   Total Protein 6.7  6.0 - 8.3 g/dL   Albumin 3.3 (*) 3.5 - 5.2 g/dL   AST 83 (*) 0 - 37 U/L   ALT 63 (*) 0 - 53 U/L   Alkaline Phosphatase 48  39 - 117 U/L   Total Bilirubin 0.3  0.3 - 1.2 mg/dL   GFR calc non Af Amer 48 (*) >90 mL/min   GFR calc Af Amer 56 (*) >90 mL/min   Comment: (NOTE)     The eGFR has been  calculated using the CKD EPI equation.     This calculation has not been validated in all clinical situations.     eGFR's persistently <90 mL/min signify possible Chronic Kidney     Disease.  Performed at [facility]  GLUCOSE, CAPILLARY     Status: Abnormal   Collection Time    01/08/14  9:16 PM      Result Value Ref Range   Glucose-Capillary 121 (*) 70 - 99 mg/dL   Comment 1 Notify RN    GLUCOSE, CAPILLARY     Status: Abnormal   Collection Time    01/09/14  6:40 AM      Result Value Ref Range   Glucose-Capillary 104 (*) 70 - 99 mg/dL   Comment 1 Notify RN    GLUCOSE, CAPILLARY     Status: None   Collection Time    01/09/14  5:10 PM      Result Value Ref Range   Glucose-Capillary 97  70 - 99 mg/dL   Comment 1 Notify RN    GLUCOSE, CAPILLARY     Status: Abnormal   Collection Time    01/09/14  9:42 PM      Result Value Ref Range   Glucose-Capillary 118 (*) 70 - 99 mg/dL   Comment 1 Notify RN    GLUCOSE, CAPILLARY     Status: Abnormal   Collection Time    01/10/14  6:51 AM      Result Value Ref Range   Glucose-Capillary 114 (*) 70 - 99 mg/dL  GLUCOSE, CAPILLARY     Status: None   Collection Time    01/10/14 12:18 PM      Result Value Ref Range   Glucose-Capillary 97  70 - 99 mg/dL    Physical Findings: AIMS: Facial and Oral Movements Muscles of Facial Expression: None, normal Lips and Perioral Area: None, normal Jaw: None, normal Tongue: None, normal,Extremity Movements Upper (arms, wrists, hands, fingers): None, normal Lower (legs, knees, ankles, toes): None, normal, Trunk Movements Neck, shoulders, hips: None, normal, Overall Severity Severity of abnormal movements (highest score from questions above): None, normal Incapacitation due to abnormal movements: None, normal Patient's awareness of abnormal movements (rate only patient's report): No Awareness, Dental Status Current problems with teeth and/or dentures?: No Does patient usually wear  dentures?: No  CIWA:  CIWA-Ar Total: 1 COWS:  COWS Total Score: 1  Treatment Plan Summary: Daily contact with patient to assess and evaluate symptoms and progress in treatment Medication management  Plan: Supportive approach/coping skills/relpse prevention Continue Hydroxyzine 25 mg tid for anxiety symptoms. Increased Neurontin from 100 mg to 200 mg three times daily for substance withdrawal syndrome.  Check electrolytes, reviewed most current CMP panel reports. Continue current treatment plans.  Medical Decision Making Problem Points:  Review of psycho-social stressors (1) Data Points:  Review of medication regiment & side effects (2) Review of new medications or change in dosage (2)  I certify that inpatient services furnished can reasonably be expected to improve the patient's condition.   Encarnacion Slates, PMHNP-BC 01/10/2014, 3:45 PM Agree with assessment and plan Geralyn Flash A. Sabra Heck, M.D.

## 2014-01-10 NOTE — Progress Notes (Signed)
Patient ID: Kevin [name], male   DOB: 03/28/1946, 67 y.o.   MRN: [id number] D: Patient states "i feel well and getting my strength back". Pt states the sleep medication made him unable to get out of bed and refuse to take it. Pt states his gout is still bothering him and needs medication for it. Pt reports decreased anxiety and depressive symptoms. Pt denies suicidal /homicidal ideation intent and plan. Pt denies auditory and visual hallucination. Pt attended evening AA group and engaged in discussion. Pt denies any needs or concerns. Cooperative with assessment. No acute distressed noted at this time.   A: Met with pt 1:1. Medications explained and administered as prescribed to pt.  Writer encouraged pt to discuss feelings. Pt encouraged to come to staff with any questions or concerns.   R: Patient remains safe. He is complaint with medications and denies any adverse reaction. Continue current POC.     

## 2014-01-11 LAB — GLUCOSE, CAPILLARY
Glucose-Capillary: 102 mg/dL — ABNORMAL HIGH (ref 70–99)
Glucose-Capillary: 119 mg/dL — ABNORMAL HIGH (ref 70–99)
Glucose-Capillary: 101 mg/dL — ABNORMAL HIGH (ref 70–99)

## 2014-01-11 NOTE — Progress Notes (Signed)
Recreation Therapy Notes  Animal-Assisted Activity/Therapy (AAA/T) Program Checklist/Progress Notes Patient Eligibility Criteria Checklist & Daily Group note for Rec Tx Intervention  Date: 02.12.2015 Time: 2:45pm Location: [address]    AAA/T Program Assumption of Risk Form signed by Patient/ or Parent Legal Guardian yes  Patient is free of allergies or sever asthma yes  Patient reports no fear of animals yes  Patient reports no history of cruelty to animals yes   Patient understands his/her participation is voluntary yes  Behavioral Response: Did not attend.   Laureen Ochs Seith Aikey, LRT/CTRS  Lane Hacker 01/11/2014 5:29 PM

## 2014-01-11 NOTE — Progress Notes (Signed)
Patient ID: Kevin [name], male   DOB: 12/24/1945, 67 y.o.   MRN: [id number] D: Patient reports feeling better today. Pt observed sitting in dayroom interacting with peers. Pt reports decreased anxiety and depressive symptoms. Pt denies suicidal /homicidal ideation intent and plan. Pt denies auditory and visual hallucination. Pt attended evening wrap up group and engaged in discussion. Pt denies any needs or concerns. Cooperative with assessment. No acute distressed noted at this time.   A: Met with pt 1:1. Medications explained and administered as prescribed to pt.  Writer encouraged pt to discuss feelings. Pt encouraged to come to staff with any questions or concerns.   R: Patient remains safe. He is complaint with medications and denies any adverse reaction. Continue current POC.     

## 2014-01-11 NOTE — B[facility] Group Notes (Signed)
[facility] LCSW Group Therapy  01/11/2014 2:56 PM  Type of Therapy:  Group Therapy  Participation Level:  Active  Participation Quality:  Attentive  Affect:  Appropriate  Cognitive:  Alert and Oriented  Insight:  Engaged  Engagement in Therapy:  Engaged  Modes of Intervention:  Confrontation, Discussion, Education, Exploration, Problem-solving, Rapport Building, Socialization and Support  Summary of Progress/Problems:  Finding Balance in Life. Today's group focused on defining balance in one's own words, identifying things that can knock one off balance, and exploring healthy ways to maintain balance in life. Group members were asked to provide an example of a time when they felt off balance, describe how they handled that situation,and process healthier ways to regain balance in the future. Group members were asked to share the most important tool for maintaining balance that they learned while at [facility] and how they plan to apply this method after discharge. Elishua was attentive and engaged throughout today's therapy group. He shared that to him, balance meant "having a good relationship with my family, having money to support myself, being in recovery, and being back with my cat in my apt." Georgi shows progress in the group setting and improving insight AEB his ability to identify triggers/things that throw him off balance "not dealing with depression or going to AA, coping with grief by drinking." Cristobal Goldmann shared that spending time with his cat "Oreo" brings him joy and makes him feel happy.    Smart, Tenesha Garza LCSWA 01/11/2014, 2:56 PM

## 2014-01-11 NOTE — Progress Notes (Signed)
Recreation Therapy Notes  Date: 02.11.2015 Time: 2:45pm Location: [address] Dayroom   Group Topic: Anger Management  Goal Area(s) Addresses:  Patient will identify body's physical reaction to anger.  Patient will identify positive coping mechanisms to deal with anger.  Patient will select one coping mechanism of choice to use post d/c when experiencing anger.   Behavioral Response: Did not attend.    Rashaun Wichert L Mikylah Ackroyd, LRT/CTRS   Xaiden Fleig L 01/11/2014 9:12 AM 

## 2014-01-11 NOTE — Progress Notes (Signed)
The focus of this group is to educate the patient on the purpose and policies of crisis stabilization and provide a format to answer questions about their admission.  The group details unit policies and expectations of patients while admitted.  Pt was an active participant in 0900 group.  His goal for today "manage my depression"

## 2014-01-11 NOTE — Progress Notes (Signed)
Kevin [name]  01/11/2014 2:46 PM Kevin [name]  MRN:  [id number] Subjective:  Kevin [name] is still endorsing he feels weak, down, but now he is able to push himself  out of bed while before he could not do it. Trying to go to every group and stay up and walk around. He states that at home he got to be completely inactive and the only thing he was doing was drinking. 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 He is also going to find meetings and be involved with church.  Diagnosis:   DSM5: Schizophrenia Disorders:  none Obsessive-Compulsive Disorders:  none Trauma-Stressor Disorders:  none Substance/Addictive Disorders:  Alcohol Related Disorder - Severe (303.90) Depressive Disorders:  Major Depressive Disorder - Moderate (296.22) Total Time spent with patient: 30 minutes  Axis I: Generalized Anxiety Disorder  ADL's:  Intact  Sleep: Fair  Appetite:  Fair  Suicidal Ideation:  Plan:  denies Intent:  denies Means:  denies Homicidal Ideation:  Plan:  denies Intent:  denies Means:  denies AEB (as evidenced by):  Psychiatric Specialty Exam: Physical Exam  Review of Systems  Constitutional: Positive for malaise/fatigue.  HENT: Negative.   Eyes: Negative.   Respiratory: Negative.   Cardiovascular: Negative.   Gastrointestinal: Negative.   Genitourinary: Negative.   Musculoskeletal: Positive for myalgias.  Skin: Negative.   Neurological: Positive for weakness.  Endo/Heme/Allergies: Negative.   Psychiatric/Behavioral: Positive for depression and substance abuse.    Blood pressure 125/80, pulse 71, temperature 97.3 F (36.3 C), temperature source Oral, resp. rate 16, height 5' 9.5" (1.765 m), weight 103.42 kg (228 lb).Body mass index is 33.2 kg/(m^2).  General Appearance: Fairly Groomed  Engineer, water::  Fair  Speech:  Clear and Coherent and Slow  Volume:   Decreased  Mood:  Worried  Affect:  worried  Thought Process:  Coherent and Goal Directed  Orientation:  Full (Time, Place, and Person)  Thought Content:  symptoms, events, worries, concerns  Suicidal Thoughts:  No  Homicidal Thoughts:  No  Memory:  Immediate;   Fair Recent;   Fair Remote;   Fair  Judgement:  Fair  Insight:  Present  Psychomotor Activity:  Restlessness  Concentration:  Fair  Recall:  [employer] of [place]: Fair  Akathisia:  No  Handed:    AIMS (if indicated):     Assets:  Desire for Improvement Housing  Sleep:  Number of Hours: 6.25   Musculoskeletal: Strength & Muscle Tone: within normal limits Gait & Station: normal Patient leans: N/A  Current Medications: Current Facility-Administered Medications  Medication Dose Route Frequency Provider Last Rate Last Dose  . acetaminophen (TYLENOL) tablet 650 mg  650 mg Oral Q6H PRN Laverle Hobby, PA-C      . alum & mag hydroxide-simeth (MAALOX/MYLANTA) 200-200-20 MG/5ML suspension 30 mL  30 mL Oral Q4H PRN Laverle Hobby, PA-C      . amLODipine (NORVASC) tablet 5 mg  5 mg Oral Daily Nicholaus Bloom, MD   5 mg at 01/11/14 0745  . citalopram (CELEXA) tablet 20 mg  20 mg Oral Daily Encarnacion Slates, NP   20 mg at 01/11/14 0744  . colchicine tablet 0.6 mg  0.6 mg Oral Daily Laverle Hobby, PA-C   0.6 mg at 01/11/14 0745  . feeding supplement (GLUCERNA SHAKE) (GLUCERNA SHAKE) liquid 237  mL  237 mL Oral BID BM Christie Beckers, RD   237 mL at 01/11/14 1104  . gabapentin (NEURONTIN) capsule 200 mg  200 mg Oral TID Encarnacion Slates, NP   200 mg at 01/11/14 1143  . hydrochlorothiazide (MICROZIDE) capsule 12.5 mg  12.5 mg Oral Daily Nicholaus Bloom, MD   12.5 mg at 01/11/14 0744  . hydrOXYzine (ATARAX/VISTARIL) tablet 25 mg  25 mg Oral TID Encarnacion Slates, NP   25 mg at 01/11/14 1143  . irbesartan (AVAPRO) tablet 300 mg  300 mg Oral Daily Nicholaus Bloom, MD   300 mg at 01/11/14 0744  . levothyroxine (SYNTHROID,  LEVOTHROID) tablet 100 mcg  100 mcg Oral Daily Laverle Hobby, PA-C   100 mcg at 01/11/14 0748  . magnesium hydroxide (MILK OF MAGNESIA) suspension 30 mL  30 mL Oral Daily PRN Laverle Hobby, PA-C      . metFORMIN (GLUCOPHAGE-XR) 24 hr tablet 1,000 mg  1,000 mg Oral Q24H Nicholaus Bloom, MD   1,000 mg at 01/10/14 2140  . multivitamin with minerals tablet 1 tablet  1 tablet Oral Daily Laverle Hobby, PA-C   1 tablet at 01/11/14 0744  . pantoprazole (PROTONIX) EC tablet 20 mg  20 mg Oral Daily Laverle Hobby, PA-C   20 mg at 01/11/14 0744  . thiamine (B-1) injection 100 mg  100 mg Intramuscular Once [employer], PA-C      . thiamine (VITAMIN B-1) tablet 100 mg  100 mg Oral Daily Laverle Hobby, PA-C   100 mg at 01/11/14 0745  . traZODone (DESYREL) tablet 50 mg  50 mg Oral QHS,MR X 1 Laverle Hobby, PA-C   50 mg at 01/07/14 2128    Lab Results:  Results for orders placed during the hospital encounter of 01/03/14 (from the past 48 hour(s))  GLUCOSE, CAPILLARY     Status: None   Collection Time    01/09/14  5:10 PM      Result Value Ref Range   Glucose-Capillary 97  70 - 99 mg/dL   Comment 1 Notify RN    GLUCOSE, CAPILLARY     Status: Abnormal   Collection Time    01/09/14  9:42 PM      Result Value Ref Range   Glucose-Capillary 118 (*) 70 - 99 mg/dL   Comment 1 Notify RN    GLUCOSE, CAPILLARY     Status: Abnormal   Collection Time    01/10/14  6:51 AM      Result Value Ref Range   Glucose-Capillary 114 (*) 70 - 99 mg/dL  GLUCOSE, CAPILLARY     Status: None   Collection Time    01/10/14 12:18 PM      Result Value Ref Range   Glucose-Capillary 97  70 - 99 mg/dL  GLUCOSE, CAPILLARY     Status: Abnormal   Collection Time    01/10/14  4:36 PM      Result Value Ref Range   Glucose-Capillary 103 (*) 70 - 99 mg/dL  GLUCOSE, CAPILLARY     Status: Abnormal   Collection Time    01/10/14  9:12 PM      Result Value Ref Range   Glucose-Capillary 114 (*) 70 - 99 mg/dL  GLUCOSE,  CAPILLARY     Status: Abnormal   Collection Time    01/11/14  6:05 AM      Result Value Ref Range   Glucose-Capillary 102 (*)  70 - 99 mg/dL    Physical Findings: AIMS: Facial and Oral Movements Muscles of Facial Expression: None, normal Lips and Perioral Area: None, normal Jaw: None, normal Tongue: None, normal,Extremity Movements Upper (arms, wrists, hands, fingers): None, normal Lower (legs, knees, ankles, toes): None, normal, Trunk Movements Neck, shoulders, hips: None, normal, Overall Severity Severity of abnormal movements (highest score from questions above): None, normal Incapacitation due to abnormal movements: None, normal Patient's awareness of abnormal movements (rate only patient's report): No Awareness, Dental Status Current problems with teeth and/or dentures?: No Does patient usually wear dentures?: No  CIWA:  CIWA-Ar Total: 1 COWS:  COWS Total Score: 1  Treatment Plan Summary: Daily contact with patient to assess and evaluate symptoms and progress in treatment Medication management  Plan: Supportive approach/coping skills/relapse prevention           Continue Celexa 20 mg daily           Work on behavioral activation  Medical Decision Making Problem Points:  Review of psycho-social stressors (1) Data Points:  Review of medication regiment & side effects (2)  I certify that inpatient services furnished can reasonably be expected to improve the patient's condition.   Maevis Mumby A 01/11/2014, 2:46 PM

## 2014-01-11 NOTE — Progress Notes (Signed)
Patient ID: Kevin [name], male   DOB: 11-24-1946, 68 y.o.   MRN: [id number] He has been up and to groups.Interacting with staff and employees. Has been out of room and in dayroom more today. Self inventory: Self inventory:depression 6. Hopelessness 4, Withdrawal symptoms agitation,and he denies SI thoughts.

## 2014-01-11 NOTE — Progress Notes (Signed)
Patient ID: Kevin [name], male   DOB: 1946/07/01, 68 y.o.   MRN: [id number] PER STATE REGULATIONS 482.30  THIS CHART WAS REVIEWED FOR MEDICAL NECESSITY WITH RESPECT TO THE PATIENT'S ADMISSION/ DURATION OF STAY.  NEXT REVIEW DATE: 01/14/2014  Chauncy Lean, RN, BSN CASE MANAGER

## 2014-01-12 LAB — GLUCOSE, CAPILLARY
GLUCOSE-CAPILLARY: 105 mg/dL — AB (ref 70–99)
GLUCOSE-CAPILLARY: 97 mg/dL (ref 70–99)
Glucose-Capillary: 108 mg/dL — ABNORMAL HIGH (ref 70–99)
Glucose-Capillary: 114 mg/dL — ABNORMAL HIGH (ref 70–99)

## 2014-01-12 MED ORDER — IRBESARTAN 300 MG PO TABS
300.0000 mg | ORAL_TABLET | Freq: Every day | ORAL | Status: DC
  Administered 2014-01-13 – 2014-01-16 (×4): 300 mg via ORAL
  Filled 2014-01-12 (×6): qty 1

## 2014-01-12 NOTE — Progress Notes (Signed)
Patient ID: Kevin [name], male   DOB: 1946/06/18, 68 y.o.   MRN: [id number] [facility] MD Progress Note  01/12/2014 2:17 PM Kevin [name]  MRN:  [id number]  Subjective:  Kevin [name] is still endorsing generalized weakness. However, says he is trying to make himself move around and do things to gain his strength back. He is still complaining of feeling very depressed especially in the early hours of the morning. He also complained of feeling some light headedness, which also tends to clear as the day progresses. Requested his blood pressure medicine dosing time to be switched from day time to q hs as that was how he was dosing it at home. He is currently denying any SIHI, AVH.   Diagnosis:   DSM5: Schizophrenia Disorders:  none Obsessive-Compulsive Disorders:  none Trauma-Stressor Disorders:  none Substance/Addictive Disorders:  Alcohol Related Disorder - Severe (303.90) Depressive Disorders:  Major Depressive Disorder - Moderate (296.22) Total Time spent with patient: 30 minutes  Axis I: Generalized Anxiety Disorder  ADL's:  Intact  Sleep: Fair  Appetite:  Fair  Suicidal Ideation:  Plan:  denies Intent:  denies Means:  denies  Homicidal Ideation:  Plan:  denies Intent:  denies Means:  denies  AEB (as evidenced by):  Psychiatric Specialty Exam: Physical Exam  Review of Systems  Constitutional: Positive for malaise/fatigue.  HENT: Negative.   Eyes: Negative.   Respiratory: Negative.   Cardiovascular: Negative.   Gastrointestinal: Negative.   Genitourinary: Negative.   Musculoskeletal: Positive for myalgias.  Skin: Negative.   Neurological: Positive for weakness.  Endo/Heme/Allergies: Negative.   Psychiatric/Behavioral: Positive for depression and substance abuse.    Blood pressure 122/77, pulse 78, temperature 98.5 F (36.9 C), temperature source Oral, resp. rate 16, height 5' 9.5" (1.765 m), weight 103.42 kg (228 lb).Body mass index is 33.2 kg/(m^2).  General Appearance:  Fairly Groomed  Engineer, water::  Fair  Speech:  Clear and Coherent and Slow  Volume:  Decreased  Mood:  Worried  Affect:  worried  Thought Process:  Coherent and Goal Directed  Orientation:  Full (Time, Place, and Person)  Thought Content:  symptoms, events, worries, concerns  Suicidal Thoughts:  No  Homicidal Thoughts:  No  Memory:  Immediate;   Fair Recent;   Fair Remote;   Fair  Judgement:  Fair  Insight:  Present  Psychomotor Activity:  Restlessness  Concentration:  Fair  Recall:  [employer] of [place]: Fair  Akathisia:  No  Handed:    AIMS (if indicated):     Assets:  Desire for Improvement Housing  Sleep:  Number of Hours: 5.25   Musculoskeletal: Strength & Muscle Tone: within normal limits Gait & Station: normal Patient leans: N/A  Current Medications: Current Facility-Administered Medications  Medication Dose Route Frequency Provider Last Rate Last Dose  . acetaminophen (TYLENOL) tablet 650 mg  650 mg Oral Q6H PRN Laverle Hobby, PA-C      . alum & mag hydroxide-simeth (MAALOX/MYLANTA) 200-200-20 MG/5ML suspension 30 mL  30 mL Oral Q4H PRN Laverle Hobby, PA-C      . amLODipine (NORVASC) tablet 5 mg  5 mg Oral Daily Nicholaus Bloom, MD   5 mg at 01/12/14 5427  . citalopram (CELEXA) tablet 20 mg  20 mg Oral Daily Encarnacion Slates, NP   20 mg at 01/12/14 0813  . colchicine tablet 0.6 mg  0.6 mg Oral Daily Laverle Hobby, PA-C   0.6 mg at 01/12/14 0813  . feeding  supplement (GLUCERNA SHAKE) (GLUCERNA SHAKE) liquid 237 mL  237 mL Oral BID BM Christie Beckers, RD   237 mL at 01/12/14 1405  . gabapentin (NEURONTIN) capsule 200 mg  200 mg Oral TID Encarnacion Slates, NP   200 mg at 01/12/14 1207  . hydrochlorothiazide (MICROZIDE) capsule 12.5 mg  12.5 mg Oral Daily Nicholaus Bloom, MD   12.5 mg at 01/12/14 6803  . hydrOXYzine (ATARAX/VISTARIL) tablet 25 mg  25 mg Oral TID Encarnacion Slates, NP   25 mg at 01/12/14 1206  . [START ON 01/13/2014] irbesartan (AVAPRO) tablet  300 mg  300 mg Oral Q2000 Encarnacion Slates, NP      . levothyroxine (SYNTHROID, LEVOTHROID) tablet 100 mcg  100 mcg Oral Daily Laverle Hobby, PA-C   100 mcg at 01/12/14 2122  . magnesium hydroxide (MILK OF MAGNESIA) suspension 30 mL  30 mL Oral Daily PRN Laverle Hobby, PA-C      . metFORMIN (GLUCOPHAGE-XR) 24 hr tablet 1,000 mg  1,000 mg Oral Q24H Nicholaus Bloom, MD   1,000 mg at 01/11/14 2000  . multivitamin with minerals tablet 1 tablet  1 tablet Oral Daily Laverle Hobby, PA-C   1 tablet at 01/12/14 4825  . pantoprazole (PROTONIX) EC tablet 20 mg  20 mg Oral Daily Laverle Hobby, PA-C   20 mg at 01/12/14 0037  . thiamine (B-1) injection 100 mg  100 mg Intramuscular Once [employer], PA-C      . thiamine (VITAMIN B-1) tablet 100 mg  100 mg Oral Daily Laverle Hobby, PA-C   100 mg at 01/12/14 0488  . traZODone (DESYREL) tablet 50 mg  50 mg Oral QHS,MR X 1 Laverle Hobby, PA-C   50 mg at 01/07/14 2128    Lab Results:  Results for orders placed during the hospital encounter of 01/03/14 (from the past 48 hour(s))  GLUCOSE, CAPILLARY     Status: Abnormal   Collection Time    01/10/14  4:36 PM      Result Value Ref Range   Glucose-Capillary 103 (*) 70 - 99 mg/dL  GLUCOSE, CAPILLARY     Status: Abnormal   Collection Time    01/10/14  9:12 PM      Result Value Ref Range   Glucose-Capillary 114 (*) 70 - 99 mg/dL  GLUCOSE, CAPILLARY     Status: Abnormal   Collection Time    01/11/14  6:05 AM      Result Value Ref Range   Glucose-Capillary 102 (*) 70 - 99 mg/dL  GLUCOSE, CAPILLARY     Status: Abnormal   Collection Time    01/11/14  5:12 PM      Result Value Ref Range   Glucose-Capillary 119 (*) 70 - 99 mg/dL   Comment 1 Notify RN    GLUCOSE, CAPILLARY     Status: Abnormal   Collection Time    01/11/14  9:17 PM      Result Value Ref Range   Glucose-Capillary 101 (*) 70 - 99 mg/dL   Comment 1 Notify RN     Comment 2 Documented in Chart    GLUCOSE, CAPILLARY     Status: Abnormal    Collection Time    01/12/14  6:35 AM      Result Value Ref Range   Glucose-Capillary 108 (*) 70 - 99 mg/dL   Comment 1 Notify RN    GLUCOSE, CAPILLARY     Status: Abnormal  Collection Time    01/12/14 11:56 AM      Result Value Ref Range   Glucose-Capillary 105 (*) 70 - 99 mg/dL    Physical Findings: AIMS: Facial and Oral Movements Muscles of Facial Expression: None, normal Lips and Perioral Area: None, normal Jaw: None, normal Tongue: None, normal,Extremity Movements Upper (arms, wrists, hands, fingers): None, normal Lower (legs, knees, ankles, toes): None, normal, Trunk Movements Neck, shoulders, hips: None, normal, Overall Severity Severity of abnormal movements (highest score from questions above): None, normal Incapacitation due to abnormal movements: None, normal Patient's awareness of abnormal movements (rate only patient's report): No Awareness, Dental Status Current problems with teeth and/or dentures?: No Does patient usually wear dentures?: No  CIWA:  CIWA-Ar Total: 0 COWS:  COWS Total Score: 1  Treatment Plan Summary: Daily contact with patient to assess and evaluate symptoms and progress in treatment Medication management  Plan: Supportive approach/coping skills/relapse prevention Continue Celexa 20 mg daily Encouraged group participation. Discharge plans in progress.  Medical Decision Making Problem Points:  Review of psycho-social stressors (1) Data Points:  Review of medication regiment & side effects (2)  I certify that inpatient services furnished can reasonably be expected to improve the patient's condition.   Lindell Spar I, PMHNP-BC 01/12/2014, 2:17 PM

## 2014-01-12 NOTE — Progress Notes (Signed)
Pt attended AA meeting.  

## 2014-01-12 NOTE — Progress Notes (Signed)
Patient ID: Kevin [name], male   DOB: 08/22/1946, 68 y.o.   MRN: [id number] D: Patient reports "feeling better today and getting his strength back". Pt observed sitting in dayroom interacting with peers. Pt reports decreased anxiety and depressive symptoms. Pt rates anxiety and depression at 3 on a 0-10 scale. Pt denies suicidal /homicidal ideation intent and plan. Pt denies auditory and visual hallucination. Pt attended evening AA group and engaged in discussion. Pt denies any needs or concerns. Cooperative with assessment. No acute distressed noted at this time.   A: Met with pt 1:1. Medications explained and administered as prescribed to pt.  Writer encouraged pt to discuss feelings. Pt encouraged to come to staff with any questions or concerns.   R: Patient remains safe. He is complaint with medications and denies any adverse reaction. Continue current POC.

## 2014-01-12 NOTE — Progress Notes (Signed)
D: Patient denies SI/HI or AVH.  Pt. States that his sleep has been fair and appetite is good.  Pt. Has a depressed mood and flat affect.  Pt. Has been active within the milieu and is attending and participating in groups.   A: Patient given emotional support from RN. Patient encouraged to come to staff with concerns and/or questions. Patient's medication routine continued. Patient's orders and plan of care reviewed.   R: Patient remains appropriate and cooperative. Will continue to monitor patient q15 minutes for safety.

## 2014-01-12 NOTE — Progress Notes (Signed)
[facility] Group Notes:  (Nursing/MHT/Case Management/Adjunct)  Date:  01/11/2014  Time:  2100 Type of Therapy:  wrap up group  Participation Level:  Active  Participation Quality:  Appropriate, Attentive, Sharing and Supportive  Affect:  Flat  Cognitive:  Appropriate  Insight:  Appropriate  Engagement in Group:  Engaged  Modes of Intervention:  Clarification, Education and Support  Summary of Progress/Problems: Pt reported feeling better today with having a lot more strength and an appetite.  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 Pt plans to spend more time with his friends, all of which do not drink.   Jacques Navy 01/12/2014, 1:50 AM

## 2014-01-12 NOTE — Tx Team (Signed)
Interdisciplinary Treatment Plan Update (Adult)  Date: 01/12/2014   Time Reviewed: 10:33 AM  Progress in Treatment:  Attending groups: Yes  Participating in groups:  Yes  Taking medication as prescribed: Yes  Tolerating medication: Yes  Family/Significant othe contact made: No. SPE not required for this pt.  Patient understands diagnosis: Yes, AEB seeking treatment for mood stabilization and ETOH detox.  Discussing patient identified problems/goals with staff: Yes  Medical problems stabilized or resolved: Yes  Denies suicidal/homicidal ideation: Yes  Patient has not harmed self or Others: Yes  New problem(s) identified: Pt reporting physical weakness but feels more mentally alert.  Discharge Plan or Barriers: Pt plans to return home and follow up o/p at [facility] for med management.  Additional comments:  Reason for Continuation of Hospitalization: Mood stabilization Medication management  Estimated length of stay: 2-3 days (d/c scheduled for Monday) For review of initial/current patient goals, please see plan of care.  Attendees:  Patient:    Family:    Physician: Carlton Adam MD 01/12/2014 10:33 AM   Nursing: Karsten Fells RN 01/12/2014 10:33 AM   Clinical Social Worker [place], [place]  01/12/2014 10:33 AM   Other: Doroteo Bradford RN  01/12/2014 10:33 AM   Other: Gerline Legacy Nurse CM 01/12/2014 10:33 AM   Other:  Gerline Legacy Nurse CM 01/12/2014 10:33 AM   Other:    Scribe for Treatment Team:  [place] LCSWA 01/12/2014 10:33 AM

## 2014-01-12 NOTE — [facility] Group Notes (Signed)
[facility] LCSW Group Therapy  01/12/2014 2:59 PM  Type of Therapy:  Group Therapy  Participation Level:  Active  Participation Quality:  Attentive  Affect:  Appropriate  Cognitive:  Alert and Oriented  Insight:  Engaged  Engagement in Therapy:  Improving  Modes of Intervention:  Confrontation, Discussion, Education, Exploration, Problem-solving, Rapport Building, Socialization and Support  Summary of Progress/Problems: Feelings around Relapse. Group members discussed the meaning of relapse and shared personal stories of relapse, how it affected them and others, and how they perceived themselves during this time. Group members were encouraged to identify triggers, warning signs and coping skills used when facing the possibility of relapse. Social supports were discussed and explored in detail. Post Acute Withdrawal Syndrome (handout provided) was introduced and examined. Pt's were encouraged to ask questions, talk about key points associated with PAWS, and process this information in terms of relapse prevention. Kevin [name] was attentive and engaged throughout today's therapy group. He shared that he does not experience cravings at this time but acknowledges the importance of being prepared for this when he leaves the hospital setting. Kevin [name] stated that his friends do not drink and are supportive of him. He shows progress in the group setting and improving insight AEB his ability to process how having a safety plan in place will help him avoid relapse when faced with stressors or triggers in the future.    Smart, Antonin Meininger LCSWA  01/12/2014, 2:59 PM

## 2014-01-12 NOTE — [facility] Group Notes (Signed)
[facility] LCSW Aftercare Discharge Planning Group Note   01/12/2014 9:28 AM  Participation Quality: Appropriate    Mood/Affect:  Depressed and Flat  Depression Rating:  5  Anxiety Rating:  5  Thoughts of Suicide:  No Will you contract for safety?   NA  Current AVH:  No  Plan for Discharge/Comments:  Pt reports physical weakness but improving mental clarity. Pt reports severe depressive symptoms in the early morning that gradually lesson as the day progresses and is seeking medication adjustments if possible to address this.   Transportation Means: friend   Supports: friends and family identified   Smart, [place]

## 2014-01-12 NOTE — Progress Notes (Signed)
Adult Psychoeducational Group Note  Date:  01/12/2014 Time:  11:22 AM  Group Topic/Focus:  Building Self Esteem:   The Focus of this group is helping patients become aware of the effects of self-esteem on their lives, the things they and others do that enhance or undermine their self-esteem, seeing the relationship between their level of self-esteem and the choices they make and learning ways to enhance self-esteem. Self Esteem Action Plan:   The focus of this group is to help patients create a plan to continue to build self-esteem after discharge.  Participation Level:  Minimal  Participation Quality:  Drowsy  Affect:  Flat  Cognitive:  Appropriate  Insight: Good  Engagement in Group:  Engaged once awake  Modes of Intervention:  Discussion, Socialization and Support  Additional Comments:  Pts discussed personal experiences of what depletes their self-esteem and what has increased self-esteem in their lives. Pt was asleep through the first half of group. However, once pt woke up he was able to contribute in a positive way towards the group.  Clint Bolder 01/12/2014, 11:22 AM

## 2014-01-13 LAB — GLUCOSE, CAPILLARY
GLUCOSE-CAPILLARY: 105 mg/dL — AB (ref 70–99)
Glucose-Capillary: 94 mg/dL (ref 70–99)
Glucose-Capillary: 106 mg/dL — ABNORMAL HIGH (ref 70–99)
Glucose-Capillary: 146 mg/dL — ABNORMAL HIGH (ref 70–99)

## 2014-01-13 NOTE — Progress Notes (Signed)
Patient ID: Kevin [name], male   DOB: 03-14-1946, 68 y.o.   MRN: [id number]   [facility] MD Progress Note  01/13/2014 4:18 PM Greggory Safranek  MRN:  [id number]  Subjective:   Patient states "I now realize I have been depressed all my life. I never understood why I had no energy or motivation. I don't think I would keep drinking if I was receiving treatment for my depression. 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 My appetite and motivation level are improving."  Objective:  [name] is visible on the unit and attending the scheduled groups. He is observed interacting some with peers. Patient is reporting decreasing symptoms of depression. Today he rates his depression at five reporting it was ten on admission to the hospital. Patient reports that he is slowly becoming more energetic. He reports that he drank alcohol for nearly seven months straight showing insight about the negative effects that had on his health stating "I was hardly drinking water. I'm sure that I got very dehydrated."   Diagnosis:   DSM5: Schizophrenia Disorders:  none Obsessive-Compulsive Disorders:  none Trauma-Stressor Disorders:  none Substance/Addictive Disorders:  Alcohol Related Disorder - Severe (303.90) Depressive Disorders:  Major Depressive Disorder - Moderate (296.22) Total Time spent with patient: 30 minutes  Axis I: Generalized Anxiety Disorder and Major Depression, Recurrent severe  ADL's:  Intact  Sleep: Fair  Appetite:  Fair  Suicidal Ideation:  Plan:  denies Intent:  denies Means:  denies  Homicidal Ideation:  Plan:  denies Intent:  denies Means:  denies  AEB (as evidenced by):  Psychiatric Specialty Exam: Physical Exam  Review of Systems  Constitutional: Positive for malaise/fatigue.  HENT: Negative.   Eyes: Negative.   Respiratory: Negative.   Cardiovascular: Negative.   Gastrointestinal: Negative.   Genitourinary: Negative.   Musculoskeletal: Positive for myalgias.  Skin:  Negative.   Neurological: Positive for weakness.  Endo/Heme/Allergies: Negative.   Psychiatric/Behavioral: Positive for depression and substance abuse. Negative for suicidal ideas, hallucinations and memory loss. The patient has insomnia. The patient is not nervous/anxious.     Blood pressure 122/76, pulse 73, temperature 98 F (36.7 C), temperature source Oral, resp. rate 18, height 5' 9.5" (1.765 m), weight 103.42 kg (228 lb).Body mass index is 33.2 kg/(m^2).  General Appearance: Fairly Groomed  Engineer, water::  Fair  Speech:  Clear and Coherent and Slow  Volume:  Decreased  Mood:  Anxious  Affect:  Depressed  Thought Process:  Coherent and Goal Directed  Orientation:  Full (Time, Place, and Person)  Thought Content:  Rumination   Suicidal Thoughts:  No  Homicidal Thoughts:  No  Memory:  Immediate;   Fair Recent;   Fair Remote;   Fair  Judgement:  Fair  Insight:  Present  Psychomotor Activity:  Psychomotor Retardation  Concentration:  Fair  Recall:  [employer] of Knowledge:Fair  Language: Fair  Akathisia:  No  Handed:  Right   AIMS (if indicated):     Assets:  Desire for Improvement Housing Leisure Time Resilience  Sleep:  Number of Hours: 5.25   Musculoskeletal: Strength & Muscle Tone: within normal limits Gait & Station: normal Patient leans: N/A  Current Medications: Current Facility-Administered Medications  Medication Dose Route Frequency Provider Last Rate Last Dose  . acetaminophen (TYLENOL) tablet 650 mg  650 mg Oral Q6H PRN Laverle Hobby, PA-C      . alum & mag hydroxide-simeth (MAALOX/MYLANTA) 200-200-20 MG/5ML suspension 30 mL  30 mL  Oral Q4H PRN Laverle Hobby, PA-C      . amLODipine (NORVASC) tablet 5 mg  5 mg Oral Daily Nicholaus Bloom, MD   5 mg at 01/13/14 0754  . citalopram (CELEXA) tablet 20 mg  20 mg Oral Daily Encarnacion Slates, NP   20 mg at 01/13/14 0754  . colchicine tablet 0.6 mg  0.6 mg Oral Daily Laverle Hobby, PA-C   0.6 mg at 01/13/14 0754   . feeding supplement (GLUCERNA SHAKE) (GLUCERNA SHAKE) liquid 237 mL  237 mL Oral BID BM Christie Beckers, RD   237 mL at 01/13/14 1319  . gabapentin (NEURONTIN) capsule 200 mg  200 mg Oral TID Encarnacion Slates, NP   200 mg at 01/13/14 1614  . hydrochlorothiazide (MICROZIDE) capsule 12.5 mg  12.5 mg Oral Daily Nicholaus Bloom, MD   12.5 mg at 01/13/14 0755  . hydrOXYzine (ATARAX/VISTARIL) tablet 25 mg  25 mg Oral TID Encarnacion Slates, NP   25 mg at 01/13/14 1614  . irbesartan (AVAPRO) tablet 300 mg  300 mg Oral Q2000 Encarnacion Slates, NP      . levothyroxine (SYNTHROID, LEVOTHROID) tablet 100 mcg  100 mcg Oral Daily Laverle Hobby, PA-C   100 mcg at 01/13/14 0754  . magnesium hydroxide (MILK OF MAGNESIA) suspension 30 mL  30 mL Oral Daily PRN Laverle Hobby, PA-C      . metFORMIN (GLUCOPHAGE-XR) 24 hr tablet 1,000 mg  1,000 mg Oral Q24H Nicholaus Bloom, MD   1,000 mg at 01/12/14 2157  . multivitamin with minerals tablet 1 tablet  1 tablet Oral Daily Laverle Hobby, PA-C   1 tablet at 01/13/14 0754  . pantoprazole (PROTONIX) EC tablet 20 mg  20 mg Oral Daily Laverle Hobby, PA-C   20 mg at 01/13/14 0754  . thiamine (B-1) injection 100 mg  100 mg Intramuscular Once [employer], PA-C      . thiamine (VITAMIN B-1) tablet 100 mg  100 mg Oral Daily Laverle Hobby, PA-C   100 mg at 01/13/14 0754  . traZODone (DESYREL) tablet 50 mg  50 mg Oral QHS,MR X 1 Laverle Hobby, PA-C   50 mg at 01/07/14 2128    Lab Results:  Results for orders placed during the hospital encounter of 01/03/14 (from the past 48 hour(s))  GLUCOSE, CAPILLARY     Status: Abnormal   Collection Time    01/11/14  5:12 PM      Result Value Ref Range   Glucose-Capillary 119 (*) 70 - 99 mg/dL   Comment 1 Notify RN    GLUCOSE, CAPILLARY     Status: Abnormal   Collection Time    01/11/14  9:17 PM      Result Value Ref Range   Glucose-Capillary 101 (*) 70 - 99 mg/dL   Comment 1 Notify RN     Comment 2 Documented in Chart    GLUCOSE,  CAPILLARY     Status: Abnormal   Collection Time    01/12/14  6:35 AM      Result Value Ref Range   Glucose-Capillary 108 (*) 70 - 99 mg/dL   Comment 1 Notify RN    GLUCOSE, CAPILLARY     Status: Abnormal   Collection Time    01/12/14 11:56 AM      Result Value Ref Range   Glucose-Capillary 105 (*) 70 - 99 mg/dL  GLUCOSE, CAPILLARY  Status: None   Collection Time    01/12/14  4:52 PM      Result Value Ref Range   Glucose-Capillary 97  70 - 99 mg/dL  GLUCOSE, CAPILLARY     Status: Abnormal   Collection Time    01/12/14  9:10 PM      Result Value Ref Range   Glucose-Capillary 114 (*) 70 - 99 mg/dL  GLUCOSE, CAPILLARY     Status: None   Collection Time    01/13/14  6:15 AM      Result Value Ref Range   Glucose-Capillary 94  70 - 99 mg/dL   Comment 1 Notify RN    GLUCOSE, CAPILLARY     Status: Abnormal   Collection Time    01/13/14 11:43 AM      Result Value Ref Range   Glucose-Capillary 106 (*) 70 - 99 mg/dL    Physical Findings: AIMS: Facial and Oral Movements Muscles of Facial Expression: None, normal Lips and Perioral Area: None, normal Jaw: None, normal Tongue: None, normal,Extremity Movements Upper (arms, wrists, hands, fingers): None, normal Lower (legs, knees, ankles, toes): None, normal, Trunk Movements Neck, shoulders, hips: None, normal, Overall Severity Severity of abnormal movements (highest score from questions above): None, normal Incapacitation due to abnormal movements: None, normal Patient's awareness of abnormal movements (rate only patient's report): No Awareness, Dental Status Current problems with teeth and/or dentures?: No Does patient usually wear dentures?: No  CIWA:  CIWA-Ar Total: 2 COWS:  COWS Total Score: 1  Treatment Plan Summary: Daily contact with patient to assess and evaluate symptoms and progress in treatment Medication management  Plan: Supportive approach/coping skills/relapse prevention Continue Celexa 20 mg daily for  depressive symptoms.  Encouraged group participation. Discharge plans in progress. Address health issues: Vitals reviewed and stable. Monitoring CBG's achs. Continue Metformin XR as ordered for management of prediabetes with recent A1C of 5.7.   Medical Decision Making Problem Points:  Established problem, stable/improving (1), Review of last therapy session (1) and Review of psycho-social stressors (1) Data Points:  Review or order clinical lab tests (1) Review of medication regiment & side effects (2)  I certify that inpatient services furnished can reasonably be expected to improve the patient's condition.   Elmarie Shiley, NP-C 01/13/2014, 4:18 PM  Patient seen, evaluated and I agree with notes by Nurse Practitioner. Corena Pilgrim, MD

## 2014-01-13 NOTE — Progress Notes (Signed)
[facility] Group Notes:  (Nursing/MHT/Case Management/Adjunct)  Date:  01/13/2014  Time:  2100 Type of Therapy:  wrap up group  Participation Level:  Active  Participation Quality:  Appropriate, Attentive, Sharing and Supportive  Affect:  Appropriate  Cognitive:  Appropriate  Insight:  Appropriate  Engagement in Group:  Engaged  Modes of Intervention:  Clarification, Education and Support  Summary of Progress/Problems: Pt reports his anxiety level has subsided a lot. Pt attributes this to time passing after detox. Pt feels that a few more days in the hospital to regain his strength physically and mentally would help him. Pt feels like he isn't quite there yet.   Jacques Navy 01/13/2014, 10:30 PM

## 2014-01-13 NOTE — [facility] Group Notes (Signed)
[facility] Group Notes:  (Clinical Social Work)  01/13/2014     10-11AM  Summary of Progress/Problems:   The main focus of today's process group was for the patient to identify ways in which they have in the past sabotaged their own recovery. Motivational Interviewing and a worksheet were utilized to help patients explore the perceived benefits and costs of their substance use, as well as the potential benefits and costs of stopping.  The Stages of Change were explained using a handout, and patients identified where they currently are with regard to stages of change.  The patient expressed that if the use of substances were stopped, he would expect to feel good if he stays on his depression medications.    Type of Therapy:  Group Therapy - Process   Participation Level:  Active  Participation Quality:  Attentive, Sharing and Supportive  Affect:  Appropriate  Cognitive:  Appropriate and Oriented  Insight:  Engaged  Engagement in Therapy:  Engaged  Modes of Intervention:  Education, Psychiatric nurse, Motivational Interviewing  Selmer Dominion, LCSW 01/13/2014, 12:46 PM

## 2014-01-13 NOTE — Progress Notes (Signed)
Patient ID: Kevin [name], male   DOB: 1946-07-30, 68 y.o.   MRN: [id number]  D: Patient with dull, flat affect but is appropriate and cooperative with staff and peers. A: Q 15 minute safety checks, encourage staff/peer interaction and group participation. Administer medications as ordered by MD.  R: Patient compliant with medications and group sessions. Pt states he is ready to go home and he has a lot of support at home. Pt denies SI or plans to harm himself.

## 2014-01-13 NOTE — [facility] Group Notes (Signed)
[facility] Group Notes:  (Nursing/MHT/Case Management/Adjunct)  Date:  01/13/2014  Time:  9:40 AM  Type of Therapy:  Nurse Education  Participation Level:  Active  Participation Quality:  Attentive  Affect:  Appropriate  Cognitive:  Alert, Appropriate and Oriented  Insight:  Appropriate and Good  Engagement in Group:  Engaged  Modes of Intervention:  Discussion and Education  Summary of Progress/Problems: Group focused on ways to show those around you that you love and appreciate them and ways to improve on it. Pt states he tells his family that he loves them everyday and he also feels loved and close to his family.  Kevin [name] 01/13/2014, 9:40 AM

## 2014-01-14 DIAGNOSIS — F1020 Alcohol dependence, uncomplicated: Secondary | ICD-10-CM

## 2014-01-14 LAB — GLUCOSE, CAPILLARY
GLUCOSE-CAPILLARY: 101 mg/dL — AB (ref 70–99)
Glucose-Capillary: 98 mg/dL (ref 70–99)

## 2014-01-14 MED ORDER — TRAZODONE HCL 100 MG PO TABS
100.0000 mg | ORAL_TABLET | Freq: Every day | ORAL | Status: DC
  Filled 2014-01-14: qty 1
  Filled 2014-01-14: qty 4
  Filled 2014-01-14 (×3): qty 1

## 2014-01-14 NOTE — Progress Notes (Signed)
Patient ID: Kevin [name], male   DOB: 1945-12-14, 68 y.o.   MRN: [id number]     [facility] MD Progress Note  01/14/2014 1:53 PM Kevin [name]  MRN:  [id number]  Subjective:   Patient states "I am still having some waves of depression that occur in the morning. But I notice that it has been decreasing since being started on the antidepressant medication. I still can't believe that it took so long to figure out what was wrong with me. I suffered for so many years. It was hard for me to even get up for work before I was retired."   Objective:  Kevin [name] is visible on the unit and attending the scheduled groups. He is observed interacting some with peers. Patient is reporting decreasing symptoms of depression. Today he rates his depression at four reporting, which is showing steady but slow improvement.  He is starting to have more energy. Patient continues to insist that he was only abusing alcohol to cope with his depression. Kevin [name] is certain that drinking alcohol will not be a problem for him after discharge. Although patient has been showing signs of improvement his sleep remains poor.   Diagnosis:   DSM5: Schizophrenia Disorders:  none Obsessive-Compulsive Disorders:  none Trauma-Stressor Disorders:  none Substance/Addictive Disorders:  Alcohol Related Disorder - Severe (303.90) Depressive Disorders:  Major Depressive Disorder - Moderate (296.22) Total Time spent with patient: 30 minutes  Axis I: Generalized Anxiety Disorder and Major Depression, Recurrent severe  ADL's:  Intact  Sleep: Poor  Appetite:  Fair  Suicidal Ideation:  Plan:  denies Intent:  denies Means:  denies  Homicidal Ideation:  Plan:  denies Intent:  denies Means:  denies  AEB (as evidenced by):  Psychiatric Specialty Exam: Physical Exam  Review of Systems  Constitutional: Positive for malaise/fatigue.  HENT: Negative.   Eyes: Negative.   Respiratory: Negative.   Cardiovascular: Negative.   Gastrointestinal:  Negative.   Genitourinary: Negative.   Musculoskeletal: Negative.   Skin: Negative.   Endo/Heme/Allergies: Negative.   Psychiatric/Behavioral: Positive for depression and substance abuse. Negative for suicidal ideas, hallucinations and memory loss. The patient has insomnia. The patient is not nervous/anxious.     Blood pressure 124/77, pulse 56, temperature 97.8 F (36.6 C), temperature source Oral, resp. rate 24, height 5' 9.5" (1.765 m), weight 103.42 kg (228 lb).Body mass index is 33.2 kg/(m^2).  General Appearance: Fairly Groomed  Engineer, water::  Fair  Speech:  Clear and Coherent and Slow  Volume:  Decreased  Mood:  Anxious  Affect:  Depressed  Thought Process:  Coherent and Goal Directed  Orientation:  Full (Time, Place, and Person)  Thought Content:  Rumination   Suicidal Thoughts:  No  Homicidal Thoughts:  No  Memory:  Immediate;   Fair Recent;   Fair Remote;   Fair  Judgement:  Fair  Insight:  Present  Psychomotor Activity:  Psychomotor Retardation  Concentration:  Fair  Recall:  [employer] of Knowledge:Fair  Language: Fair  Akathisia:  No  Handed:  Right   AIMS (if indicated):     Assets:  Desire for Improvement Housing Leisure Time Resilience  Sleep:  Number of Hours: 1.75   Musculoskeletal: Strength & Muscle Tone: within normal limits Gait & Station: normal Patient leans: N/A  Current Medications: Current Facility-Administered Medications  Medication Dose Route Frequency Provider Last Rate Last Dose  . acetaminophen (TYLENOL) tablet 650 mg  650 mg Oral Q6H PRN Laverle Hobby, PA-C      .  alum & mag hydroxide-simeth (MAALOX/MYLANTA) 200-200-20 MG/5ML suspension 30 mL  30 mL Oral Q4H PRN Laverle Hobby, PA-C      . amLODipine (NORVASC) tablet 5 mg  5 mg Oral Daily Nicholaus Bloom, MD   5 mg at 01/14/14 0725  . citalopram (CELEXA) tablet 20 mg  20 mg Oral Daily Encarnacion Slates, NP   20 mg at 01/14/14 0725  . colchicine tablet 0.6 mg  0.6 mg Oral Daily Laverle Hobby, PA-C   0.6 mg at 01/14/14 0725  . feeding supplement (GLUCERNA SHAKE) (GLUCERNA SHAKE) liquid 237 mL  237 mL Oral BID BM Christie Beckers, RD   237 mL at 01/13/14 1319  . gabapentin (NEURONTIN) capsule 200 mg  200 mg Oral TID Encarnacion Slates, NP   200 mg at 01/14/14 1125  . hydrochlorothiazide (MICROZIDE) capsule 12.5 mg  12.5 mg Oral Daily Nicholaus Bloom, MD   12.5 mg at 01/14/14 0725  . hydrOXYzine (ATARAX/VISTARIL) tablet 25 mg  25 mg Oral TID Encarnacion Slates, NP   25 mg at 01/14/14 1125  . irbesartan (AVAPRO) tablet 300 mg  300 mg Oral Q2000 Encarnacion Slates, NP   300 mg at 01/13/14 2136  . levothyroxine (SYNTHROID, LEVOTHROID) tablet 100 mcg  100 mcg Oral Daily Laverle Hobby, PA-C   100 mcg at 01/14/14 0725  . magnesium hydroxide (MILK OF MAGNESIA) suspension 30 mL  30 mL Oral Daily PRN Laverle Hobby, PA-C      . metFORMIN (GLUCOPHAGE-XR) 24 hr tablet 1,000 mg  1,000 mg Oral Q24H Nicholaus Bloom, MD   1,000 mg at 01/13/14 2135  . multivitamin with minerals tablet 1 tablet  1 tablet Oral Daily Laverle Hobby, PA-C   1 tablet at 01/14/14 0725  . pantoprazole (PROTONIX) EC tablet 20 mg  20 mg Oral Daily Laverle Hobby, PA-C   20 mg at 01/14/14 0725  . thiamine (B-1) injection 100 mg  100 mg Intramuscular Once [employer], PA-C      . thiamine (VITAMIN B-1) tablet 100 mg  100 mg Oral Daily Laverle Hobby, PA-C   100 mg at 01/14/14 0725  . traZODone (DESYREL) tablet 50 mg  50 mg Oral QHS,MR X 1 Laverle Hobby, PA-C   50 mg at 01/07/14 2128    Lab Results:  Results for orders placed during the hospital encounter of 01/03/14 (from the past 48 hour(s))  GLUCOSE, CAPILLARY     Status: None   Collection Time    01/12/14  4:52 PM      Result Value Ref Range   Glucose-Capillary 97  70 - 99 mg/dL  GLUCOSE, CAPILLARY     Status: Abnormal   Collection Time    01/12/14  9:10 PM      Result Value Ref Range   Glucose-Capillary 114 (*) 70 - 99 mg/dL  GLUCOSE, CAPILLARY     Status: None    Collection Time    01/13/14  6:15 AM      Result Value Ref Range   Glucose-Capillary 94  70 - 99 mg/dL   Comment 1 Notify RN    GLUCOSE, CAPILLARY     Status: Abnormal   Collection Time    01/13/14 11:43 AM      Result Value Ref Range   Glucose-Capillary 106 (*) 70 - 99 mg/dL  GLUCOSE, CAPILLARY     Status: Abnormal   Collection Time  01/13/14  5:22 PM      Result Value Ref Range   Glucose-Capillary 146 (*) 70 - 99 mg/dL   Comment 1 Notify RN    GLUCOSE, CAPILLARY     Status: Abnormal   Collection Time    01/13/14  9:11 PM      Result Value Ref Range   Glucose-Capillary 105 (*) 70 - 99 mg/dL   Comment 1 Notify RN     Comment 2 Documented in Chart    GLUCOSE, CAPILLARY     Status: Abnormal   Collection Time    01/14/14  6:06 AM      Result Value Ref Range   Glucose-Capillary 101 (*) 70 - 99 mg/dL   Comment 1 Notify RN    GLUCOSE, CAPILLARY     Status: None   Collection Time    01/14/14 11:26 AM      Result Value Ref Range   Glucose-Capillary 98  70 - 99 mg/dL   Comment 1 Notify RN      Physical Findings: AIMS: Facial and Oral Movements Muscles of Facial Expression: None, normal Lips and Perioral Area: None, normal Jaw: None, normal Tongue: None, normal,Extremity Movements Upper (arms, wrists, hands, fingers): None, normal Lower (legs, knees, ankles, toes): None, normal, Trunk Movements Neck, shoulders, hips: None, normal, Overall Severity Severity of abnormal movements (highest score from questions above): None, normal Incapacitation due to abnormal movements: None, normal Patient's awareness of abnormal movements (rate only patient's report): No Awareness, Dental Status Current problems with teeth and/or dentures?: No Does patient usually wear dentures?: No  CIWA:  CIWA-Ar Total: 0 COWS:  COWS Total Score: 1  Treatment Plan Summary: Daily contact with patient to assess and evaluate symptoms and progress in treatment Medication management  Plan: Supportive  approach/coping skills/relapse prevention Continue Celexa 20 mg daily for depressive symptoms. Increase Trazodone to 100 mg hs for insomnia.  Encouraged group participation and healthy coping skills for depressive symptoms.  Discharge plans in progress. Address health issues: Vitals reviewed and stable. Monitoring CBG's achs. Continue Metformin XR as ordered for management of prediabetes with recent A1C of 5.7.   Medical Decision Making Problem Points:  Established problem, stable/improving (1), Review of last therapy session (1) and Review of psycho-social stressors (1) Data Points:  Review or order clinical lab tests (1) Review of medication regiment & side effects (2)  I certify that inpatient services furnished can reasonably be expected to improve the patient's condition.   Elmarie Shiley, NP-C 01/14/2014, 1:53 PM   Patient seen, evaluated and I agree with notes by Nurse Practitioner. Corena Pilgrim, MD

## 2014-01-14 NOTE — Progress Notes (Signed)
Patient observed in bed eyes closed with respirations even and unlabored.   Safety maintained, Q 15 checks continue.

## 2014-01-14 NOTE — BHH Group Notes (Signed)
[place] Group Notes:  (Clinical Social Work)  01/14/2014  10:00-11:00AM  Summary of Progress/Problems:   The main focus of today's process group was to   identify the patient's current support system and decide on other supports that can be put in place.    There was also an extensive discussion about what constitutes a healthy support versus an unhealthy support.  The patient expressed full comprehension of the concepts presented, and agreed that there is a need to add more supports.  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

## 2014-01-14 NOTE — Progress Notes (Signed)
[place] Group Notes:  (Nursing/MHT/Case Management/Adjunct)  Date:  01/14/2014  Time:  6:28 PM  Type of Therapy:  Psychoeducational Skills  Participation Level:  Active  Participation Quality:  Appropriate and Attentive  Affect:  Appropriate  Cognitive:  Appropriate  Insight:  Appropriate  Engagement in Group:  Engaged and Supportive  Modes of Intervention:  Activity  Summary of Progress/Problems: Pts played an activity of Pictionary using coping skills. Pts stated one coping skill they have learned while they have been in the hospital that they will use outside of the hospital.  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  Clint Bolder 01/14/2014, 6:28 PM

## 2014-01-14 NOTE — [facility] Group Notes (Signed)
[facility] Group Notes:  (Nursing/MHT/Case Management/Adjunct)  Date:  01/14/2014  Time:  1:49 PM  Type of Therapy:  Nurse Education  Participation Level:  Active  Participation Quality:  Drowsy  Affect:  Appropriate  Cognitive:  Appropriate and Oriented  Insight:  Appropriate  Engagement in Group:  Improving  Modes of Intervention:  Education  Summary of Progress/Problems: Video on "Dedicating Yourself To Sobriety". Discussion.  Kevin [name] 01/14/2014, 1:49 PM

## 2014-01-14 NOTE — Progress Notes (Signed)
Patient ID: Kevin [name], male   DOB: Apr 16, 1946, 68 y.o.   MRN: [id number]   D: Patient pleasant and cooperative with staff, states he is feeling better and is hopeful for the future. A: Q 15 minute safety checks, encourage staff/peer interaction and group participation. Administrate medications as ordered by MD.  R: Patient compliant with medications, and attended all group sessions. Pt denies SI or plans to harm himself.

## 2014-01-14 NOTE — [facility] Group Notes (Signed)
[facility] Group Notes:  (Nursing/MHT/Case Management/Adjunct) Psychoeducational Group Note  Date:  01/14/2014 Time:  0900  Group Topic/Focus:  Relapse Prevention Planning:   The focus of this group is to define relapse and discuss the need for planning to combat relapse.  Participation Level: Did Not Attend  Participation Quality:  Not Applicable  Affect:  Not Applicable  Cognitive:  Not Applicable  Insight:  Not Applicable  Engagement in Group: Not Applicable  Additional Comments:    Romie Minus 01/14/2014, 9:15 AM

## 2014-01-14 NOTE — Progress Notes (Signed)
Patient did attend the evening speaker AA meeting.  

## 2014-01-15 LAB — GLUCOSE, CAPILLARY: Glucose-Capillary: 96 mg/dL (ref 70–99)

## 2014-01-15 NOTE — Progress Notes (Signed)
The focus of this group is to help patients review their daily goal of treatment and discuss progress on daily workbooks. Pt attended the evening group session and responded to all discussion prompts from the [place]. Pt shared that he felt good today, the best that he has since his admission. The Writer observed that the Pt's appearance had improved as well with regard to hygiene and dress. Pt reported needing soap and deodorant, which were given to him following group. Pt's affect was appropriate and the Writer observed him smiling during wrap-up.

## 2014-01-15 NOTE — Tx Team (Signed)
Interdisciplinary Treatment Plan Update (Adult)  Date: 01/15/2014   Time Reviewed: 10:36 AM  Progress in Treatment:  Attending groups: Yes  Participating in groups:  Yes  Taking medication as prescribed: Yes  Tolerating medication: Yes  Family/Significant othe contact made: No. SPE not required for this pt.  Patient understands diagnosis: Yes, AEB seeking treatment for mood stabilization and ETOH detox.  Discussing patient identified problems/goals with staff: Yes  Medical problems stabilized or resolved: Yes  Denies suicidal/homicidal ideation: Yes  Patient has not harmed self or Others: Yes  New problem(s) identified: Pt STILL reporting physical weakness but feels more mentally alert.   Discharge Plan or Barriers: Pt plans to return home and follow up o/p at [facility] for med management.  Additional comments:  Reason for Continuation of Hospitalization: Mood stabilization Medication management  Estimated length of stay: 2-3 days d/c Wed likely.  For review of initial/current patient goals, please see plan of care.  Attendees:  Patient:    Family:    Physician: Carlton Adam MD 01/15/2014 10:36 AM   Nursing: Butch Penny RN 01/15/2014 10:36 AM   Clinical Social Worker [place], [place]  01/15/2014 10:36 AM   Other:Jennifer RN 01/15/2014 10:36 AM   Other: Gerline Legacy Nurse CM 01/15/2014 10:36 AM   Other:  Hardie Pulley. PA  01/15/2014 10:36 AM   Other:    Scribe for Treatment Team:  [place] LCSWA 01/15/2014 10:36 AM

## 2014-01-15 NOTE — BHH Group Notes (Signed)
[facility] LCSW Aftercare Discharge Planning Group Note   01/15/2014 9:17 AM  Participation Quality:  Appropriate   Mood/Affect:  Appropriate  Depression Rating:  3  Anxiety Rating:  2  Thoughts of Suicide:  No Will you contract for safety?   NA  Current AVH:  No  Plan for Discharge/Comments:  Pt reports that he is feeling somewhat stronger physically today but is experiencing cravings and is worried about d/cing home alone. Pt reports that his niece with be in town staying with him starting on Wed.   Transportation Means: friend    Supports: niece/some family supports    Proofreader, Research officer, trade union

## 2014-01-15 NOTE — Progress Notes (Signed)
[facility] MD Progress Note  01/15/2014 2:20 PM Kevin [name]  MRN:  [id number] Subjective:  Kevin [name] state he is still feeling weak, not feeling completely  recovered form this episode yet. Does not feel safe being at the house by himself. He states that there is a niece who is going to be able to come and stay with him in 2 days. He is concerned about being by himself. Expresses committment not to get to this point again as it has been harder to recover.  Diagnosis:   DSM5: Schizophrenia Disorders:  none Obsessive-Compulsive Disorders:  none Trauma-Stressor Disorders:  none Substance/Addictive Disorders:  Alcohol Related Disorder - Severe (303.90) Depressive Disorders:  Major Depressive Disorder - Moderate (296.22) Total Time spent with patient: 30 minutes  Axis I: Generalized Anxiety Disorder  ADL's:  Intact  Sleep: Fair  Appetite:  getting better  Suicidal Ideation:  Plan:  denies Intent:  denies Means:  denies Homicidal Ideation:  Plan:  denies Intent:  denies Means:  denies AEB (as evidenced by):  Psychiatric Specialty Exam: Physical Exam  Review of Systems  HENT: Negative.   Eyes: Negative.   Respiratory: Negative.   Cardiovascular: Negative.   Gastrointestinal: Negative.   Genitourinary: Negative.   Musculoskeletal: Negative.   Skin: Negative.   Neurological: Positive for weakness.  Endo/Heme/Allergies: Negative.   Psychiatric/Behavioral: Positive for depression and substance abuse. The patient is nervous/anxious.     Blood pressure 149/89, pulse 71, temperature 97.5 F (36.4 C), temperature source Oral, resp. rate 20, height 5' 9.5" (1.765 m), weight 103.42 kg (228 lb).Body mass index is 33.2 kg/(m^2).  General Appearance: Fairly Groomed  Engineer, water::  Fair  Speech:  Clear and Coherent and Slow  Volume:  Decreased  Mood:  Depressed and worried  Affect:  Restricted  Thought Process:  Coherent and Goal Directed  Orientation:  Full (Time, Place, and Person)   Thought Content:  symptoms, worries, concerns  Suicidal Thoughts:  No  Homicidal Thoughts:  No  Memory:  Immediate;   Fair Recent;   Fair Remote;   Fair  Judgement:  Fair  Insight:  Present and Shallow  Psychomotor Activity:  Restlessness  Concentration:  Fair  Recall:  [employer] of Knowledge:NA  Language: Fair  Akathisia:  No  Handed:    AIMS (if indicated):     Assets:  Desire for Improvement Housing  Sleep:  Number of Hours: 4.5   Musculoskeletal: Strength & Muscle Tone: within normal limits Gait & Station: normal Patient leans: N/A  Current Medications: Current Facility-Administered Medications  Medication Dose Route Frequency Provider Last Rate Last Dose  . acetaminophen (TYLENOL) tablet 650 mg  650 mg Oral Q6H PRN Laverle Hobby, PA-C      . alum & mag hydroxide-simeth (MAALOX/MYLANTA) 200-200-20 MG/5ML suspension 30 mL  30 mL Oral Q4H PRN Laverle Hobby, PA-C      . amLODipine (NORVASC) tablet 5 mg  5 mg Oral Daily Nicholaus Bloom, MD   5 mg at 01/15/14 0841  . citalopram (CELEXA) tablet 20 mg  20 mg Oral Daily Encarnacion Slates, NP   20 mg at 01/15/14 0841  . colchicine tablet 0.6 mg  0.6 mg Oral Daily Laverle Hobby, PA-C   0.6 mg at 01/15/14 0841  . feeding supplement (GLUCERNA SHAKE) (GLUCERNA SHAKE) liquid 237 mL  237 mL Oral BID BM Christie Beckers, RD   237 mL at 01/15/14 1117  . gabapentin (NEURONTIN) capsule 200 mg  200 mg  Oral TID Encarnacion Slates, NP   200 mg at 01/15/14 1203  . hydrochlorothiazide (MICROZIDE) capsule 12.5 mg  12.5 mg Oral Daily Nicholaus Bloom, MD   12.5 mg at 01/15/14 0841  . hydrOXYzine (ATARAX/VISTARIL) tablet 25 mg  25 mg Oral TID Encarnacion Slates, NP   25 mg at 01/15/14 1203  . irbesartan (AVAPRO) tablet 300 mg  300 mg Oral Q2000 Encarnacion Slates, NP   300 mg at 01/14/14 2115  . levothyroxine (SYNTHROID, LEVOTHROID) tablet 100 mcg  100 mcg Oral Daily Laverle Hobby, PA-C   100 mcg at 01/15/14 0841  . magnesium hydroxide (MILK OF MAGNESIA)  suspension 30 mL  30 mL Oral Daily PRN Laverle Hobby, PA-C      . metFORMIN (GLUCOPHAGE-XR) 24 hr tablet 1,000 mg  1,000 mg Oral Q24H Nicholaus Bloom, MD   1,000 mg at 01/14/14 2115  . multivitamin with minerals tablet 1 tablet  1 tablet Oral Daily Laverle Hobby, PA-C   1 tablet at 01/15/14 559 200 3700  . pantoprazole (PROTONIX) EC tablet 20 mg  20 mg Oral Daily Laverle Hobby, PA-C   20 mg at 01/15/14 0841  . thiamine (B-1) injection 100 mg  100 mg Intramuscular Once [employer], PA-C      . thiamine (VITAMIN B-1) tablet 100 mg  100 mg Oral Daily Laverle Hobby, PA-C   100 mg at 01/15/14 0841  . traZODone (DESYREL) tablet 100 mg  100 mg Oral QHS Elmarie Shiley, NP        Lab Results:  Results for orders placed during the hospital encounter of 01/03/14 (from the past 48 hour(s))  GLUCOSE, CAPILLARY     Status: Abnormal   Collection Time    01/13/14  5:22 PM      Result Value Ref Range   Glucose-Capillary 146 (*) 70 - 99 mg/dL   Comment 1 Notify RN    GLUCOSE, CAPILLARY     Status: Abnormal   Collection Time    01/13/14  9:11 PM      Result Value Ref Range   Glucose-Capillary 105 (*) 70 - 99 mg/dL   Comment 1 Notify RN     Comment 2 Documented in Chart    GLUCOSE, CAPILLARY     Status: Abnormal   Collection Time    01/14/14  6:06 AM      Result Value Ref Range   Glucose-Capillary 101 (*) 70 - 99 mg/dL   Comment 1 Notify RN    GLUCOSE, CAPILLARY     Status: None   Collection Time    01/14/14 11:26 AM      Result Value Ref Range   Glucose-Capillary 98  70 - 99 mg/dL   Comment 1 Notify RN    GLUCOSE, CAPILLARY     Status: None   Collection Time    01/15/14  6:09 AM      Result Value Ref Range   Glucose-Capillary 96  70 - 99 mg/dL    Physical Findings: AIMS: Facial and Oral Movements Muscles of Facial Expression: None, normal Lips and Perioral Area: None, normal Jaw: None, normal Tongue: None, normal,Extremity Movements Upper (arms, wrists, hands, fingers): None,  normal Lower (legs, knees, ankles, toes): None, normal, Trunk Movements Neck, shoulders, hips: None, normal, Overall Severity Severity of abnormal movements (highest score from questions above): None, normal Incapacitation due to abnormal movements: None, normal Patient's awareness of abnormal movements (rate only patient's report): No Awareness,  Dental Status Current problems with teeth and/or dentures?: No Does patient usually wear dentures?: No  CIWA:  CIWA-Ar Total: 0 COWS:  COWS Total Score: 1  Treatment Plan Summary: Daily contact with patient to assess and evaluate symptoms and progress in treatment Medication management  Plan: Supportive approach/coping skills/relapse prevention           Optimize treatment with psychotropics             Medical Decision Making Problem Points:  Review of psycho-social stressors (1) Data Points:  Review of medication regiment & side effects (2)  I certify that inpatient services furnished can reasonably be expected to improve the patient's condition.   Dashonna Chagnon A 01/15/2014, 2:20 PM

## 2014-01-15 NOTE — Progress Notes (Signed)
Patient ID: Kevin [name], male   DOB: May 07, 1946, 68 y.o.   MRN: [id number] PER STATE REGULATIONS 482.30  THIS CHART WAS REVIEWED FOR MEDICAL NECESSITY WITH RESPECT TO THE PATIENT'S ADMISSION/ DURATION OF STAY.  NEXT REVIEW DATE: 01/16/2014  Chauncy Lean, RN, BSN CASE MANAGER

## 2014-01-15 NOTE — Progress Notes (Signed)
Patient observed in bed with eyes closed, respirations even and unlabored; restless.  Patient safety maintained, Q 15 checks continue.

## 2014-01-15 NOTE — BHH Group Notes (Signed)
[facility] LCSW Group Therapy  01/15/2014 2:24 PM  Type of Therapy:  Group Therapy  Participation Level:  Active  Participation Quality:  Attentive  Affect:  Appropriate  Cognitive:  Alert and Oriented  Insight:  Engaged  Engagement in Therapy:  Improving  Modes of Intervention:  Confrontation, Discussion, Education, Exploration, Problem-solving, Rapport Building, Socialization and Support  Summary of Progress/Problems: Today's Topic: Overcoming Obstacles. Pt identified obstacles faced currently and processed barriers involved in overcoming these obstacles. Pt identified steps necessary for overcoming these obstacles and explored motivation (internal and external) for facing these difficulties head on. Pt further identified one area of concern in their lives and chose a skill of focus pulled from their "toolbox." Kevin [name] was attentive and engaged throughout today's therapy group. He shared that his biggest obstacle involves dealing with the grief of his sister's death in a healthy way. Kevin [name] shows progress in the group setting and improving insight AEB his ability to process how going to Hospice Grief counseling and AA meetings in addition to getting his medications monthly and taking as prescribed, will help him avoid relapse and cope with his stressors/grief. "I also have a supportive family and I have a cat named Oreo that loves me very much."    Smart, [name]  01/15/2014, 2:24 PM

## 2014-01-16 LAB — GLUCOSE, CAPILLARY: GLUCOSE-CAPILLARY: 110 mg/dL — AB (ref 70–99)

## 2014-01-16 NOTE — BHH Group Notes (Signed)
Adult Psychoeducational Group Note  Date:  01/16/2014 Time:  11:10 PM  Group Topic/Focus:  Wrap-Up Group:   The focus of this group is to help patients review their daily goal of treatment and discuss progress on daily workbooks.  Participation Level:  Active  Participation Quality:  Appropriate  Affect:  Appropriate  Cognitive:  Appropriate  Insight: Good  Engagement in Group:  Limited  Modes of Intervention:  Discussion  Additional Comments:  Kevin [name] stated that he had a good day, each day is getting better, he's gaining more strength and seeing improvement.  Kevin [name] A 01/16/2014, 11:10 PM

## 2014-01-16 NOTE — Progress Notes (Signed)
Patient ID: Kevin [name], male   DOB: Apr 13, 1946, 68 y.o.   MRN: [id number]   D: Pt informed the writer that he was better today than previous meeting, because he'd "gotten his strength back". Pt became slightly agitated when writer informed him that trazodone was on his hs med list. "I can't because trazodone sets me back 3 days like a zombie". Pt stated he scheduled to be discharged on Wed.   A:  Support and encouragement was offered. 15 min checks continued for safety.  R: Pt remains safe.

## 2014-01-16 NOTE — BHH Group Notes (Addendum)
[facility] LCSW Group Therapy  01/16/2014 2:24 PM  Type of Therapy:  Group Therapy  Participation Level:  Active  Participation Quality:  Attentive  Affect:  Appropriate  Cognitive:  Alert and Oriented  Insight:  Improving  Engagement in Therapy:  Improving  Modes of Intervention:  Confrontation, Discussion, Education, Exploration, Problem-solving, Rapport Building, Socialization and Support  Summary of Progress/Problems:CSW discussed the [employer] as well as other resources in the community. Patient expressed interest in their programs and services and received information on their agency. Discussed pt's discharge plan and obstacles/barriers that may present in executing their discharge plans.    Smart, Kevin [name] LCSWA  01/16/2014, 2:24 PM

## 2014-01-16 NOTE — Progress Notes (Addendum)
[facility] Therapy Notes  Date: 02.16.2015 Time: 2:45pm Location: [address] Day[address]   Group Topic: Wellness  Goal Area(s) Addresses:  Patient will define components of whole wellness. Patient will verbalize benefit of whole wellness. Patient will identified how neglecting wellness exacerbates depression/anxiety/substance abuse/ etc.  Behavioral Response: Did not attend.   Laureen Ochs Vickye Astorino, LRT/CTRS  Lane Hacker 01/16/2014 8:36 AM

## 2014-01-16 NOTE — Progress Notes (Signed)
[facility] Group Notes:  (Nursing/MHT/Case Management/Adjunct)  Date:  01/16/2014  Time:  3:35 PM  Type of Therapy:  Psychoeducational Skills  Participation Level:  Active  Participation Quality:  Appropriate  Affect:  Appropriate  Cognitive:  Alert and Appropriate  Insight:  Appropriate  Engagement in Group:  Engaged  Modes of Intervention:  Discussion  Summary of Progress/Problems: In group today we discussed coping skills and played coping skills pictionary. Pt reported that the coping skill that works best for him is going for long drives.  Harrie Foreman A 01/16/2014, 3:35 PM

## 2014-01-16 NOTE — Progress Notes (Signed)
Patient ID: Kevin [name], male   DOB: [birth year], 68 y.o.   MRN: [id number] He has been up and about and to groups interacting more with staff and peere. Stated that he was leaving tomorrow and that he was feeling much better seince starting on depression medication. Self inventory: depression 7, hopelessness 2, denies withdrawal symptoms,  Denies SI thoughts.

## 2014-01-16 NOTE — Progress Notes (Signed)
[facility] MD Progress Note  01/16/2014 4:42 PM [name]  MRN:  [id number] Subjective:  Kevin [name] states that he is feeling that his strength is coming back. He states he is committed to abstaining from drinking as states he has not felt this bad before. He is anticipating his niece to be able to come and stay with him starting tomorrow when he leaves. 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 States the way he was feeling when he came in he would not have thought he was going to be able to do it again.  Diagnosis:   DSM5: Schizophrenia Disorders:  none Obsessive-Compulsive Disorders:  none Trauma-Stressor Disorders:  none Substance/Addictive Disorders:  Alcohol Related Disorder - Severe (303.90) Depressive Disorders:  Major Depressive Disorder - Moderate (296.22) Total Time spent with patient: 30 minutes  Axis I: Generalized Anxiety Disorder and Substance Induced Mood Disorder  ADL's:  Intact  Sleep: Fair  Appetite:  Fair  Suicidal Ideation:  Plan:  denies Intent:  denies Means:  denies Homicidal Ideation:  Plan:  denies Intent:  denies Means:  denies AEB (as evidenced by):  Psychiatric Specialty Exam: Physical Exam  Review of Systems  HENT: Negative.   Eyes: Negative.   Cardiovascular: Negative.   Gastrointestinal: Negative.   Genitourinary: Negative.   Musculoskeletal: Negative.   Skin: Negative.   Neurological: Positive for weakness.  Endo/Heme/Allergies: Negative.   Psychiatric/Behavioral: Positive for substance abuse. The patient is nervous/anxious.     Blood pressure 125/75, pulse 80, temperature 98.5 F (36.9 C), temperature source Oral, resp. rate 17, height 5' 9.5" (1.765 m), weight 103.42 kg (228 lb).Body mass index is 33.2 kg/(m^2).  General Appearance: Fairly Groomed  Engineer, water::  Fair  Speech:  Clear and Coherent  Volume:  Decreased  Mood:  worried  Affect:  Appropriate   Thought Process:  Coherent and Goal Directed  Orientation:  Full (Time, Place, and Person)  Thought Content:  symptoms, worries, concerns  Suicidal Thoughts:  No  Homicidal Thoughts:  No  Memory:  Immediate;   Fair Recent;   Fair Remote;   Fair  Judgement:  Fair  Insight:  Present  Psychomotor Activity:  Normal  Concentration:  Fair  Recall:  [employer] [place]: Fair  Akathisia:  No  Handed:    AIMS (if indicated):     Assets:  Desire for Improvement Housing Social Support  Sleep:  Number of Hours: 5.5   Musculoskeletal: Strength & Muscle Tone: within normal limits Gait & Station: normal Patient leans: N/A  Current Medications: Current Facility-Administered Medications  Medication Dose Route Frequency Provider Last Rate Last Dose  . acetaminophen (TYLENOL) tablet 650 mg  650 mg Oral Q6H PRN Laverle Hobby, PA-C      . alum & mag hydroxide-simeth (MAALOX/MYLANTA) 200-200-20 MG/5ML suspension 30 mL  30 mL Oral Q4H PRN Laverle Hobby, PA-C      . amLODipine (NORVASC) tablet 5 mg  5 mg Oral Daily Nicholaus Bloom, MD   5 mg at 01/16/14 0825  . citalopram (CELEXA) tablet 20 mg  20 mg Oral Daily Encarnacion Slates, NP   20 mg at 01/16/14 0825  . colchicine tablet 0.6 mg  0.6 mg Oral Daily Laverle Hobby, PA-C   0.6 mg at 01/16/14 0825  . feeding supplement (GLUCERNA SHAKE) (GLUCERNA SHAKE) liquid 237 mL  237 mL Oral BID BM  Christie Beckers, RD   237 mL at 01/16/14 1417  . gabapentin (NEURONTIN) capsule 200 mg  200 mg Oral TID Encarnacion Slates, NP   200 mg at 01/16/14 1149  . hydrochlorothiazide (MICROZIDE) capsule 12.5 mg  12.5 mg Oral Daily Nicholaus Bloom, MD   12.5 mg at 01/16/14 0825  . hydrOXYzine (ATARAX/VISTARIL) tablet 25 mg  25 mg Oral TID Encarnacion Slates, NP   25 mg at 01/16/14 1149  . irbesartan (AVAPRO) tablet 300 mg  300 mg Oral Q2000 Encarnacion Slates, NP   300 mg at 01/15/14 2122  . levothyroxine (SYNTHROID, LEVOTHROID) tablet 100 mcg  100 mcg Oral Daily Laverle Hobby, PA-C   100 mcg at 01/16/14 0825  . magnesium hydroxide (MILK OF MAGNESIA) suspension 30 mL  30 mL Oral Daily PRN Laverle Hobby, PA-C      . metFORMIN (GLUCOPHAGE-XR) 24 hr tablet 1,000 mg  1,000 mg Oral Q24H Nicholaus Bloom, MD   1,000 mg at 01/15/14 2122  . multivitamin with minerals tablet 1 tablet  1 tablet Oral Daily Laverle Hobby, PA-C   1 tablet at 01/16/14 0825  . pantoprazole (PROTONIX) EC tablet 20 mg  20 mg Oral Daily Laverle Hobby, PA-C   20 mg at 01/16/14 0825  . thiamine (B-1) injection 100 mg  100 mg Intramuscular Once [employer], PA-C      . thiamine (VITAMIN B-1) tablet 100 mg  100 mg Oral Daily Laverle Hobby, PA-C   100 mg at 01/16/14 0825  . traZODone (DESYREL) tablet 100 mg  100 mg Oral QHS Elmarie Shiley, NP        Lab Results:  Results for orders placed during the hospital encounter of 01/03/14 (from the past 48 hour(s))  GLUCOSE, CAPILLARY     Status: None   Collection Time    01/15/14  6:09 AM      Result Value Ref Range   Glucose-Capillary 96  70 - 99 mg/dL  GLUCOSE, CAPILLARY     Status: Abnormal   Collection Time    01/16/14  6:32 AM      Result Value Ref Range   Glucose-Capillary 110 (*) 70 - 99 mg/dL    Physical Findings: AIMS: Facial and Oral Movements Muscles of Facial Expression: None, normal Lips and Perioral Area: None, normal Jaw: None, normal Tongue: None, normal,Extremity Movements Upper (arms, wrists, hands, fingers): None, normal Lower (legs, knees, ankles, toes): None, normal, Trunk Movements Neck, shoulders, hips: None, normal, Overall Severity Severity of abnormal movements (highest score from questions above): None, normal Incapacitation due to abnormal movements: None, normal Patient's awareness of abnormal movements (rate only patient's report): No Awareness, Dental Status Current problems with teeth and/or dentures?: No Does patient usually wear dentures?: No  CIWA:  CIWA-Ar Total: 0 COWS:  COWS Total Score:  1  Treatment Plan Summary: Daily contact with patient to assess and evaluate symptoms and progress in treatment Medication management  Plan: Supportive approach/coping skills/relapse prevention            Review the relapse prevention plan            Planning D/C in AM when niece is available to him  Medical Decision Making Problem Points:  Review of psycho-social stressors (1) Data Points:  Review of medication regiment & side effects (2)  I certify that inpatient services furnished can reasonably be expected to improve the patient's condition.   Gwendolyne Welford A 01/16/2014,  4:42 PM

## 2014-01-16 NOTE — Progress Notes (Signed)
Patient ID: Kevin [name], male   DOB: [birth year], 68 y.o.   MRN: [id number] PER STATE REGULATIONS 482.30  THIS CHART WAS REVIEWED FOR MEDICAL NECESSITY WITH RESPECT TO THE PATIENT'S ADMISSION/ DURATION OF STAY.  NEXT REVIEW DATE: 01/21/2014  Chauncy Lean, RN, BSN CASE MANAGER

## 2014-01-17 LAB — GLUCOSE, CAPILLARY: Glucose-Capillary: 104 mg/dL — ABNORMAL HIGH (ref 70–99)

## 2014-01-17 MED ORDER — OLMESARTAN-AMLODIPINE-HCTZ 40-10-12.5 MG PO TABS: 1.0000 | ORAL_TABLET | Freq: Every day | ORAL | Status: AC

## 2014-01-17 MED ORDER — COLCHICINE 0.6 MG PO TABS: 0.6000 mg | ORAL_TABLET | Freq: Every day | ORAL | Status: DC

## 2014-01-17 MED ORDER — LEVOTHYROXINE SODIUM 100 MCG PO TABS: 100.0000 ug | ORAL_TABLET | Freq: Every day | ORAL | Status: AC

## 2014-01-17 MED ORDER — METFORMIN HCL ER (MOD) 1000 MG PO TB24: 1000.0000 mg | ORAL_TABLET | Freq: Every day | ORAL | Status: DC

## 2014-01-17 MED ORDER — TRAZODONE HCL 100 MG PO TABS: 100.0000 mg | ORAL_TABLET | Freq: Every day | ORAL | Status: DC

## 2014-01-17 MED ORDER — HYDROXYZINE HCL 25 MG PO TABS: 25.0000 mg | ORAL_TABLET | Freq: Three times a day (TID) | ORAL | Status: DC

## 2014-01-17 MED ORDER — CITALOPRAM HYDROBROMIDE 20 MG PO TABS: 20.0000 mg | ORAL_TABLET | Freq: Every day | ORAL | Status: DC

## 2014-01-17 MED ORDER — GABAPENTIN 100 MG PO CAPS: 200.0000 mg | ORAL_CAPSULE | Freq: Three times a day (TID) | ORAL | Status: DC

## 2014-01-17 MED ORDER — LANSOPRAZOLE 30 MG PO CPDR: 30.0000 mg | DELAYED_RELEASE_CAPSULE | Freq: Every day | ORAL | Status: DC

## 2014-01-17 NOTE — BHH Group Notes (Signed)
[facility] LCSW Aftercare Discharge Planning Group Note   01/17/2014 10:28 AM  Participation Quality:  Appropriate   Mood/Affect:  Appropriate  Depression Rating:  4  Anxiety Rating:  3  Thoughts of Suicide:  No Will you contract for safety?   NA  Current AVH:  No  Plan for Discharge/Comments:  Pt reports that he is happy to be d/cing today. He will follow up with Dr. Harrington Challenger at [facility] and was given Hospice grief counseling info. He also plans to attend AA and reconnect with sponsor.   Transportation Means: friend   Supports: Teaching laboratory technician, Research officer, trade union

## 2014-01-17 NOTE — BHH Group Notes (Signed)
[facility] Group Notes:  (Nursing/MHT/Case Management/Adjunct)  Date:01/16/2014 Time:  0900 am  Type of Therapy:  Psychoeducational Skills  Participation Level:  Active  Participation Quality:  Appropriate and Attentive  Affect:  Appropriate  Cognitive:  Alert and Oriented  Insight:  Good  Engagement in Group:  Engaged and Supportive  Modes of Intervention:  Support  Summary of Progress/Problems: patient states that he plans to stay on his meds once he is discharged.  He also plans to go back to "his old work habits."    Zipporah Plants 01/17/2014, 11:00 AM

## 2014-01-17 NOTE — BHH Suicide Risk Assessment (Signed)
Suicide Risk Assessment  Discharge Assessment     Demographic Factors:  Male and Age 66 or older  Total Time spent with patient: 45 minutes  Psychiatric Specialty Exam:     Blood pressure 152/87, pulse 58, temperature 98.5 F (36.9 C), temperature source Oral, resp. rate 17, height 5' 9.5" (1.765 m), weight 103.42 kg (228 lb).Body mass index is 33.2 kg/(m^2).  General Appearance: Fairly Groomed  Engineer, water::  Fair  Speech:  Clear and Coherent and Slow  Volume:  Decreased  Mood:  Euthymic  Affect:  Appropriate  Thought Process:  Coherent and Goal Directed  Orientation:  Full (Time, Place, and Person)  Thought Content:  relapse prevention plan  Suicidal Thoughts:  No  Homicidal Thoughts:  No  Memory:  Immediate;   Fair Recent;   Fair Remote;   Fair  Judgement:  Fair  Insight:  Present  Psychomotor Activity:  Normal  Concentration:  Fair  Recall:  [employer] of Knowledge:NA  Language: Fair  Akathisia:  No  Handed:    AIMS (if indicated):     Assets:  Desire for Improvement Social Support  Sleep:  Number of Hours: 6    Musculoskeletal: Strength & Muscle Tone: within normal limits Gait & Station: normal Patient leans: N/A   Mental Status Per Nursing Assessment::   On Admission:  NA  Current Mental Status by Physician: In full contact with reality. There are no suicidal ideas, plans or intent. There are no active S/S of withdrawal. He is willing and motivated to work on long term abstinence   Loss Factors: Decline in physical health  Historical Factors: NA  Risk Reduction Factors:   wants to do better, wants to resume work  Continued Clinical Symptoms:  Depression:   Comorbid alcohol abuse/dependence Alcohol/Substance Abuse/Dependencies  Cognitive Features That Contribute To Risk:  Closed-mindedness Polarized thinking Thought constriction (tunnel vision)    Suicide Risk:  Minimal: No identifiable suicidal ideation.  Patients presenting with no risk  factors but with morbid ruminations; may be classified as minimal risk based on the severity of the depressive symptoms  Discharge Diagnoses:   AXIS I:  Alcohol Dependence, Major Depressive Disorder, GAD AXIS II:  Deferred AXIS III:   Past Medical History  Diagnosis Date  . Arthritis   . Gout   . Alcohol abuse   . Hypertension   . Hyperthyroidism   . Diabetes mellitus without complication   . GERD (gastroesophageal reflux disease)    AXIS IV:  other psychosocial or environmental problems AXIS V:  61-70 mild symptoms  Plan Of Care/Follow-up recommendations:  Activity:  as tolerated Diet:  regular Follow up outpatient basis Is patient on multiple antipsychotic therapies at discharge:  No   Has Patient had three or more failed trials of antipsychotic monotherapy by history:  No  Recommended Plan for Multiple Antipsychotic Therapies: NA    Jelani Trueba A 01/17/2014, 11:22 AM

## 2014-01-17 NOTE — Progress Notes (Signed)
Discharge Note: Discharge instructions/prescriptions/medication samples given to patient. Patient verbalized understanding of discharge instructions and prescriptions. Returned belongings to patient. Denies SI/HI/AVH. Patient d/c without incident to the lobby and transported home by friend.

## 2014-01-17 NOTE — Discharge Summary (Signed)
Physician Discharge Summary Note  Patient:  Kevin [name] is an 68 y.o., male MRN:  [id number] DOB:  06/07/1946 Patient phone:  [phone] (home)  Patient address:   [address] [place] [address],  Total Time spent with patient: Greater than 30 minutes  Date of Admission:  01/03/2014  Date of Discharge: 12/17/13  Reason for Admission:  Alcohol detox  Discharge Diagnoses: Principal Problem:   Alcohol dependence Active Problems:   Alcohol withdrawal   Generalized anxiety disorder   Substance induced mood disorder   Psychiatric Specialty Exam: Physical Exam  Constitutional: He is oriented to person, place, and time. He appears well-developed.  HENT:  Head: Normocephalic.  Eyes: Pupils are equal, round, and reactive to light.  Neck: Normal range of motion.  Cardiovascular: Normal rate.   Respiratory: Effort normal.  GI: Soft.  Genitourinary:  Denies any issues in this area  Musculoskeletal: Normal range of motion.  Neurological: He is alert and oriented to person, place, and time.  Skin: Skin is warm and dry.  Psychiatric: His speech is normal and behavior is normal. Judgment and thought content normal. His mood appears not anxious. His affect is not angry, not blunt, not labile and not inappropriate. Cognition and memory are normal. He does not exhibit a depressed mood.    Review of Systems  Constitutional: Negative.   HENT: Negative.   Eyes: Negative.   Respiratory: Negative.   Cardiovascular: Negative.   Gastrointestinal: Negative.   Genitourinary: Negative.   Musculoskeletal: Negative.   Skin: Negative.   Neurological: Negative.   Endo/Heme/Allergies: Negative.   Psychiatric/Behavioral: Positive for depression (Stabilized with medication prior to discharge) and substance abuse (Alcoholism, Hx). Negative for suicidal ideas, hallucinations and memory loss. The patient is nervous/anxious (Stabilized with medication prior to discharge) and has insomnia  (Stabilized with medication prior to discharge).     Blood pressure 152/87, pulse 58, temperature 98.5 F (36.9 C), temperature source Oral, resp. rate 17, height 5' 9.5" (1.765 m), weight 103.42 kg (228 lb).Body mass index is 33.2 kg/(m^2).  General Appearance: Casual and Fairly Groomed  Engineer, water::  Good  Speech:  Clear and Coherent  Volume:  Normal  Mood:  Euthymic  Affect:  Appropriate and Congruent  Thought Process:  Coherent and Goal Directed  Orientation:  Full (Time, Place, and Person)  Thought Content:  Denies any hallucinations, delusions, paranoia  Suicidal Thoughts:  No  Homicidal Thoughts:  No  Memory:  Immediate;   Good Recent;   Good Remote;   Good  Judgement:  Good  Insight:  Present  Psychomotor Activity:  Normal  Concentration:  Good  Recall:  Good  Fund of Knowledge:Fair  Language: Good  Akathisia:  No  Handed:  Right  AIMS (if indicated):     Assets:  Desire for Improvement  Sleep:  Number of Hours: 6    Past Psychiatric History: Diagnosis:  Hospitalizations:  Outpatient Care:  Substance Abuse Care:  Self-Mutilation:  Suicidal Attempts:  Violent Behaviors:   Musculoskeletal: Strength & Muscle Tone: within normal limits Gait & Station: normal Patient leans: N/A  DSM5: Schizophrenia Disorders: NA Obsessive-Compulsive Disorders:  NA Trauma-Stressor Disorders:  NA Substance/Addictive Disorders:  Alcohol Related Disorder - Severe (303.90) Depressive Disorders:  Substance induced mood disorder, Generalized anxiety disorder  Axis Diagnosis:   AXIS I:  Alcohol related disorder, severe, Substance induced mood disorder, Generalized anxiety disorder AXIS II:  Deferred AXIS III:   Past Medical History  Diagnosis Date  . Arthritis   .  Gout   . Alcohol abuse   . Hypertension   . Hyperthyroidism   . Diabetes mellitus without complication   . GERD (gastroesophageal reflux disease)    AXIS IV:  Alcoholism, chronic AXIS V:  63  Level of Care:   OP  Hospital Course:  This is a 68 year old African-American male. Admitted to [facility] from the [facility] ED with a request for opioid detox. Patient reports, "A friend carried me over to the [facility] ED yesterday because I was having anxiety attacks.I don't know what is causing it. I know that I have depressed all my life. I never participated in weddings and or any other occasions because such activities make my anxiety go bad. My depression gets the best of me. I buy pills of the streets to numb and calm me". Mr. Bouwman say he would try something for depression. Reports being sober for 27 years.  Jentry was admitted to the hospital with blood alcohol level of 59, positive opiates and benzodiazepine on his UDS reports. He was requesting detoxification treatment protocols. He was also complaining of increased anxiety levels as well as depression needing mood stabilization. Lathon was then started on a brief Librium/clonidine detox protocols. He also was ordered and received Citalopram 20 mg daily for depression, Gabapentin 200 mg three times daily for substance withdrawal syndrome, Hydroxyzine 25 mg three times daily for anxiety and Trazodone 100 mg Q bedtime for sleep. Beulah was resumed on all his pertinent home medications for his other health issues. He tolerated his treatment regimen without any significant advance effects reported.  Mr. Charters has completed detox, and mood stabilized. This is evidenced by his daily reports of improved mood and absence of withdrawal symptoms. He is currently being discharged to follow-up care for medication management at the [facility] behavioral outpatient clinic in [place], [place]. He is provided with all the pertinent information needed to make this appointment. Kristjan also received 4 days worth supply samples of his [facility] discharge medications. He left [facility] with all personal belongings in no apparent distress. Transportation per patient  arrangement.  Consults:  psychiatry  Significant Diagnostic Studies:  labs: Reviewed current lab findings, no changes  Discharge Vitals:   Blood pressure 152/87, pulse 58, temperature 98.5 F (36.9 C), temperature source Oral, resp. rate 17, height 5' 9.5" (1.765 m), weight 103.42 kg (228 lb). Body mass index is 33.2 kg/(m^2). Lab Results:   Results for orders placed during the hospital encounter of 01/03/14 (from the past 72 hour(s))  GLUCOSE, CAPILLARY     Status: None   Collection Time    01/15/14  6:09 AM      Result Value Ref Range   Glucose-Capillary 96  70 - 99 mg/dL  GLUCOSE, CAPILLARY     Status: Abnormal   Collection Time    01/16/14  6:32 AM      Result Value Ref Range   Glucose-Capillary 110 (*) 70 - 99 mg/dL  GLUCOSE, CAPILLARY     Status: Abnormal   Collection Time    01/17/14  6:11 AM      Result Value Ref Range   Glucose-Capillary 104 (*) 70 - 99 mg/dL    Physical Findings: AIMS: Facial and Oral Movements Muscles of Facial Expression: None, normal Lips and Perioral Area: None, normal Jaw: None, normal Tongue: None, normal,Extremity Movements Upper (arms, wrists, hands, fingers): None, normal Lower (legs, knees, ankles, toes): None, normal, Trunk Movements Neck, shoulders, hips: None, normal, Overall Severity Severity  of abnormal movements (highest score from questions above): None, normal Incapacitation due to abnormal movements: None, normal Patient's awareness of abnormal movements (rate only patient's report): No Awareness, Dental Status Current problems with teeth and/or dentures?: No Does patient usually wear dentures?: No  CIWA:  CIWA-Ar Total: 0 COWS:  COWS Total Score: 1  Psychiatric Specialty Exam: See Psychiatric Specialty Exam and Suicide Risk Assessment completed by Attending Physician prior to discharge.  Discharge destination:  Home  Is patient on multiple antipsychotic therapies at discharge:  No   Has Patient had three or more failed  trials of antipsychotic monotherapy by history:  No  Recommended Plan for Multiple Antipsychotic Therapies: NA    Medication List    STOP taking these medications       cloNIDine 0.1 MG tablet  Commonly known as:  CATAPRES     zolpidem 5 MG tablet  Commonly known as:  AMBIEN      TAKE these medications     Indication   citalopram 20 MG tablet  Commonly known as:  CELEXA  Take 1 tablet (20 mg total) by mouth daily. For depression   Indication:  Depression     colchicine 0.6 MG tablet  Take 1 tablet (0.6 mg total) by mouth daily. For gout arthritis   Indication:  Acute Joint Inflammation in Gout     gabapentin 100 MG capsule  Commonly known as:  NEURONTIN  Take 2 capsules (200 mg total) by mouth 3 (three) times daily. For substance withdrawal syndrome, anxiety/pain   Indication:  Alcohol Withdrawal Syndrome, Anxiety/pain control     hydrOXYzine 25 MG tablet  Commonly known as:  ATARAX/VISTARIL  Take 1 tablet (25 mg total) by mouth 3 (three) times daily. For anxiety/tension   Indication:  Anxiety associated with Organic Disease, Tension     lansoprazole 30 MG capsule  Commonly known as:  PREVACID  Take 1 capsule (30 mg total) by mouth daily. For acid reflux   Indication:  Gastroesophageal Reflux Disease     levothyroxine 100 MCG tablet  Commonly known as:  SYNTHROID, LEVOTHROID  Take 1 tablet (100 mcg total) by mouth daily. For thyroid hormone replacement   Indication:  Underactive Thyroid     metFORMIN 1000 MG (MOD) 24 hr tablet  Commonly known as:  GLUMETZA  Take 1 tablet (1,000 mg total) by mouth daily with breakfast. For diabetes   Indication:  Diabetes mellitus     Olmesartan-Amlodipine-HCTZ 40-10-12.5 MG Tabs  Commonly known as:  TRIBENZOR  Take 1 tablet by mouth daily. For hypertension   Indication:  Hypertension     traZODone 100 MG tablet  Commonly known as:  DESYREL  Take 1 tablet (100 mg total) by mouth at bedtime. For sleep   Indication:  Trouble  Sleeping       Follow-up Information   Follow up with [facility] On 01/31/2014. (Appt. at 9:30AM with Dr. Harrington Challenger for hospital follow-up/medication management. Please bring completed "new patient packet" ID, and Medicare card. )    Contact information:   [address] [place], [place] [address] Phone: [phone] Fax: x     Follow-up recommendations:  Activity:  As tolerated Diet: As recommended by your primary care doctor. Keep all scheduled follow-up appointments as recommended. Continue to work your relapse prevention plan Comments: Take all your medications as prescribed by your mental healthcare provider. Report any adverse effects and or reactions from your medicines to your outpatient provider promptly. Patient is instructed and  cautioned to not engage in alcohol and or illegal drug use while on prescription medicines. In the event of worsening symptoms, patient is instructed to call the crisis hotline, 911 and or go to the nearest ED for appropriate evaluation and treatment of symptoms. Follow-up with your primary care provider for your other medical issues, concerns and or health care needs.    Total Discharge Time:  Greater than 30 minutes.  Signed: Encarnacion Slates, PMHNP-BC 01/17/2014, 11:46 AM Agree with assessment and plan Nicholaus Bloom. M.D.

## 2014-01-17 NOTE — Progress Notes (Signed)
[facility] Adult Case Management Discharge Plan :  Will you be returning to the same living situation after discharge: Yes,  home At discharge, do you have transportation home?:Yes,  friend (after lunch) Do you have the ability to pay for your medications:Yes,  Medicare  Release of information consent forms completed and submitted to Medical Records by CSW.   Patient to Follow up at: Follow-up Information   Follow up with [facility] On 01/31/2014. (Appt. at 9:30AM with Dr. Harrington Challenger for hospital follow-up/medication management. Please bring completed "new patient packet" ID, and Medicare card. )    Contact information:   [address] [place], [place] [address] Phone: [phone] Fax: x      Patient denies SI/HI:   Yes,  during admission, group, and self report.     Safety Planning and Suicide Prevention discussed:  Yes,  SPE not required for this pt. SPI pamphlet provided to pt and he was encouraged to share information with support network, ask questions, and talk about any concerns relating to SPE.  Smart, Kenyonna Micek LCSWA  01/17/2014, 10:31 AM

## 2014-01-22 NOTE — Progress Notes (Signed)
Patient Discharge Instructions:  After Visit Summary (AVS):   Faxed to:  01/22/14 Discharge Summary Note:   Faxed to:  01/22/14 Psychiatric Admission Assessment Note:   Faxed to:  01/22/14 Suicide Risk Assessment - Discharge Assessment:   Faxed to:  01/22/14 Faxed/Sent to the Next Level Care provider:  01/22/14 Next Level Care Provider Has Access to the EMR, 01/22/14 Faxed to [facility] @ [phone] Records provided to [facility] via CHL/Epic access.  Patsey Berthold, 01/22/2014, 3:50 PM

## 2014-01-31 ENCOUNTER — Encounter (HOSPITAL_COMMUNITY): Payer: Self-pay | Admitting: Psychiatry

## 2014-01-31 ENCOUNTER — Ambulatory Visit (INDEPENDENT_AMBULATORY_CARE_PROVIDER_SITE_OTHER): Payer: Medicare Other | Admitting: Psychiatry

## 2014-01-31 VITALS — BP 120/62 | Ht 69.0 in | Wt 224.0 lb

## 2014-01-31 DIAGNOSIS — F1010 Alcohol abuse, uncomplicated: Secondary | ICD-10-CM

## 2014-01-31 DIAGNOSIS — F1994 Other psychoactive substance use, unspecified with psychoactive substance-induced mood disorder: Secondary | ICD-10-CM

## 2014-01-31 DIAGNOSIS — F332 Major depressive disorder, recurrent severe without psychotic features: Secondary | ICD-10-CM

## 2014-01-31 MED ORDER — ESCITALOPRAM OXALATE 20 MG PO TABS: 20.0000 mg | ORAL_TABLET | Freq: Every day | ORAL | Status: DC

## 2014-01-31 MED ORDER — GABAPENTIN 100 MG PO CAPS: 200.0000 mg | ORAL_CAPSULE | Freq: Three times a day (TID) | ORAL | Status: DC

## 2014-01-31 NOTE — Progress Notes (Signed)
Psychiatric Assessment Adult  Patient Identification:  Kevin [name] Date of Evaluation:  01/31/2014 Chief Complaint: "I was depressed and drinking a lot"  History of Chief Complaint:   Chief Complaint  Patient presents with  . Alcohol Problem  . Anxiety  . Depression  . Establish Care    Alcohol Problem  Anxiety Symptoms include nervous/anxious behavior.     this patient is a 68 year old single black male who lives alone in [place]. He has no children. 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  The patient was referred by the behavioral health hospital where he was hospitalized from February 4 through the 18th. He was there to detox off alcohol, benzodiazepines and narcotic pills. He also has a history of depression. This is his third hospitalization, having been previously hospitalized in 2012 and 2008 for the same issues. He's never really attended much counseling or AA even all this was recommended in the past.  The patient states that his depression began when he was much younger although he doesn't have any particular reason to feel depressed. He never sought out any treatment while he worked. He retired about 10 years ago and then began to drink more heavily to deal with his low mood and anxiety. Over the last few weeks before admission he was drinking about a fifth of liquor a day. He was not eating and was very depressed. He admits that he use some hydrocodone because of the pain in his hand due to arthritis but these were not prescribed to him. His drug screen was also positive for benzodiazepines but he claims he did not use these.  While in the hospital the patient went through a detox protocol. He was started on Celexa and Neurontin. He is also on hydroxyzine but stopped it because it didn't help. He also stopped trazodone because it made him unable to walk. He states that he would like to switch his antidepressant to  Lexapro because one of his friends has had a great result with it and I told him we could give it a try. He has not drank since he got out of the hospital or use any drugs. He is spending time with his family. 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 He's never been suicidal or had psychotic symptoms. He is sleeping well. Review of Systems  Constitutional: Negative.   HENT: Negative.   Eyes: Negative.   Respiratory: Negative.   Cardiovascular: Negative.   Gastrointestinal: Negative.   Endocrine: Negative.   Genitourinary: Negative.   Musculoskeletal: Positive for arthralgias and myalgias.  Allergic/Immunologic: Negative.   Neurological: Negative.   Hematological: Negative.   Psychiatric/Behavioral: Positive for dysphoric mood. The patient is nervous/anxious.    Physical Exam  Depressive Symptoms: depressed mood, anhedonia, psychomotor retardation, hopelessness, anxiety,  (Hypo) Manic Symptoms:   Elevated Mood:  No Irritable Mood:  No Grandiosity:  No Distractibility:  No Labiality of Mood:  No Delusions:  No Hallucinations:  No Impulsivity:  No Sexually Inappropriate Behavior:  No Financial Extravagance:  No Flight of Ideas:  No  Anxiety Symptoms: Excessive Worry:  Yes Panic Symptoms:  Yes Agoraphobia:  No Obsessive Compulsive: No  Symptoms: None, Specific Phobias:  No Social Anxiety:  No  Psychotic Symptoms:  Hallucinations: No None Delusions:  No Paranoia:  No   Ideas of Reference:  No  PTSD Symptoms: Ever had a traumatic exposure:  No Had a traumatic exposure in the last month:  No Re-experiencing: No None Hypervigilance:  No Hyperarousal: No None Avoidance: No None  Traumatic Brain Injury: No  Past Psychiatric History: Diagnosis: Alcohol dependence, depression   Hospitalizations: Behavioral health hospital last month and in 2008 and 2012   Outpatient Care: None   Substance Abuse Care: Same as hospitalizations    Self-Mutilation: None   Suicidal Attempts: None   Violent Behaviors: None    Past Medical History:   Past Medical History  Diagnosis Date  . Arthritis   . Gout   . Alcohol abuse   . Hypertension   . Hyperthyroidism   . Diabetes mellitus without complication   . GERD (gastroesophageal reflux disease)   . Diabetes mellitus, type II    History of Loss of Consciousness:  No Seizure History:  No Cardiac History:  No Allergies:   Allergies  Allergen Reactions  . Trazodone And Nefazodone     Unable to walk   Current Medications:  Current Outpatient Prescriptions  Medication Sig Dispense Refill  . colchicine 0.6 MG tablet Take 1 tablet (0.6 mg total) by mouth daily. For gout arthritis      . escitalopram (LEXAPRO) 20 MG tablet Take 1 tablet (20 mg total) by mouth daily.  30 tablet  2  . gabapentin (NEURONTIN) 100 MG capsule Take 2 capsules (200 mg total) by mouth 3 (three) times daily. For substance withdrawal syndrome, anxiety/pain  180 capsule  2  . lansoprazole (PREVACID) 30 MG capsule Take 1 capsule (30 mg total) by mouth daily. For acid reflux      . levothyroxine (SYNTHROID, LEVOTHROID) 100 MCG tablet Take 1 tablet (100 mcg total) by mouth daily. For thyroid hormone replacement      . metFORMIN (GLUMETZA) 1000 MG (MOD) 24 hr tablet Take 1 tablet (1,000 mg total) by mouth daily with breakfast. For diabetes      . Olmesartan-Amlodipine-HCTZ (TRIBENZOR) 40-10-12.5 MG TABS Take 1 tablet by mouth daily. For hypertension  30 tablet     No current facility-administered medications for this visit.    Previous Psychotropic Medications:  Medication Dose                          Substance Abuse History in the last 12 months: Substance Age of 1st Use Last Use Amount Specific Type  Nicotine      Alcohol    was drinking a fifth of liquor each day but stopped during hospitalization last month    Cannabis      Opiates    was using hydrocodone occasionally but stopped during  hospitalization last month    Cocaine      Methamphetamines      LSD      Ecstasy      Benzodiazepines    this showed up on his drug screen but he denies using it    Caffeine      Inhalants      Others:                          Medical Consequences of Substance Abuse: Hospitalization needed for detox Legal Consequences of Substance Abuse: 1 DUI 35 years of  Family Consequences of Substance Abuse: Unknown Blackouts:  Yes DT's:  No Withdrawal Symptoms:  No None  Social History: Current Place of Residence: [place] of Birth: [place] Members 2 living sisters, several  nieces and nephews Marital Status:  Single Children: none   Relationships: Has a girlfriend Education:  HS Soil scientist Problems/Performance:  Religious Beliefs/Practices: Christian History of Abuse: none Pensions consultant; 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 Legal History: 1 DUI 35 years ago Hobbies/Interests: Watching sports on TV  Family History:   Family History  Problem Relation Age of Onset  . Alcohol abuse Paternal Aunt   . Drug abuse Paternal Uncle   . Alcohol abuse Paternal Uncle     Mental Status Examination/Evaluation: Objective:  Appearance: Casual and Fairly Groomed  Eye Contact::  Good  Speech:  Clear and Coherent  Volume:  Normal  Mood:  Good today   Affect:  Full Range  Thought Process:  Goal Directed  Orientation:  Full (Time, Place, and Person)  Thought Content:  WDL  Suicidal Thoughts:  No  Homicidal Thoughts:  No  Judgement:  Fair  Insight:  Fair  Psychomotor Activity:  Normal  Akathisia:  No  Handed:  Right  AIMS (if indicated):    Assets:  Communication Skills Desire for Improvement    Laboratory/X-Ray Psychological Evaluation(s)   Reviewed from hospital stay      Assessment:  Axis I: Alcohol Abuse and Major Depression, Recurrent severe  AXIS I Alcohol Abuse and Major Depression, Recurrent  severe  AXIS II Deferred  AXIS III Past Medical History  Diagnosis Date  . Arthritis   . Gout   . Alcohol abuse   . Hypertension   . Hyperthyroidism   . Diabetes mellitus without complication   . GERD (gastroesophageal reflux disease)   . Diabetes mellitus, type II      AXIS IV other psychosocial or environmental problems  AXIS V 61-70 mild symptoms   Treatment Plan/Recommendations:  Plan of Care: Medication management   Laboratory:  Psychotherapy: The patient will be assigned a counselor here   Medications: He will continue Neurontin 100-200 mg 3 times a day. At his request we will change from Celexa to Lexapro 20 mg every morning for mood stabilization   Routine PRN Medications:  No  Consultations:   Safety Concerns:    Other: He will return in [place], [place], MD 3/4/201510:23 AM

## 2014-02-06 ENCOUNTER — Telehealth (HOSPITAL_COMMUNITY): Payer: Self-pay | Admitting: *Deleted

## 2014-02-06 NOTE — Telephone Encounter (Signed)
Incorrect, as pt is not on both medications noted

## 2014-02-14 ENCOUNTER — Encounter (HOSPITAL_COMMUNITY): Payer: Self-pay | Admitting: Psychiatry

## 2014-02-14 ENCOUNTER — Ambulatory Visit (INDEPENDENT_AMBULATORY_CARE_PROVIDER_SITE_OTHER): Payer: Medicare Other | Admitting: Psychiatry

## 2014-02-14 DIAGNOSIS — F332 Major depressive disorder, recurrent severe without psychotic features: Secondary | ICD-10-CM

## 2014-02-14 DIAGNOSIS — F1010 Alcohol abuse, uncomplicated: Secondary | ICD-10-CM

## 2014-02-14 DIAGNOSIS — F411 Generalized anxiety disorder: Secondary | ICD-10-CM

## 2014-02-14 DIAGNOSIS — F419 Anxiety disorder, unspecified: Secondary | ICD-10-CM

## 2014-02-15 NOTE — Patient Instructions (Signed)
Discussed orally 

## 2014-02-15 NOTE — Progress Notes (Signed)
Patient:   Kevin [name]   DOB:   Dec 28, 1945  MR Number:  [id number]  Location:  [address], [place], [place] [address]  Date of Service:   Wednesday 02/13/2014  Start Time:   3:00 PM End Time:   3:50 PM  Provider/Observer:  Maurice Small, MSW, LCSW   Billing Code/Service:  [id number]  Chief Complaint:     Chief Complaint  Patient presents with  . Depression  . Anxiety    Reason for Service:  Patient is referred for services by psychiatrist Dr. Harrington Challenger to improve coping skills as patient suffers from symptoms of anxiety, depression, and alcohol abuse. Patient was hospitalized at the [facility] in January 2015 for treatment and reports two previous psychiatric hospitalizations due to to depression and alcohol abuse. Patient reports he was drinking about a fifth of liquor per day. He states he thinks he's experienced depression and anxiety since childhood. He states having nothing to be depressed about but reports a pattern of being nervous being around people especially when women are around.  Current Status:  Patient reports decreased appetite, nervousness, and having no motivation, no interest, and no energy when he is depressed.  Reliability of Information: Patient data from patient and medical record.  Behavioral Observation: Kevin [name]  presents as a 68 y.o.-year-old Right -handed  African American Male who appeared his stated age. His dress was appropriate and he was Fairly Groomed.  His manners were appropriate to the situation.  There were not any physical disabilities noted.  He displayed an appropriate level of cooperation and motivation.    Interactions:    Active   Attention:   normal  Memory:   normal  Visuo-spatial:   not examined  Speech (Volume):  normal  Speech:   normal pitch and normal volume  Thought Process:  Coherent and Relevant  Though Content:  WNL  Orientation:   person, place, time/date, situation, day of week, month of year and  year  Judgment:   Good  Planning:   Good  Affect:    Appropriate  Mood:    Anxious and Depressed  Insight:   Fair  Intelligence:     Marital Status/Living: Patient was born and raised in [place], [place]. He is third of 4 siblings. His parents separated when he was a child. He reports a good childhood. Patient has never been married and has no children. He resides alone in [place], [place] and reports being very close with his siblings, nieces, and nephews. He states seeing family often and having good support from 2 friends.  Current Employment: Retired. 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  Substance Use:  There is a documented history of alcohol abuse confirmed by the patient.   Education:   HS Graduate  Medical History:   Past Medical History  Diagnosis Date  . Arthritis   . Gout   . Alcohol abuse   . Hypertension   . Hyperthyroidism   . Diabetes mellitus without complication   . GERD (gastroesophageal reflux disease)   . Diabetes mellitus, type II     Sexual History:   History  Sexual Activity  . Sexual Activity: Yes    Abuse/Trauma History: Denies  Psychiatric History:  Patient reports 3 psychiatric hospitalizations due to alcohol abuse and depression. Patient reports no previous involvement in outpatient psychotherapy. Patient  currently is seeing psychiatrist Dr. Harrington Challenger for medication management.  Family Med/Psych History:  Family History  Problem Relation Age of Onset  . Alcohol abuse Paternal Aunt   . Drug abuse Paternal Uncle   . Alcohol abuse Paternal Uncle     Risk of Suicide/Violence: Patient denies past and  current suicidal and homicidal ideations. He reports no history of self injurious behaviors, aggression, or violence.  Impression/DX:  The patient presents with a long-standing  history of recurrent periods of depression along with alcohol abuse. He also reports experiencing anxiety since childhood. Patient has had 3 psychiatric hospitalizations. His current symptoms include     decreased appetite and nervousness. He reports feeling less depressed since discharge but reports having no motivation, no interest, and no energy when he is depressed. Diagnoses: Major depressive disorder, recurrent, anxiety disorder, rule out GAD, alcohol abuse   Disposition/Plan:  The patient attends the assessment appointment today. Confidentiality and limits are discussed. The patient agrees to return for an appointment in 2 weeks for continuing assessment and treatment planning. The patient agrees to call this practice, call 911, or have someone take him to the emergency room should symptoms worsen. Patient continues to see Dr. Harrington Challenger for medication management. Patient also will consider attending AA.  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

## 2014-02-26 DIAGNOSIS — I10 Essential (primary) hypertension: Secondary | ICD-10-CM | POA: Diagnosis not present

## 2014-02-26 DIAGNOSIS — E7800 Pure hypercholesterolemia, unspecified: Secondary | ICD-10-CM | POA: Diagnosis not present

## 2014-02-26 DIAGNOSIS — E119 Type 2 diabetes mellitus without complications: Secondary | ICD-10-CM | POA: Diagnosis not present

## 2014-02-28 ENCOUNTER — Encounter (HOSPITAL_COMMUNITY): Payer: Self-pay | Admitting: Psychiatry

## 2014-02-28 ENCOUNTER — Ambulatory Visit (INDEPENDENT_AMBULATORY_CARE_PROVIDER_SITE_OTHER): Payer: Medicare Other | Admitting: Psychiatry

## 2014-02-28 VITALS — BP 120/70 | Ht 69.0 in | Wt 226.0 lb

## 2014-02-28 DIAGNOSIS — F332 Major depressive disorder, recurrent severe without psychotic features: Secondary | ICD-10-CM

## 2014-02-28 DIAGNOSIS — F1010 Alcohol abuse, uncomplicated: Secondary | ICD-10-CM

## 2014-02-28 MED ORDER — GABAPENTIN 100 MG PO CAPS: 200.0000 mg | ORAL_CAPSULE | Freq: Three times a day (TID) | ORAL | Status: DC

## 2014-02-28 MED ORDER — ESCITALOPRAM OXALATE 20 MG PO TABS: 20.0000 mg | ORAL_TABLET | Freq: Every day | ORAL | Status: DC

## 2014-02-28 NOTE — Progress Notes (Signed)
Patient ID: Kevin [name], male   DOB: 04-13-1946, 68 y.o.   MRN: [id number]  Psychiatric Assessment Adult  Patient Identification:  Kevin [name] Date of Evaluation:  02/28/2014 Chief Complaint: "I was depressed and drinking a lot"  History of Chief Complaint:   Chief Complaint  Patient presents with  . Anxiety  . Depression  . Follow-up    Anxiety Symptoms include nervous/anxious behavior.    Alcohol Problem   this patient is a 68 year old single black male who lives alone in [place]. He has no children. 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  The patient was referred by the behavioral health hospital where he was hospitalized from February 4 through the 18th. He was there to detox off alcohol, benzodiazepines and narcotic pills. He also has a history of depression. This is his third hospitalization, having been previously hospitalized in 2012 and 2008 for the same issues. He's never really attended much counseling or AA even all this was recommended in the past.  The patient states that his depression began when he was much younger although he doesn't have any particular reason to feel depressed. He never sought out any treatment while he worked. He retired about 10 years ago and then began to drink more heavily to deal with his low mood and anxiety. Over the last few weeks before admission he was drinking about a fifth of liquor a day. He was not eating and was very depressed. He admits that he use some hydrocodone because of the pain in his hand due to arthritis but these were not prescribed to him. His drug screen was also positive for benzodiazepines but he claims he did not use these.  While in the hospital the patient went through a detox protocol. He was started on Celexa and Neurontin. He is also on hydroxyzine but stopped it because it didn't help. He also stopped trazodone because it made him unable to walk. He states  that he would like to switch his antidepressant to Lexapro because one of his friends has had a great result with it and I told him we could give it a try. He has not drank since he got out of the hospital or use any drugs. He is spending time with his family. 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 He's never been suicidal or had psychotic symptoms. He is sleeping well.  The patient returns after one month. He is doing better. He is not drinking at all. His mood is been stable. He is getting out spending more time with friends. He is sleeping well at night. He feels that the Lexapro and Neurontin have been helpful Review of Systems  Constitutional: Negative.   HENT: Negative.   Eyes: Negative.   Respiratory: Negative.   Cardiovascular: Negative.   Gastrointestinal: Negative.   Endocrine: Negative.   Genitourinary: Negative.   Musculoskeletal: Positive for arthralgias and myalgias.  Allergic/Immunologic: Negative.   Neurological: Negative.   Hematological: Negative.   Psychiatric/Behavioral: Positive for dysphoric mood. The patient is nervous/anxious.    Physical Exam  Depressive Symptoms: depressed mood, anhedonia, psychomotor retardation, hopelessness, anxiety,  (Hypo) Manic Symptoms:   Elevated Mood:  No Irritable Mood:  No Grandiosity:  No Distractibility:  No Labiality of Mood:  No Delusions:  No Hallucinations:  No Impulsivity:  No Sexually Inappropriate Behavior:  No Financial Extravagance:  No Flight of Ideas:  No  Anxiety Symptoms: Excessive Worry:  Yes Panic Symptoms:  Yes Agoraphobia:  No Obsessive Compulsive: No  Symptoms: None, Specific Phobias:  No Social Anxiety:  No  Psychotic Symptoms:  Hallucinations: No None Delusions:  No Paranoia:  No   Ideas of Reference:  No  PTSD Symptoms: Ever had a traumatic exposure:  No Had a traumatic exposure in the last month:  No Re-experiencing: No None Hypervigilance:   No Hyperarousal: No None Avoidance: No None  Traumatic Brain Injury: No  Past Psychiatric History: Diagnosis: Alcohol dependence, depression   Hospitalizations: Behavioral health hospital last month and in 2008 and 2012   Outpatient Care: None   Substance Abuse Care: Same as hospitalizations   Self-Mutilation: None   Suicidal Attempts: None   Violent Behaviors: None    Past Medical History:   Past Medical History  Diagnosis Date  . Arthritis   . Gout   . Alcohol abuse   . Hypertension   . Hyperthyroidism   . Diabetes mellitus without complication   . GERD (gastroesophageal reflux disease)   . Diabetes mellitus, type II    History of Loss of Consciousness:  No Seizure History:  No Cardiac History:  No Allergies:   Allergies  Allergen Reactions  . Trazodone And Nefazodone     Unable to walk   Current Medications:  Current Outpatient Prescriptions  Medication Sig Dispense Refill  . colchicine 0.6 MG tablet Take 1 tablet (0.6 mg total) by mouth daily. For gout arthritis      . escitalopram (LEXAPRO) 20 MG tablet Take 1 tablet (20 mg total) by mouth daily.  30 tablet  2  . gabapentin (NEURONTIN) 100 MG capsule Take 2 capsules (200 mg total) by mouth 3 (three) times daily. For substance withdrawal syndrome, anxiety/pain  180 capsule  2  . lansoprazole (PREVACID) 30 MG capsule Take 1 capsule (30 mg total) by mouth daily. For acid reflux      . levothyroxine (SYNTHROID, LEVOTHROID) 100 MCG tablet Take 1 tablet (100 mcg total) by mouth daily. For thyroid hormone replacement      . metFORMIN (GLUMETZA) 1000 MG (MOD) 24 hr tablet Take 1 tablet (1,000 mg total) by mouth daily with breakfast. For diabetes      . Olmesartan-Amlodipine-HCTZ (TRIBENZOR) 40-10-12.5 MG TABS Take 1 tablet by mouth daily. For hypertension  30 tablet     No current facility-administered medications for this visit.    Previous Psychotropic Medications:  Medication Dose                           Substance Abuse History in the last 12 months: Substance Age of 1st Use Last Use Amount Specific Type  Nicotine      Alcohol    was drinking a fifth of liquor each day but stopped during hospitalization last month    Cannabis      Opiates    was using hydrocodone occasionally but stopped during hospitalization last month    Cocaine      Methamphetamines      LSD      Ecstasy      Benzodiazepines    this showed up on his drug screen but he denies using it    Caffeine      Inhalants      Others:  Medical Consequences of Substance Abuse: Hospitalization needed for detox Legal Consequences of Substance Abuse: 1 DUI 35 years of  Family Consequences of Substance Abuse: Unknown Blackouts:  Yes DT's:  No Withdrawal Symptoms:  No None  Social History: Current Place of Residence: [place] of Birth: [place] Family Members 2 living sisters, several nieces and nephews Marital Status:  Single Children: none   Relationships: Has a girlfriend Education:  HS Soil scientist Problems/Performance:  Religious Beliefs/Practices: Christian History of Abuse: none Pensions consultant; 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 Legal History: 1 DUI 35 years ago Hobbies/Interests: Watching sports on TV  Family History:   Family History  Problem Relation Age of Onset  . Alcohol abuse Paternal Aunt   . Drug abuse Paternal Uncle   . Alcohol abuse Paternal Uncle     Mental Status Examination/Evaluation: Objective:  Appearance: Casual and Fairly Groomed  Eye Contact::  Good  Speech:  Clear and Coherent  Volume:  Normal  Mood:  Good today   Affect:  Full Range  Thought Process:  Goal Directed  Orientation:  Full (Time, Place, and Person)  Thought Content:  WDL  Suicidal Thoughts:  No  Homicidal Thoughts:  No  Judgement:  Fair  Insight:  Fair  Psychomotor Activity:  Normal  Akathisia:  No  Handed:  Right   AIMS (if indicated):    Assets:  Communication Skills Desire for Improvement    Laboratory/X-Ray Psychological Evaluation(s)   Reviewed from hospital stay      Assessment:  Axis I: Alcohol Abuse and Major Depression, Recurrent severe  AXIS I Alcohol Abuse and Major Depression, Recurrent severe  AXIS II Deferred  AXIS III Past Medical History  Diagnosis Date  . Arthritis   . Gout   . Alcohol abuse   . Hypertension   . Hyperthyroidism   . Diabetes mellitus without complication   . GERD (gastroesophageal reflux disease)   . Diabetes mellitus, type II      AXIS IV other psychosocial or environmental problems  AXIS V 61-70 mild symptoms   Treatment Plan/Recommendations:  Plan of Care: Medication management   Laboratory:  Psychotherapy: The patient will be assigned a counselor here   Medications: He will continue Neurontin 100-200 mg 3 times a da y and Lexapro 20 mg every morning for mood stabilization   Routine PRN Medications:  No  Consultations:   Safety Concerns:    Other: He will return in 2 months    Levonne Spiller, MD 4/1/201511:49 AM

## 2014-03-01 ENCOUNTER — Telehealth (HOSPITAL_COMMUNITY): Payer: Self-pay | Admitting: *Deleted

## 2014-03-06 ENCOUNTER — Ambulatory Visit (INDEPENDENT_AMBULATORY_CARE_PROVIDER_SITE_OTHER): Payer: Medicare Other | Admitting: Psychiatry

## 2014-03-06 DIAGNOSIS — F332 Major depressive disorder, recurrent severe without psychotic features: Secondary | ICD-10-CM

## 2014-03-06 NOTE — Progress Notes (Signed)
   THERAPIST PROGRESS NOTE  Session Time: Tuesday 03/06/2014 2:05 PM - 2:55 PM  Participation Level: Active  Behavioral Response: CasualAlertEuthymic  Type of Therapy: Individual Therapy  Treatment Goals addressed: Improve daily routine/structure, use support system,   Interventions: Supportive    Summary: Kevin [name] is a 67 y.o. male who presents with symptoms of anxiety, depression, and alcohol abuse. Patient was hospitalized at the [facility] in January 2015 for treatment and reports two previous psychiatric hospitalizations due to  depression and alcohol abuse. Patient reports he was drinking about a fifth of liquor per day. He states he thinks he's experienced depression and anxiety since childhood. He states having nothing to be depressed about but reports a pattern of being nervous being around people  Since his last session 3 weeks ago, he reports significant improvement in mood and decreased anxiety. He rates depression and anxiety at 1 on 10 point scalee with 10 being severe. He has increased involvement in activity and reports staying outside of his home most of the day either being with friends or working in his yard. He has been more involved with family and has attended family functions including dinner and a birthday party. 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 He also plans to resume attending church this Sunday. Patient states feeling much better since abstaining from alcohol use. He states thinking he was using the alcohol to manage the depression and anxiety. He is taking medication as prescribed by psychiatrist Dr. Harrington Challenger and says it really helps Patient is pleased with his progress and verbalizes several reasons he plans to maintain abstinence from alcohol use including better health and being there for his family.   Suicidal/Homicidal: No  Therapist Response: Therapist works with patient to discuss his progress, self-care  efforts, involvement in activity, and use of support system. Therapist and patient also discuss reasons he plans to maintain abstinence from alcohol use. Therapist and patient agree that patient seems to be doing well and he needs to maintain consistent self-care efforts, involvement in activity, and connection with his support system. Therapist and patient also agree that patient will not pursue psychotherapy services at this time. Patient is encouraged to contact this practice should he need psychotherapy services in the future. He will continue to see psychiatrist Dr. Harrington Challenger for medication management.  Plan: Psychotherapy services will be discontinued at this time. Patient will continue to see psychiatrist Dr. Harrington Challenger for medication management.  Diagnosis: Axis I: Major depressive disorder    Axis II: No diagnosis    Kevin Biggs, LCSW 03/06/2014     Outpatient Therapist Discharge Summary  Kevin [name]    [birth year]   Admission Date: 02/14/2014   Discharge Date:  03/06/2014 Reason for Discharge:  Treatment completed Diagnosis:  Axis I:  Major depressive disorder, recurrent episode, severe, without mention of psychotic behavior   Kevin Steedley LCSW

## 2014-03-06 NOTE — Patient Instructions (Signed)
Discussed orally 

## 2014-04-25 ENCOUNTER — Telehealth (HOSPITAL_COMMUNITY): Payer: Self-pay | Admitting: *Deleted

## 2014-05-01 ENCOUNTER — Ambulatory Visit (INDEPENDENT_AMBULATORY_CARE_PROVIDER_SITE_OTHER): Payer: Medicare Other | Admitting: Psychiatry

## 2014-05-01 ENCOUNTER — Encounter (HOSPITAL_COMMUNITY): Payer: Self-pay | Admitting: Psychiatry

## 2014-05-01 VITALS — BP 110/60 | Ht 69.0 in | Wt 224.0 lb

## 2014-05-01 DIAGNOSIS — F1010 Alcohol abuse, uncomplicated: Secondary | ICD-10-CM

## 2014-05-01 DIAGNOSIS — F332 Major depressive disorder, recurrent severe without psychotic features: Secondary | ICD-10-CM

## 2014-05-01 MED ORDER — GABAPENTIN 100 MG PO CAPS: 200.0000 mg | ORAL_CAPSULE | Freq: Three times a day (TID) | ORAL | Status: DC

## 2014-05-01 MED ORDER — ESCITALOPRAM OXALATE 20 MG PO TABS: 20.0000 mg | ORAL_TABLET | Freq: Every day | ORAL | Status: DC

## 2014-05-01 NOTE — Progress Notes (Signed)
Patient ID: Kevin [name], male   DOB: [birth year], 68 y.o.   MRN: [id number] Patient ID: Kevin [name], male   DOB: 03-19-1946, 68 y.o.   MRN: [id number]  Psychiatric Assessment Adult  Patient Identification:  Kevin [name] Date of Evaluation:  05/01/2014 Chief Complaint: I'm doing much better  History of Chief Complaint:   Chief Complaint  Patient presents with  . Anxiety  . Depression  . Alcohol Problem  . Follow-up    Anxiety Symptoms include nervous/anxious behavior.    Alcohol Problem   this patient is a 68 year old single black male who lives alone in [place]. He has no children. 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  The patient was referred by the behavioral health hospital where he was hospitalized from February 4 through the 18th. He was there to detox off alcohol, benzodiazepines and narcotic pills. He also has a history of depression. This is his third hospitalization, having been previously hospitalized in 2012 and 2008 for the same issues. He's never really attended much counseling or AA even all this was recommended in the past.  The patient states that his depression began when he was much younger although he doesn't have any particular reason to feel depressed. He never sought out any treatment while he worked. He retired about 10 years ago and then began to drink more heavily to deal with his low mood and anxiety. Over the last few weeks before admission he was drinking about a fifth of liquor a day. He was not eating and was very depressed. He admits that he use some hydrocodone because of the pain in his hand due to arthritis but these were not prescribed to him. His drug screen was also positive for benzodiazepines but he claims he did not use these.  While in the hospital the patient went through a detox protocol. He was started on Celexa and Neurontin. He is also on hydroxyzine but stopped it because it  didn't help. He also stopped trazodone because it made him unable to walk. He states that he would like to switch his antidepressant to Lexapro because one of his friends has had a great result with it and I told him we could give it a try. He has not drank since he got out of the hospital or use any drugs. He is spending time with his family. 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 He's never been suicidal or had psychotic symptoms. He is sleeping well.  The patient returns after 2 months his mood is improved. 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 He is no longer drinking at all. He's not using any nonprescribed medicines. His general health has improved and his diabetes is under good control. He is not attending AA but claims he can manage this on his own and so far he is doing well. Review of Systems  Constitutional: Negative.   HENT: Negative.   Eyes: Negative.   Respiratory: Negative.   Cardiovascular: Negative.   Gastrointestinal: Negative.   Endocrine: Negative.   Genitourinary: Negative.   Musculoskeletal: Positive for arthralgias and myalgias.  Allergic/Immunologic: Negative.   Neurological: Negative.   Hematological: Negative.   Psychiatric/Behavioral: Positive for dysphoric mood. The patient is nervous/anxious.    Physical Exam  Depressive Symptoms: depressed mood, anhedonia, psychomotor retardation, hopelessness, anxiety,  (Hypo) Manic Symptoms:   Elevated Mood:  No Irritable Mood:  No Grandiosity:  No Distractibility:  No Labiality of Mood:  No Delusions:  No Hallucinations:  No Impulsivity:  No Sexually Inappropriate Behavior:  No Financial Extravagance:  No Flight of Ideas:  No  Anxiety Symptoms: Excessive Worry:  Yes Panic Symptoms:  Yes Agoraphobia:  No Obsessive Compulsive: No  Symptoms: None, Specific Phobias:  No Social Anxiety:  No  Psychotic Symptoms:  Hallucinations: No None Delusions:  No Paranoia:  No   Ideas  of Reference:  No  PTSD Symptoms: Ever had a traumatic exposure:  No Had a traumatic exposure in the last month:  No Re-experiencing: No None Hypervigilance:  No Hyperarousal: No None Avoidance: No None  Traumatic Brain Injury: No  Past Psychiatric History: Diagnosis: Alcohol dependence, depression   Hospitalizations: Behavioral health hospital last month and in 2008 and 2012   Outpatient Care: None   Substance Abuse Care: Same as hospitalizations   Self-Mutilation: None   Suicidal Attempts: None   Violent Behaviors: None    Past Medical History:   Past Medical History  Diagnosis Date  . Arthritis   . Gout   . Alcohol abuse   . Hypertension   . Hyperthyroidism   . Diabetes mellitus without complication   . GERD (gastroesophageal reflux disease)   . Diabetes mellitus, type II    History of Loss of Consciousness:  No Seizure History:  No Cardiac History:  No Allergies:   Allergies  Allergen Reactions  . Trazodone And Nefazodone     Unable to walk   Current Medications:  Current Outpatient Prescriptions  Medication Sig Dispense Refill  . colchicine 0.6 MG tablet Take 1 tablet (0.6 mg total) by mouth daily. For gout arthritis      . escitalopram (LEXAPRO) 20 MG tablet Take 1 tablet (20 mg total) by mouth daily.  90 tablet  2  . gabapentin (NEURONTIN) 100 MG capsule Take 2 capsules (200 mg total) by mouth 3 (three) times daily. For substance withdrawal syndrome, anxiety/pain  180 capsule  2  . lansoprazole (PREVACID) 30 MG capsule Take 1 capsule (30 mg total) by mouth daily. For acid reflux      . levothyroxine (SYNTHROID, LEVOTHROID) 100 MCG tablet Take 1 tablet (100 mcg total) by mouth daily. For thyroid hormone replacement      . metFORMIN (GLUMETZA) 1000 MG (MOD) 24 hr tablet Take 1 tablet (1,000 mg total) by mouth daily with breakfast. For diabetes      . Olmesartan-Amlodipine-HCTZ (TRIBENZOR) 40-10-12.5 MG TABS Take 1 tablet by mouth daily. For hypertension  30  tablet     No current facility-administered medications for this visit.    Previous Psychotropic Medications:  Medication Dose                          Substance Abuse History in the last 12 months: Substance Age of 1st Use Last Use Amount Specific Type  Nicotine      Alcohol    was drinking a fifth of liquor each day but stopped during hospitalization last month    Cannabis      Opiates    was using hydrocodone occasionally but stopped during hospitalization last month    Cocaine      Methamphetamines      LSD      Ecstasy      Benzodiazepines    this showed up on his drug screen but he denies using it    Caffeine  Inhalants      Others:                          Medical Consequences of Substance Abuse: Hospitalization needed for detox Legal Consequences of Substance Abuse: 1 DUI 77 years of  Family Consequences of Substance Abuse: Unknown Blackouts:  Yes DT's:  No Withdrawal Symptoms:  No None  Social History: Current Place of Residence: [place] of Birth: [place] Family Members 2 living sisters, several nieces and nephews Marital Status:  Single Children: none   Relationships: Has a girlfriend Education:  HS Soil scientist Problems/Performance:  Religious Beliefs/Practices: Christian History of Abuse: none Pensions consultant; 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 Legal History: 1 DUI 35 years ago Hobbies/Interests: Watching sports on TV  Family History:   Family History  Problem Relation Age of Onset  . Alcohol abuse Paternal Aunt   . Drug abuse Paternal Uncle   . Alcohol abuse Paternal Uncle     Mental Status Examination/Evaluation: Objective:  Appearance: Casual and Fairly Groomed  Eye Contact::  Good  Speech:  Clear and Coherent  Volume:  Normal  Mood:  Good today   Affect:  Full Range  Thought Process:  Goal Directed  Orientation:  Full (Time, Place, and Person)  Thought  Content:  WDL  Suicidal Thoughts:  No  Homicidal Thoughts:  No  Judgement:  Fair  Insight:  Fair  Psychomotor Activity:  Normal  Akathisia:  No  Handed:  Right  AIMS (if indicated):    Assets:  Communication Skills Desire for Improvement    Laboratory/X-Ray Psychological Evaluation(s)   Reviewed from hospital stay      Assessment:  Axis I: Alcohol Abuse and Major Depression, Recurrent severe  AXIS I Alcohol Abuse and Major Depression, Recurrent severe  AXIS II Deferred  AXIS III Past Medical History  Diagnosis Date  . Arthritis   . Gout   . Alcohol abuse   . Hypertension   . Hyperthyroidism   . Diabetes mellitus without complication   . GERD (gastroesophageal reflux disease)   . Diabetes mellitus, type II      AXIS IV other psychosocial or environmental problems  AXIS V 61-70 mild symptoms   Treatment Plan/Recommendations:  Plan of Care: Medication management   Laboratory:  Psychotherapy: The patient will be assigned a counselor here   Medications: He will continue Neurontin 100-200 mg 3 times a da y and Lexapro 20 mg every morning for mood stabilization   Routine PRN Medications:  No  Consultations:   Safety Concerns:    Other: He will return in 3 months    Levonne Spiller, MD 6/2/20151:06 PM

## 2014-08-01 ENCOUNTER — Ambulatory Visit (HOSPITAL_COMMUNITY): Payer: Self-pay | Admitting: Psychiatry

## 2014-08-17 DIAGNOSIS — I10 Essential (primary) hypertension: Secondary | ICD-10-CM | POA: Diagnosis not present

## 2014-08-17 DIAGNOSIS — E119 Type 2 diabetes mellitus without complications: Secondary | ICD-10-CM | POA: Diagnosis not present

## 2014-09-18 ENCOUNTER — Telehealth (HOSPITAL_COMMUNITY): Payer: Self-pay | Admitting: *Deleted

## 2014-09-18 NOTE — Telephone Encounter (Addendum)
Opened in Error.

## 2014-09-20 ENCOUNTER — Ambulatory Visit (INDEPENDENT_AMBULATORY_CARE_PROVIDER_SITE_OTHER): Payer: Medicare Other | Admitting: Psychiatry

## 2014-09-20 ENCOUNTER — Encounter (HOSPITAL_COMMUNITY): Payer: Self-pay | Admitting: Psychiatry

## 2014-09-20 VITALS — BP 121/57 | HR 54 | Ht 73.0 in | Wt 219.2 lb

## 2014-09-20 DIAGNOSIS — F1010 Alcohol abuse, uncomplicated: Secondary | ICD-10-CM

## 2014-09-20 DIAGNOSIS — F332 Major depressive disorder, recurrent severe without psychotic features: Secondary | ICD-10-CM

## 2014-09-20 DIAGNOSIS — F411 Generalized anxiety disorder: Secondary | ICD-10-CM

## 2014-09-20 MED ORDER — ESCITALOPRAM OXALATE 20 MG PO TABS: 20.0000 mg | ORAL_TABLET | Freq: Two times a day (BID) | ORAL | Status: DC

## 2014-09-20 MED ORDER — GABAPENTIN 100 MG PO CAPS: 200.0000 mg | ORAL_CAPSULE | Freq: Three times a day (TID) | ORAL | Status: DC

## 2014-09-20 NOTE — Progress Notes (Signed)
Patient ID: Kevin [name], male   DOB: 12/27/1945, 68 y.o.   MRN: [id number] Patient ID: Kevin [name], male   DOB: [birth year], 68 y.o.   MRN: [id number] Patient ID: Kevin [name], male   DOB: Jul 08, 1946, 68 y.o.   MRN: [id number]  Psychiatric Assessment Adult  Patient Identification:  Kevin [name] Date of Evaluation:  09/20/2014 Chief Complaint: I'm doing much better  History of Chief Complaint:   Chief Complaint  Patient presents with  . Anxiety  . Depression  . Follow-up    Anxiety Symptoms include nervous/anxious behavior.    Alcohol Problem   this patient is a 68 year old single black male who lives alone in [place]. He has no children. 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  The patient was referred by the behavioral health hospital where he was hospitalized from February 4 through the 18th. He was there to detox off alcohol, benzodiazepines and narcotic pills. He also has a history of depression. This is his third hospitalization, having been previously hospitalized in 2012 and 2008 for the same issues. He's never really attended much counseling or AA even all this was recommended in the past.  The patient states that his depression began when he was much younger although he doesn't have any particular reason to feel depressed. He never sought out any treatment while he worked. He retired about 10 years ago and then began to drink more heavily to deal with his low mood and anxiety. Over the last few weeks before admission he was drinking about a fifth of liquor a day. He was not eating and was very depressed. He admits that he use some hydrocodone because of the pain in his hand due to arthritis but these were not prescribed to him. His drug screen was also positive for benzodiazepines but he claims he did not use these.  While in the hospital the patient went through a detox protocol. He was started on Celexa and  Neurontin. He is also on hydroxyzine but stopped it because it didn't help. He also stopped trazodone because it made him unable to walk. He states that he would like to switch his antidepressant to Lexapro because one of his friends has had a great result with it and I told him we could give it a try. He has not drank since he got out of the hospital or use any drugs. He is spending time with his family. 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 He's never been suicidal or had psychotic symptoms. He is sleeping well.  The patient returns after 3 months. He gotten mixed up about his medicine and was taking the Celexa twice a day. He states he was more nervous and worried because he had a court date coming up. He stated that he felt better on the 40 mg daily. I told him this is okay but he needed to call me we'll before he made any medication changes. He denies suicidal ideation. He is staying busy and active. He is no longer drinking or using any illegal drugs Review of Systems  Constitutional: Negative.   HENT: Negative.   Eyes: Negative.   Respiratory: Negative.   Cardiovascular: Negative.   Gastrointestinal: Negative.   Endocrine: Negative.   Genitourinary: Negative.   Musculoskeletal: Positive for arthralgias and myalgias.  Allergic/Immunologic: Negative.   Neurological: Negative.   Hematological: Negative.   Psychiatric/Behavioral: Positive for  dysphoric mood. The patient is nervous/anxious.    Physical Exam  Depressive Symptoms: depressed mood, anhedonia, psychomotor retardation, hopelessness, anxiety,  (Hypo) Manic Symptoms:   Elevated Mood:  No Irritable Mood:  No Grandiosity:  No Distractibility:  No Labiality of Mood:  No Delusions:  No Hallucinations:  No Impulsivity:  No Sexually Inappropriate Behavior:  No Financial Extravagance:  No Flight of Ideas:  No  Anxiety Symptoms: Excessive Worry:  Yes Panic Symptoms:  Yes Agoraphobia:   No Obsessive Compulsive: No  Symptoms: None, Specific Phobias:  No Social Anxiety:  No  Psychotic Symptoms:  Hallucinations: No None Delusions:  No Paranoia:  No   Ideas of Reference:  No  PTSD Symptoms: Ever had a traumatic exposure:  No Had a traumatic exposure in the last month:  No Re-experiencing: No None Hypervigilance:  No Hyperarousal: No None Avoidance: No None  Traumatic Brain Injury: No  Past Psychiatric History: Diagnosis: Alcohol dependence, depression   Hospitalizations: Behavioral health hospital last month and in 2008 and 2012   Outpatient Care: None   Substance Abuse Care: Same as hospitalizations   Self-Mutilation: None   Suicidal Attempts: None   Violent Behaviors: None    Past Medical History:   Past Medical History  Diagnosis Date  . Arthritis   . Gout   . Alcohol abuse   . Hypertension   . Hyperthyroidism   . Diabetes mellitus without complication   . GERD (gastroesophageal reflux disease)   . Diabetes mellitus, type II    History of Loss of Consciousness:  No Seizure History:  No Cardiac History:  No Allergies:   Allergies  Allergen Reactions  . Trazodone And Nefazodone     Unable to walk   Current Medications:  Current Outpatient Prescriptions  Medication Sig Dispense Refill  . colchicine 0.6 MG tablet Take 1 tablet (0.6 mg total) by mouth daily. For gout arthritis      . escitalopram (LEXAPRO) 20 MG tablet Take 1 tablet (20 mg total) by mouth 2 (two) times daily.  180 tablet  2  . gabapentin (NEURONTIN) 100 MG capsule Take 2 capsules (200 mg total) by mouth 3 (three) times daily. For substance withdrawal syndrome, anxiety/pain  180 capsule  2  . lansoprazole (PREVACID) 30 MG capsule Take 1 capsule (30 mg total) by mouth daily. For acid reflux      . levothyroxine (SYNTHROID, LEVOTHROID) 100 MCG tablet Take 1 tablet (100 mcg total) by mouth daily. For thyroid hormone replacement      . metFORMIN (GLUMETZA) 1000 MG (MOD) 24 hr tablet  Take 1 tablet (1,000 mg total) by mouth daily with breakfast. For diabetes      . Olmesartan-Amlodipine-HCTZ (TRIBENZOR) 40-10-12.5 MG TABS Take 1 tablet by mouth daily. For hypertension  30 tablet     No current facility-administered medications for this visit.    Previous Psychotropic Medications:  Medication Dose                          Substance Abuse History in the last 12 months: Substance Age of 1st Use Last Use Amount Specific Type  Nicotine      Alcohol    was drinking a fifth of liquor each day but stopped during hospitalization last month    Cannabis      Opiates    was using hydrocodone occasionally but stopped during hospitalization last month    Cocaine      Methamphetamines  LSD      Ecstasy      Benzodiazepines    this showed up on his drug screen but he denies using it    Caffeine      Inhalants      Others:                          Medical Consequences of Substance Abuse: Hospitalization needed for detox Legal Consequences of Substance Abuse: 1 DUI 35 years of  Family Consequences of Substance Abuse: Unknown Blackouts:  Yes DT's:  No Withdrawal Symptoms:  No None  Social History: Current Place of Residence: [place] of Birth: [place] Members 2 living sisters, several nieces and nephews Marital Status:  Single Children: none   Relationships: Has a girlfriend Education:  HS Soil scientist Problems/Performance:  Religious Beliefs/Practices: Christian History of Abuse: none Pensions consultant; 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 Legal History: 1 DUI 35 years ago Hobbies/Interests: Watching sports on TV  Family History:   Family History  Problem Relation Age of Onset  . Alcohol abuse Paternal Aunt   . Drug abuse Paternal Uncle   . Alcohol abuse Paternal Uncle     Mental Status Examination/Evaluation: Objective:  Appearance: Casual and Fairly Groomed  Eye Contact::   Good  Speech:  Clear and Coherent  Volume:  Normal  Mood:  Good today   Affect:  Full Range  Thought Process:  Goal Directed  Orientation:  Full (Time, Place, and Person)  Thought Content:  WDL  Suicidal Thoughts:  No  Homicidal Thoughts:  No  Judgement:  Fair  Insight:  Fair  Psychomotor Activity:  Normal  Akathisia:  No  Handed:  Right  AIMS (if indicated):    Assets:  Communication Skills Desire for Improvement    Laboratory/X-Ray Psychological Evaluation(s)   Reviewed from hospital stay      Assessment:  Axis I: Alcohol Abuse and Major Depression, Recurrent severe  AXIS I Alcohol Abuse and Major Depression, Recurrent severe  AXIS II Deferred  AXIS III Past Medical History  Diagnosis Date  . Arthritis   . Gout   . Alcohol abuse   . Hypertension   . Hyperthyroidism   . Diabetes mellitus without complication   . GERD (gastroesophageal reflux disease)   . Diabetes mellitus, type II      AXIS IV other psychosocial or environmental problems  AXIS V 61-70 mild symptoms   Treatment Plan/Recommendations:  Plan of Care: Medication management   Laboratory:  Psychotherapy: The patient will be assigned a counselor here   Medications: He will continue Neurontin 100-200 mg 3 times a da y and Lexapro will be increased to 20 mg twice a day  for mood stabilization   Routine PRN Medications:  No  Consultations:   Safety Concerns:    Other: He will return in 3 months    Levonne Spiller, MD 10/22/201511:04 AM

## 2014-09-25 ENCOUNTER — Other Ambulatory Visit (HOSPITAL_COMMUNITY): Payer: Self-pay | Admitting: Psychiatry

## 2014-12-11 ENCOUNTER — Other Ambulatory Visit (HOSPITAL_COMMUNITY): Payer: Self-pay | Admitting: Psychiatry

## 2014-12-20 DIAGNOSIS — E291 Testicular hypofunction: Secondary | ICD-10-CM | POA: Diagnosis not present

## 2014-12-20 DIAGNOSIS — I10 Essential (primary) hypertension: Secondary | ICD-10-CM | POA: Diagnosis not present

## 2014-12-20 DIAGNOSIS — E0800 Diabetes mellitus due to underlying condition with hyperosmolarity without nonketotic hyperglycemic-hyperosmolar coma (NKHHC): Secondary | ICD-10-CM | POA: Diagnosis not present

## 2015-01-18 ENCOUNTER — Other Ambulatory Visit (HOSPITAL_COMMUNITY): Payer: Self-pay | Admitting: Psychiatry

## 2015-01-23 ENCOUNTER — Other Ambulatory Visit (HOSPITAL_COMMUNITY): Payer: Self-pay | Admitting: Psychiatry

## 2015-01-23 ENCOUNTER — Telehealth (HOSPITAL_COMMUNITY): Payer: Self-pay | Admitting: *Deleted

## 2015-01-23 MED ORDER — GABAPENTIN 100 MG PO CAPS: 200.0000 mg | ORAL_CAPSULE | Freq: Three times a day (TID) | ORAL | Status: DC

## 2015-01-23 NOTE — Telephone Encounter (Signed)
PATIENT CALLED FOR GABAPENTIN.   [facility] [address], [place].

## 2015-01-23 NOTE — Telephone Encounter (Signed)
Sent, but he needs to come back in

## 2015-01-24 NOTE — Telephone Encounter (Signed)
Pt made appt

## 2015-01-31 ENCOUNTER — Ambulatory Visit (INDEPENDENT_AMBULATORY_CARE_PROVIDER_SITE_OTHER): Payer: Medicare Other | Admitting: Psychiatry

## 2015-01-31 ENCOUNTER — Encounter (HOSPITAL_COMMUNITY): Payer: Self-pay | Admitting: Psychiatry

## 2015-01-31 VITALS — BP 119/61 | HR 54 | Ht 73.0 in | Wt 231.0 lb

## 2015-01-31 DIAGNOSIS — F1010 Alcohol abuse, uncomplicated: Secondary | ICD-10-CM

## 2015-01-31 DIAGNOSIS — F329 Major depressive disorder, single episode, unspecified: Secondary | ICD-10-CM

## 2015-01-31 DIAGNOSIS — F411 Generalized anxiety disorder: Secondary | ICD-10-CM

## 2015-01-31 MED ORDER — ESCITALOPRAM OXALATE 20 MG PO TABS: 20.0000 mg | ORAL_TABLET | Freq: Two times a day (BID) | ORAL | Status: DC

## 2015-01-31 MED ORDER — GABAPENTIN 100 MG PO CAPS: 200.0000 mg | ORAL_CAPSULE | Freq: Three times a day (TID) | ORAL | Status: DC

## 2015-01-31 NOTE — Progress Notes (Signed)
Patient ID: Kevin [name], male   DOB: [birth year], 69 y.o.   MRN: [id number] Patient ID: Kevin [name], male   DOB: 1946/10/05, 69 y.o.   MRN: [id number] Patient ID: Kevin [name], male   DOB: [birth year], 69 y.o.   MRN: [id number] Patient ID: Kevin [name], male   DOB: 11-28-1946, 69 y.o.   MRN: [id number]  Psychiatric Assessment Adult  Patient Identification:  Kevin [name] Date of Evaluation:  01/31/2015 Chief Complaint: I'm doing much better  History of Chief Complaint:   Chief Complaint  Patient presents with  . Depression  . Follow-up    Anxiety Symptoms include nervous/anxious behavior.    Alcohol Problem   this patient is a 69 year old single black male who lives alone in [place]. He has no children. 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  The patient was referred by the behavioral health hospital where he was hospitalized from February 4 through the 18th. He was there to detox off alcohol, benzodiazepines and narcotic pills. He also has a history of depression. This is his third hospitalization, having been previously hospitalized in 2012 and 2008 for the same issues. He's never really attended much counseling or AA even all this was recommended in the past.  The patient states that his depression began when he was much younger although he doesn't have any particular reason to feel depressed. He never sought out any treatment while he worked. He retired about 10 years ago and then began to drink more heavily to deal with his low mood and anxiety. Over the last few weeks before admission he was drinking about a fifth of liquor a day. He was not eating and was very depressed. He admits that he use some hydrocodone because of the pain in his hand due to arthritis but these were not prescribed to him. His drug screen was also positive for benzodiazepines but he claims he did not use these.  While in the hospital the patient  went through a detox protocol. He was started on Celexa and Neurontin. He is also on hydroxyzine but stopped it because it didn't help. He also stopped trazodone because it made him unable to walk. He states that he would like to switch his antidepressant to Lexapro because one of his friends has had a great result with it and I told him we could give it a try. He has not drank since he got out of the hospital or use any drugs. He is spending time with his family. 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 He's never been suicidal or had psychotic symptoms. He is sleeping well.  The patient returns after 5 months. He states that he is doing well. He had a court date about selling a stolen vehicle that he didn't know was stolen. Fortunately the charges got dismissed. He is very relieved. He is only try drinking once on the day of the Super Bowl and it made him feel bad so he is not return to it at all. He's not using any drugs. His mood is been stable and he sleeping and eating well. He gets out with his friends pretty much every day Review of Systems  Constitutional: Negative.   HENT: Negative.   Eyes: Negative.   Respiratory: Negative.   Cardiovascular: Negative.   Gastrointestinal: Negative.   Endocrine: Negative.   Genitourinary: Negative.   Musculoskeletal: Positive for myalgias and  arthralgias.  Allergic/Immunologic: Negative.   Neurological: Negative.   Hematological: Negative.   Psychiatric/Behavioral: Positive for dysphoric mood. The patient is nervous/anxious.    Physical Exam  Depressive Symptoms: depressed mood, anhedonia, psychomotor retardation, hopelessness, anxiety,  (Hypo) Manic Symptoms:   Elevated Mood:  No Irritable Mood:  No Grandiosity:  No Distractibility:  No Labiality of Mood:  No Delusions:  No Hallucinations:  No Impulsivity:  No Sexually Inappropriate Behavior:  No Financial Extravagance:  No Flight of Ideas:  No  Anxiety  Symptoms: Excessive Worry:  Yes Panic Symptoms:  Yes Agoraphobia:  No Obsessive Compulsive: No  Symptoms: None, Specific Phobias:  No Social Anxiety:  No  Psychotic Symptoms:  Hallucinations: No None Delusions:  No Paranoia:  No   Ideas of Reference:  No  PTSD Symptoms: Ever had a traumatic exposure:  No Had a traumatic exposure in the last month:  No Re-experiencing: No None Hypervigilance:  No Hyperarousal: No None Avoidance: No None  Traumatic Brain Injury: No  Past Psychiatric History: Diagnosis: Alcohol dependence, depression   Hospitalizations: Behavioral health hospital last month and in 2008 and 2012   Outpatient Care: None   Substance Abuse Care: Same as hospitalizations   Self-Mutilation: None   Suicidal Attempts: None   Violent Behaviors: None    Past Medical History:   Past Medical History  Diagnosis Date  . Arthritis   . Gout   . Alcohol abuse   . Hypertension   . Hyperthyroidism   . Diabetes mellitus without complication   . GERD (gastroesophageal reflux disease)   . Diabetes mellitus, type II    History of Loss of Consciousness:  No Seizure History:  No Cardiac History:  No Allergies:   Allergies  Allergen Reactions  . Trazodone And Nefazodone     Unable to walk   Current Medications:  Current Outpatient Prescriptions  Medication Sig Dispense Refill  . colchicine 0.6 MG tablet Take 1 tablet (0.6 mg total) by mouth daily. For gout arthritis    . escitalopram (LEXAPRO) 20 MG tablet Take 1 tablet (20 mg total) by mouth 2 (two) times daily. 180 tablet 2  . gabapentin (NEURONTIN) 100 MG capsule Take 2 capsules (200 mg total) by mouth 3 (three) times daily. For substance withdrawal syndrome, anxiety/pain 180 capsule 2  . lansoprazole (PREVACID) 30 MG capsule Take 1 capsule (30 mg total) by mouth daily. For acid reflux (Patient taking differently: Take 30 mg by mouth daily as needed. For acid reflux)    . levothyroxine (SYNTHROID, LEVOTHROID) 100  MCG tablet Take 1 tablet (100 mcg total) by mouth daily. For thyroid hormone replacement    . metFORMIN (GLUMETZA) 1000 MG (MOD) 24 hr tablet Take 1 tablet (1,000 mg total) by mouth daily with breakfast. For diabetes    . Olmesartan-Amlodipine-HCTZ (TRIBENZOR) 40-10-12.5 MG TABS Take 1 tablet by mouth daily. For hypertension 30 tablet    No current facility-administered medications for this visit.    Previous Psychotropic Medications:  Medication Dose                          Substance Abuse History in the last 12 months: Substance Age of 1st Use Last Use Amount Specific Type  Nicotine      Alcohol    was drinking a fifth of liquor each day but stopped during hospitalization last month    Cannabis      Opiates    was using hydrocodone occasionally but  stopped during hospitalization last month    Cocaine      Methamphetamines      LSD      Ecstasy      Benzodiazepines    this showed up on his drug screen but he denies using it    Caffeine      Inhalants      Others:                          Medical Consequences of Substance Abuse: Hospitalization needed for detox Legal Consequences of Substance Abuse: 1 DUI 35 years of  Family Consequences of Substance Abuse: Unknown Blackouts:  Yes DT's:  No Withdrawal Symptoms:  No None  Social History: Current Place of Residence: [place] of Birth: [place] Members 2 living sisters, several nieces and nephews Marital Status:  Single Children: none   Relationships: Has a girlfriend Education:  HS Soil scientist Problems/Performance:  Religious Beliefs/Practices: Christian History of Abuse: none Pensions consultant; 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 Legal History: 1 DUI 35 years ago Hobbies/Interests: Watching sports on TV  Family History:   Family History  Problem Relation Age of Onset  . Alcohol abuse Paternal Aunt   . Drug abuse Paternal Uncle   .  Alcohol abuse Paternal Uncle     Mental Status Examination/Evaluation: Objective:  Appearance: Casual and Fairly Groomed  Eye Contact::  Good  Speech:  Clear and Coherent  Volume:  Normal  Mood:  Good today   Affect:  Full Range  Thought Process:  Goal Directed  Orientation:  Full (Time, Place, and Person)  Thought Content:  WDL  Suicidal Thoughts:  No  Homicidal Thoughts:  No  Judgement:  Fair  Insight:  Fair  Psychomotor Activity:  Normal  Akathisia:  No  Handed:  Right  AIMS (if indicated):    Assets:  Communication Skills Desire for Improvement    Laboratory/X-Ray Psychological Evaluation(s)   Reviewed from hospital stay      Assessment:  Axis I: Alcohol Abuse and Major Depression, Recurrent severe  AXIS I Alcohol Abuse and Major Depression, Recurrent severe  AXIS II Deferred  AXIS III Past Medical History  Diagnosis Date  . Arthritis   . Gout   . Alcohol abuse   . Hypertension   . Hyperthyroidism   . Diabetes mellitus without complication   . GERD (gastroesophageal reflux disease)   . Diabetes mellitus, type II      AXIS IV other psychosocial or environmental problems  AXIS V 61-70 mild symptoms   Treatment Plan/Recommendations:  Plan of Care: Medication management   Laboratory:  Psychotherapy: The patient will be assigned a counselor here   Medications: He will continue Neurontin 100-200 mg 3 times a da y and Lexapro  20 mg twice a day  for mood stabilization   Routine PRN Medications:  No  Consultations:   Safety Concerns:    Other: He will return in 5 months    Levonne Spiller, MD 3/3/201611:02 AM

## 2015-05-23 DIAGNOSIS — I10 Essential (primary) hypertension: Secondary | ICD-10-CM | POA: Diagnosis not present

## 2015-05-23 DIAGNOSIS — E0800 Diabetes mellitus due to underlying condition with hyperosmolarity without nonketotic hyperglycemic-hyperosmolar coma (NKHHC): Secondary | ICD-10-CM | POA: Diagnosis not present

## 2015-06-04 ENCOUNTER — Encounter (HOSPITAL_COMMUNITY): Payer: Self-pay | Admitting: Psychiatry

## 2015-06-04 ENCOUNTER — Telehealth (HOSPITAL_COMMUNITY): Payer: Self-pay | Admitting: *Deleted

## 2015-06-29 ENCOUNTER — Other Ambulatory Visit (HOSPITAL_COMMUNITY): Payer: Self-pay | Admitting: Psychiatry

## 2015-07-03 ENCOUNTER — Ambulatory Visit (HOSPITAL_COMMUNITY): Payer: Self-pay | Admitting: Psychiatry

## 2015-07-10 ENCOUNTER — Telehealth (HOSPITAL_COMMUNITY): Payer: Self-pay | Admitting: *Deleted

## 2015-07-11 NOTE — Telephone Encounter (Signed)
Pt pharmacy called 07-10-15 asking for refills for pt Gabapentin and Lexapro. Informed pharmacist that pt need to call office to sch appt due to pt not answering his phone. Informed pharmacist to deny script and if pt call them to have pt call office and medications will be refills. Pharmacist agreed

## 2015-07-26 DIAGNOSIS — M1000 Idiopathic gout, unspecified site: Secondary | ICD-10-CM | POA: Diagnosis not present

## 2015-07-26 DIAGNOSIS — I10 Essential (primary) hypertension: Secondary | ICD-10-CM | POA: Diagnosis not present

## 2015-07-26 DIAGNOSIS — E0800 Diabetes mellitus due to underlying condition with hyperosmolarity without nonketotic hyperglycemic-hyperosmolar coma (NKHHC): Secondary | ICD-10-CM | POA: Diagnosis not present

## 2015-07-26 DIAGNOSIS — E039 Hypothyroidism, unspecified: Secondary | ICD-10-CM | POA: Diagnosis not present

## 2015-08-08 DIAGNOSIS — Z0000 Encounter for general adult medical examination without abnormal findings: Secondary | ICD-10-CM | POA: Diagnosis not present

## 2015-08-14 ENCOUNTER — Telehealth (HOSPITAL_COMMUNITY): Payer: Self-pay | Admitting: *Deleted

## 2015-08-14 NOTE — Telephone Encounter (Signed)
NEED REFILL OF GABAPENTIN

## 2015-08-16 ENCOUNTER — Other Ambulatory Visit (HOSPITAL_COMMUNITY): Payer: Self-pay | Admitting: Psychiatry

## 2015-08-16 MED ORDER — GABAPENTIN 100 MG PO CAPS: 200.0000 mg | ORAL_CAPSULE | Freq: Three times a day (TID) | ORAL | Status: DC

## 2015-08-16 MED ORDER — ESCITALOPRAM OXALATE 20 MG PO TABS: 20.0000 mg | ORAL_TABLET | Freq: Two times a day (BID) | ORAL | Status: DC

## 2015-08-16 NOTE — Telephone Encounter (Signed)
Sent, he needs appt

## 2015-08-16 NOTE — Telephone Encounter (Signed)
Called pt back due to previous phone call. Per pt, he is out of his Lexapro and Gabapentin. Per pt he have been out of his Gabapentin for 1 month and just forgot to call office. Per pt, his with his Lexapro, he is cutting the dose in half to make it last. Office imformd pt to call office when ever he starts to get low.Pt agreed and end stated that he need refills for those two medication.

## 2015-08-26 NOTE — Telephone Encounter (Signed)
Pt have sch appt

## 2015-09-10 ENCOUNTER — Encounter (HOSPITAL_COMMUNITY): Payer: Self-pay | Admitting: Psychiatry

## 2015-09-10 ENCOUNTER — Ambulatory Visit (INDEPENDENT_AMBULATORY_CARE_PROVIDER_SITE_OTHER): Payer: Medicare Other | Admitting: Psychiatry

## 2015-09-10 VITALS — BP 125/62 | HR 60 | Ht 73.0 in | Wt 236.0 lb

## 2015-09-10 DIAGNOSIS — F332 Major depressive disorder, recurrent severe without psychotic features: Secondary | ICD-10-CM

## 2015-09-10 DIAGNOSIS — F1010 Alcohol abuse, uncomplicated: Secondary | ICD-10-CM | POA: Diagnosis not present

## 2015-09-10 DIAGNOSIS — F1994 Other psychoactive substance use, unspecified with psychoactive substance-induced mood disorder: Secondary | ICD-10-CM

## 2015-09-10 MED ORDER — ESCITALOPRAM OXALATE 20 MG PO TABS: 20.0000 mg | ORAL_TABLET | Freq: Two times a day (BID) | ORAL | Status: DC

## 2015-09-10 MED ORDER — GABAPENTIN 100 MG PO CAPS: 200.0000 mg | ORAL_CAPSULE | Freq: Three times a day (TID) | ORAL | Status: DC

## 2015-09-10 NOTE — Progress Notes (Signed)
Patient ID: Kevin [name], male   DOB: Jun 01, 1946, 69 y.o.   MRN: [id number] Patient ID: Kevin [name], male   DOB: 12/10/1945, 69 y.o.   MRN: [id number] Patient ID: Kevin [name], male   DOB: 03/04/1946, 69 y.o.   MRN: [id number] Patient ID: Kevin [name], male   DOB: December 26, 1945, 69 y.o.   MRN: [id number] Patient ID: Kevin [name], male   DOB: 01/20/1946, 69 y.o.   MRN: [id number]  Psychiatric Assessment Adult  Patient Identification:  Kevin [name] Date of Evaluation:  09/10/2015 Chief Complaint: I'm doing ok History of Chief Complaint:   Chief Complaint  Patient presents with  . Depression  . Alcohol Problem  . Follow-up    Depression        Associated symptoms include myalgias.  Past medical history includes anxiety.   Alcohol Problem  Anxiety Symptoms include nervous/anxious behavior.     this patient is a 69 year old single black male who lives alone in [place]. He has no children. 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  The patient was referred by the behavioral health hospital where he was hospitalized from February 4 through the 18th. He was there to detox off alcohol, benzodiazepines and narcotic pills. He also has a history of depression. This is his third hospitalization, having been previously hospitalized in 2012 and 2008 for the same issues. He's never really attended much counseling or AA even all this was recommended in the past.  The patient states that his depression began when he was much younger although he doesn't have any particular reason to feel depressed. He never sought out any treatment while he worked. He retired about 10 years ago and then began to drink more heavily to deal with his low mood and anxiety. Over the last few weeks before admission he was drinking about a fifth of liquor a day. He was not eating and was very depressed. He admits that he use some hydrocodone because of the pain in his  hand due to arthritis but these were not prescribed to him. His drug screen was also positive for benzodiazepines but he claims he did not use these.  While in the hospital the patient went through a detox protocol. He was started on Lexapro and Neurontin. He is also on hydroxyzine but stopped it because it didn't help. He also stopped trazodone because it made him unable to walk. He states that he would like to switch his antidepressant to Lexapro because one of his friends has had a great result with it and I told him we could give it a try. He has not drank since he got out of the hospital or use any drugs. He is spending time with his family. 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 He's never been suicidal or had psychotic symptoms. He is sleeping well.  The patient returns after 7 months. He missed an appointment. He states that he is still on Lexapro  but he ran out of the gabapentin. In general his mood is been good but recently a friend of his children himself and this is been a hard situation for him. He spent a lot of time with this friend and had no idea that the other man was depressed. The patient is eating and sleeping well and staying busy around his house. He has no specific complaints about his medications. He states he drank a little  bit on Labor Day but other than that has not been drinking or abusing any drugs Review of Systems  Constitutional: Negative.   HENT: Negative.   Eyes: Negative.   Respiratory: Negative.   Cardiovascular: Negative.   Gastrointestinal: Negative.   Endocrine: Negative.   Genitourinary: Negative.   Musculoskeletal: Positive for myalgias and arthralgias.  Allergic/Immunologic: Negative.   Neurological: Negative.   Hematological: Negative.   Psychiatric/Behavioral: Positive for depression and dysphoric mood. The patient is nervous/anxious.    Physical Exam  Depressive Symptoms: depressed mood, anhedonia, psychomotor  retardation, hopelessness, anxiety,  (Hypo) Manic Symptoms:   Elevated Mood:  No Irritable Mood:  No Grandiosity:  No Distractibility:  No Labiality of Mood:  No Delusions:  No Hallucinations:  No Impulsivity:  No Sexually Inappropriate Behavior:  No Financial Extravagance:  No Flight of Ideas:  No  Anxiety Symptoms: Excessive Worry:  Yes Panic Symptoms:  Yes Agoraphobia:  No Obsessive Compulsive: No  Symptoms: None, Specific Phobias:  No Social Anxiety:  No  Psychotic Symptoms:  Hallucinations: No None Delusions:  No Paranoia:  No   Ideas of Reference:  No  PTSD Symptoms: Ever had a traumatic exposure:  No Had a traumatic exposure in the last month:  No Re-experiencing: No None Hypervigilance:  No Hyperarousal: No None Avoidance: No None  Traumatic Brain Injury: No  Past Psychiatric History: Diagnosis: Alcohol dependence, depression   Hospitalizations: Behavioral health hospital last month and in 2008 and 2012   Outpatient Care: None   Substance Abuse Care: Same as hospitalizations   Self-Mutilation: None   Suicidal Attempts: None   Violent Behaviors: None    Past Medical History:   Past Medical History  Diagnosis Date  . Arthritis   . Gout   . Alcohol abuse   . Hypertension   . Hyperthyroidism   . Diabetes mellitus without complication ([place])   . GERD (gastroesophageal reflux disease)   . Diabetes mellitus, type II ([place])    History of Loss of Consciousness:  No Seizure History:  No Cardiac History:  No Allergies:   Allergies  Allergen Reactions  . Trazodone And Nefazodone     Unable to walk   Current Medications:  Current Outpatient Prescriptions  Medication Sig Dispense Refill  . colchicine 0.6 MG tablet Take 1 tablet (0.6 mg total) by mouth daily. For gout arthritis    . escitalopram (LEXAPRO) 20 MG tablet Take 1 tablet (20 mg total) by mouth 2 (two) times daily. 180 tablet 2  . gabapentin (NEURONTIN) 100 MG capsule Take 2 capsules (200  mg total) by mouth 3 (three) times daily. For substance withdrawal syndrome, anxiety/pain 180 capsule 2  . lansoprazole (PREVACID) 30 MG capsule Take 1 capsule (30 mg total) by mouth daily. For acid reflux (Patient taking differently: Take 30 mg by mouth daily as needed. For acid reflux)    . levothyroxine (SYNTHROID, LEVOTHROID) 100 MCG tablet Take 1 tablet (100 mcg total) by mouth daily. For thyroid hormone replacement    . meloxicam (MOBIC) 15 MG tablet Take 15 mg by mouth daily as needed.  2  . metFORMIN (GLUMETZA) 1000 MG (MOD) 24 hr tablet Take 1 tablet (1,000 mg total) by mouth daily with breakfast. For diabetes    . Olmesartan-Amlodipine-HCTZ (TRIBENZOR) 40-10-12.5 MG TABS Take 1 tablet by mouth daily. For hypertension 30 tablet    No current facility-administered medications for this visit.    Previous Psychotropic Medications:  Medication Dose  Substance Abuse History in the last 12 months: Substance Age of 1st Use Last Use Amount Specific Type  Nicotine      Alcohol    was drinking a fifth of liquor each day but stopped during hospitalization last month    Cannabis      Opiates    was using hydrocodone occasionally but stopped during hospitalization last month    Cocaine      Methamphetamines      LSD      Ecstasy      Benzodiazepines    this showed up on his drug screen but he denies using it    Caffeine      Inhalants      Others:                          Medical Consequences of Substance Abuse: Hospitalization needed for detox Legal Consequences of Substance Abuse: 1 DUI 35 years of  Family Consequences of Substance Abuse: Unknown Blackouts:  Yes DT's:  No Withdrawal Symptoms:  No None  Social History: Current Place of Residence: [place] of Birth: [place] Family Members 2 living sisters, several nieces and nephews Marital Status:  Single Children: none   Relationships: Has a girlfriend Education:  HS  Soil scientist Problems/Performance:  Religious Beliefs/Practices: Christian History of Abuse: none Pensions consultant; 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 Legal History: 1 DUI 35 years ago Hobbies/Interests: Watching sports on TV  Family History:   Family History  Problem Relation Age of Onset  . Alcohol abuse Paternal Aunt   . Drug abuse Paternal Uncle   . Alcohol abuse Paternal Uncle     Mental Status Examination/Evaluation: Objective:  Appearance: Casual and Fairly Groomed  Eye Contact::  Good  Speech:  Clear and Coherent  Volume:  Normal  Mood:  Good today   Affect:  Full Range  Thought Process:  Goal Directed  Orientation:  Full (Time, Place, and Person)  Thought Content:  WDL  Suicidal Thoughts:  No  Homicidal Thoughts:  No  Judgement:  Fair  Insight:  Fair  Psychomotor Activity:  Normal  Akathisia:  No  Handed:  Right  AIMS (if indicated):    Assets:  Communication Skills Desire for Improvement    Laboratory/X-Ray Psychological Evaluation(s)   Reviewed from hospital stay      Assessment:  Axis I: Alcohol Abuse and Major Depression, Recurrent severe  AXIS I Alcohol Abuse and Major Depression, Recurrent severe  AXIS II Deferred  AXIS III Past Medical History  Diagnosis Date  . Arthritis   . Gout   . Alcohol abuse   . Hypertension   . Hyperthyroidism   . Diabetes mellitus without complication ([place])   . GERD (gastroesophageal reflux disease)   . Diabetes mellitus, type II ([place])      AXIS IV other psychosocial or environmental problems  AXIS V 61-70 mild symptoms   Treatment Plan/Recommendations:  Plan of Care: Medication management   Laboratory:  Psychotherapy: The patient will be assigned a counselor here   Medications: He will continue Neurontin 100-200 mg 3 times a da y and Lexapro  20 mg twice a day  for mood stabilization   Routine PRN Medications:  No  Consultations:   Safety Concerns: He denies  thoughts of harm to self or others   Other: He will return in 4 months    Levonne Spiller, MD 10/11/20169:05 AM

## 2015-12-10 ENCOUNTER — Other Ambulatory Visit (HOSPITAL_COMMUNITY): Payer: Self-pay | Admitting: Psychiatry

## 2016-01-10 ENCOUNTER — Encounter (HOSPITAL_COMMUNITY): Payer: Self-pay | Admitting: Psychiatry

## 2016-01-10 ENCOUNTER — Ambulatory Visit (INDEPENDENT_AMBULATORY_CARE_PROVIDER_SITE_OTHER): Payer: Medicare Other | Admitting: Psychiatry

## 2016-01-10 ENCOUNTER — Ambulatory Visit (HOSPITAL_COMMUNITY): Payer: Self-pay | Admitting: Psychiatry

## 2016-01-10 VITALS — BP 137/77 | HR 58 | Ht 73.0 in | Wt 230.8 lb

## 2016-01-10 DIAGNOSIS — F332 Major depressive disorder, recurrent severe without psychotic features: Secondary | ICD-10-CM

## 2016-01-10 DIAGNOSIS — F1994 Other psychoactive substance use, unspecified with psychoactive substance-induced mood disorder: Secondary | ICD-10-CM

## 2016-01-10 DIAGNOSIS — F1010 Alcohol abuse, uncomplicated: Secondary | ICD-10-CM

## 2016-01-10 MED ORDER — GABAPENTIN 100 MG PO CAPS: 200.0000 mg | ORAL_CAPSULE | Freq: Three times a day (TID) | ORAL | Status: DC

## 2016-01-10 MED ORDER — ESCITALOPRAM OXALATE 20 MG PO TABS: 20.0000 mg | ORAL_TABLET | Freq: Two times a day (BID) | ORAL | Status: DC

## 2016-01-10 NOTE — Progress Notes (Signed)
Patient ID: Kevin [name], male   DOB: 03/26/1946, 70 y.o.   MRN: [id number] Patient ID: Lynne Vallas, male   DOB: 31-Dec-1945, 70 y.o.   MRN: [id number] Patient ID: Leith Search, male   DOB: 04/18/1946, 70 y.o.   MRN: [id number] Patient ID: Havyn Rohs, male   DOB: 1946-04-26, 70 y.o.   MRN: [id number] Patient ID: Gaylor Rawlings, male   DOB: 01-Aug-1946, 70 y.o.   MRN: [id number] Patient ID: Quillian Varble, male   DOB: 11/04/1946, 70 y.o.   MRN: [id number]  Psychiatric Assessment Adult  Patient Identification:  Blayn Scarcella Date of Evaluation:  01/10/2016 Chief Complaint: I'm doing ok History of Chief Complaint:   Chief Complaint  Patient presents with  . Depression  . Anxiety  . Follow-up    Depression        Associated symptoms include myalgias.  Past medical history includes anxiety.   Anxiety Symptoms include nervous/anxious behavior.    Alcohol Problem   this patient is a 70 year old single black male who lives alone in [place]. He has no children. 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  The patient was referred by the behavioral health hospital where he was hospitalized from February 4 through the 18th. He was there to detox off alcohol, benzodiazepines and narcotic pills. He also has a history of depression. This is his third hospitalization, having been previously hospitalized in 2012 and 2008 for the same issues. He's never really attended much counseling or AA even all this was recommended in the past.  The patient states that his depression began when he was much younger although he doesn't have any particular reason to feel depressed. He never sought out any treatment while he worked. He retired about 10 years ago and then began to drink more heavily to deal with his low mood and anxiety. Over the last few weeks before admission he was drinking about a fifth of liquor a day. He was not eating and was very  depressed. He admits that he use some hydrocodone because of the pain in his hand due to arthritis but these were not prescribed to him. His drug screen was also positive for benzodiazepines but he claims he did not use these.  While in the hospital the patient went through a detox protocol. He was started on Lexapro and Neurontin. He is also on hydroxyzine but stopped it because it didn't help. He also stopped trazodone because it made him unable to walk. He states that he would like to switch his antidepressant to Lexapro because one of his friends has had a great result with it and I told him we could give it a try. He has not drank since he got out of the hospital or use any drugs. He is spending time with his family. 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 He's never been suicidal or had psychotic symptoms. He is sleeping well.  The patient returns after 4 months. He claims that he ran out of his Lexapro and gabapentin even though I gave him a 90 day supply with 2 refills. He seems to be very confused about his medicine. He states that sometimes he takes more than prescribed of his Lexapro and I warned him not to do this because it really want help. He's not had any further urges to drink but he did have a couple of beers at the [employer]. Other than that he has not been drinking or using drugs. He is staying busy and active Review of Systems  Constitutional: Negative.   HENT: Negative.   Eyes: Negative.   Respiratory: Negative.   Cardiovascular: Negative.   Gastrointestinal: Negative.   Endocrine: Negative.   Genitourinary: Negative.   Musculoskeletal: Positive for myalgias and arthralgias.  Allergic/Immunologic: Negative.   Neurological: Negative.   Hematological: Negative.   Psychiatric/Behavioral: Positive for depression and dysphoric mood. The patient is nervous/anxious.    Physical Exam  Depressive Symptoms: depressed mood, anhedonia, psychomotor  retardation, hopelessness, anxiety,  (Hypo) Manic Symptoms:   Elevated Mood:  No Irritable Mood:  No Grandiosity:  No Distractibility:  No Labiality of Mood:  No Delusions:  No Hallucinations:  No Impulsivity:  No Sexually Inappropriate Behavior:  No Financial Extravagance:  No Flight of Ideas:  No  Anxiety Symptoms: Excessive Worry:  Yes Panic Symptoms:  Yes Agoraphobia:  No Obsessive Compulsive: No  Symptoms: None, Specific Phobias:  No Social Anxiety:  No  Psychotic Symptoms:  Hallucinations: No None Delusions:  No Paranoia:  No   Ideas of Reference:  No  PTSD Symptoms: Ever had a traumatic exposure:  No Had a traumatic exposure in the last month:  No Re-experiencing: No None Hypervigilance:  No Hyperarousal: No None Avoidance: No None  Traumatic Brain Injury: No  Past Psychiatric History: Diagnosis: Alcohol dependence, depression   Hospitalizations: Behavioral health hospital last month and in 2008 and 2012   Outpatient Care: None   Substance Abuse Care: Same as hospitalizations   Self-Mutilation: None   Suicidal Attempts: None   Violent Behaviors: None    Past Medical History:   Past Medical History  Diagnosis Date  . Arthritis   . Gout   . Alcohol abuse   . Hypertension   . Hyperthyroidism   . Diabetes mellitus without complication ([place])   . GERD (gastroesophageal reflux disease)   . Diabetes mellitus, type II ([place])    History of Loss of Consciousness:  No Seizure History:  No Cardiac History:  No Allergies:   Allergies  Allergen Reactions  . Trazodone And Nefazodone     Unable to walk   Current Medications:  Current Outpatient Prescriptions  Medication Sig Dispense Refill  . colchicine 0.6 MG tablet Take 1 tablet (0.6 mg total) by mouth daily. For gout arthritis    . escitalopram (LEXAPRO) 20 MG tablet Take 1 tablet (20 mg total) by mouth 2 (two) times daily. 180 tablet 2  . gabapentin (NEURONTIN) 100 MG capsule Take 2 capsules (200  mg total) by mouth 3 (three) times daily. For substance withdrawal syndrome, anxiety/pain 180 capsule 2  . lansoprazole (PREVACID) 30 MG capsule Take 1 capsule (30 mg total) by mouth daily. For acid reflux (Patient taking differently: Take 30 mg by mouth daily as needed. For acid reflux)    . levothyroxine (SYNTHROID, LEVOTHROID) 100 MCG tablet Take 1 tablet (100 mcg total) by mouth daily. For thyroid hormone replacement    . meloxicam (MOBIC) 15 MG tablet Take 15 mg by mouth daily as needed.  2  . metFORMIN (GLUMETZA) 1000 MG (MOD) 24 hr tablet Take 1 tablet (1,000 mg total) by mouth daily with breakfast. For diabetes    . Olmesartan-Amlodipine-HCTZ (TRIBENZOR) 40-10-12.5 MG TABS Take 1 tablet by mouth daily. For hypertension 30 tablet    No current facility-administered medications for this visit.    Previous Psychotropic Medications:  Medication Dose  Substance Abuse History in the last 12 months: Substance Age of 1st Use Last Use Amount Specific Type  Nicotine      Alcohol    was drinking a fifth of liquor each day but stopped during hospitalization last month    Cannabis      Opiates    was using hydrocodone occasionally but stopped during hospitalization last month    Cocaine      Methamphetamines      LSD      Ecstasy      Benzodiazepines    this showed up on his drug screen but he denies using it    Caffeine      Inhalants      Others:                          Medical Consequences of Substance Abuse: Hospitalization needed for detox Legal Consequences of Substance Abuse: 1 DUI 35 years of  Family Consequences of Substance Abuse: Unknown Blackouts:  Yes DT's:  No Withdrawal Symptoms:  No None  Social History: Current Place of Residence: [place] of Birth: [place] Family Members 2 living sisters, several nieces and nephews Marital Status:  Single Children: none   Relationships: Has a girlfriend Education:  HS  Soil scientist Problems/Performance:  Religious Beliefs/Practices: Christian History of Abuse: none Pensions consultant; 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 Legal History: 1 DUI 35 years ago Hobbies/Interests: Watching sports on TV  Family History:   Family History  Problem Relation Age of Onset  . Alcohol abuse Paternal Aunt   . Drug abuse Paternal Uncle   . Alcohol abuse Paternal Uncle     Mental Status Examination/Evaluation: Objective:  Appearance: Casual and Fairly Groomed  Eye Contact::  Good  Speech:  Clear and Coherent  Volume:  Normal  Mood:  Good today   Affect:  Full Range  Thought Process:  Goal Directed  Orientation:  Full (Time, Place, and Person)  Thought Content:  WDL  Suicidal Thoughts:  No  Homicidal Thoughts:  No  Judgement:  Fair  Insight:  Fair  Psychomotor Activity:  Normal  Akathisia:  No  Handed:  Right  AIMS (if indicated):    Assets:  Communication Skills Desire for Improvement    Laboratory/X-Ray Psychological Evaluation(s)   Reviewed from hospital stay      Assessment:  Axis I: Alcohol Abuse and Major Depression, Recurrent severe  AXIS I Alcohol Abuse and Major Depression, Recurrent severe  AXIS II Deferred  AXIS III Past Medical History  Diagnosis Date  . Arthritis   . Gout   . Alcohol abuse   . Hypertension   . Hyperthyroidism   . Diabetes mellitus without complication ([place])   . GERD (gastroesophageal reflux disease)   . Diabetes mellitus, type II ([place])      AXIS IV other psychosocial or environmental problems  AXIS V 61-70 mild symptoms   Treatment Plan/Recommendations:  Plan of Care: Medication management   Laboratory:  Psychotherapy: The patient will be assigned a counselor here   Medications: He will continue Neurontin 100-200 mg 3 times a da y and Lexapro  20 mg twice a day  for mood stabilization   Routine PRN Medications:  No  Consultations:   Safety Concerns: He denies  thoughts of harm to self or others   Other: He will return in 3 months    Levonne Spiller, MD 2/10/20171:23 PM

## 2016-03-10 DIAGNOSIS — R7309 Other abnormal glucose: Secondary | ICD-10-CM | POA: Diagnosis not present

## 2016-03-10 DIAGNOSIS — I10 Essential (primary) hypertension: Secondary | ICD-10-CM | POA: Diagnosis not present

## 2016-03-10 DIAGNOSIS — E782 Mixed hyperlipidemia: Secondary | ICD-10-CM | POA: Diagnosis not present

## 2016-03-10 DIAGNOSIS — E039 Hypothyroidism, unspecified: Secondary | ICD-10-CM | POA: Diagnosis not present

## 2016-03-10 DIAGNOSIS — M1000 Idiopathic gout, unspecified site: Secondary | ICD-10-CM | POA: Diagnosis not present

## 2016-03-10 DIAGNOSIS — R3915 Urgency of urination: Secondary | ICD-10-CM | POA: Diagnosis not present

## 2016-03-30 ENCOUNTER — Other Ambulatory Visit (HOSPITAL_COMMUNITY): Payer: Self-pay | Admitting: Psychiatry

## 2016-04-08 ENCOUNTER — Ambulatory Visit (INDEPENDENT_AMBULATORY_CARE_PROVIDER_SITE_OTHER): Payer: Medicare Other | Admitting: Psychiatry

## 2016-04-08 ENCOUNTER — Encounter (HOSPITAL_COMMUNITY): Payer: Self-pay | Admitting: Psychiatry

## 2016-04-08 VITALS — BP 176/85 | HR 41 | Ht 73.0 in | Wt 245.0 lb

## 2016-04-08 DIAGNOSIS — F1994 Other psychoactive substance use, unspecified with psychoactive substance-induced mood disorder: Secondary | ICD-10-CM | POA: Diagnosis not present

## 2016-04-08 MED ORDER — GABAPENTIN 100 MG PO CAPS: 200.0000 mg | ORAL_CAPSULE | Freq: Three times a day (TID) | ORAL | Status: DC

## 2016-04-08 MED ORDER — ESCITALOPRAM OXALATE 20 MG PO TABS: 20.0000 mg | ORAL_TABLET | Freq: Two times a day (BID) | ORAL | Status: DC

## 2016-04-08 NOTE — Progress Notes (Signed)
Patient ID: Kevin [name], male   DOB: 08/13/1946, 70 y.o.   MRN: [id number] Patient ID: Kevin [name], male   DOB: 09-Apr-1946, 70 y.o.   MRN: [id number] Patient ID: Kevin [name], male   DOB: 1946/03/03, 70 y.o.   MRN: [id number] Patient ID: Kevin [name], male   DOB: 07-14-1946, 70 y.o.   MRN: [id number] Patient ID: Kevin [name], male   DOB: 26-Apr-1946, 70 y.o.   MRN: [id number] Patient ID: Kevin [name], male   DOB: 02-11-1946, 70 y.o.   MRN: [id number] Patient ID: Kevin [name], male   DOB: May 18, 1946, 70 y.o.   MRN: [id number]  Psychiatric Assessment Adult  Patient Identification:  Kevin [name] Date of Evaluation:  04/08/2016 Chief Complaint: I'm doing ok History of Chief Complaint:   Chief Complaint  Patient presents with  . Depression  . Anxiety  . Follow-up    Depression        Associated symptoms include myalgias.  Past medical history includes anxiety.   Anxiety Symptoms include nervous/anxious behavior.    Alcohol Problem   this patient is a 70 year old single black male who lives alone in [place]. He has no children. 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  The patient was referred by the behavioral health hospital where he was hospitalized from February 4 through the 18th. He was there to detox off alcohol, benzodiazepines and narcotic pills. He also has a history of depression. This is his third hospitalization, having been previously hospitalized in 2012 and 2008 for the same issues. He's never really attended much counseling or AA even all this was recommended in the past.  The patient states that his depression began when he was much younger although he doesn't have any particular reason to feel depressed. He never sought out any treatment while he worked. He retired about 10 years ago and then began to drink more heavily to deal with his low mood and anxiety. Over the last few weeks before admission he was  drinking about a fifth of liquor a day. He was not eating and was very depressed. He admits that he use some hydrocodone because of the pain in his hand due to arthritis but these were not prescribed to him. His drug screen was also positive for benzodiazepines but he claims he did not use these.  While in the hospital the patient went through a detox protocol. He was started on Lexapro and Neurontin. He is also on hydroxyzine but stopped it because it didn't help. He also stopped trazodone because it made him unable to walk. He states that he would like to switch his antidepressant to Lexapro because one of his friends has had a great result with it and I told him we could give it a try. He has not drank since he got out of the hospital or use any drugs. He is spending time with his family. 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 He's never been suicidal or had psychotic symptoms. He is sleeping well.  The patient returns after 4 months. He states that he is doing well and tries to tell truck part-time. His mood is been good and he has been sleeping well at night. He denies use of any drugs or alcohol. Sometimes he uses more gabapentin and prescribed any runs out early and I warned him not to do this. He does not have any specific complaints today  Review of Systems  Constitutional: Negative.   HENT: Negative.   Eyes: Negative.   Respiratory: Negative.   Cardiovascular: Negative.   Gastrointestinal: Negative.   Endocrine: Negative.   Genitourinary: Negative.   Musculoskeletal: Positive for myalgias and arthralgias.  Allergic/Immunologic: Negative.   Neurological: Negative.   Hematological: Negative.   Psychiatric/Behavioral: Positive for depression and dysphoric mood. The patient is nervous/anxious.    Physical Exam  Depressive Symptoms: depressed mood, anhedonia, psychomotor retardation, hopelessness, anxiety,  (Hypo) Manic Symptoms:   Elevated Mood:   No Irritable Mood:  No Grandiosity:  No Distractibility:  No Labiality of Mood:  No Delusions:  No Hallucinations:  No Impulsivity:  No Sexually Inappropriate Behavior:  No Financial Extravagance:  No Flight of Ideas:  No  Anxiety Symptoms: Excessive Worry:  Yes Panic Symptoms:  Yes Agoraphobia:  No Obsessive Compulsive: No  Symptoms: None, Specific Phobias:  No Social Anxiety:  No  Psychotic Symptoms:  Hallucinations: No None Delusions:  No Paranoia:  No   Ideas of Reference:  No  PTSD Symptoms: Ever had a traumatic exposure:  No Had a traumatic exposure in the last month:  No Re-experiencing: No None Hypervigilance:  No Hyperarousal: No None Avoidance: No None  Traumatic Brain Injury: No  Past Psychiatric History: Diagnosis: Alcohol dependence, depression   Hospitalizations: Behavioral health hospital last month and in 2008 and 2012   Outpatient Care: None   Substance Abuse Care: Same as hospitalizations   Self-Mutilation: None   Suicidal Attempts: None   Violent Behaviors: None    Past Medical History:   Past Medical History  Diagnosis Date  . Arthritis   . Gout   . Alcohol abuse   . Hypertension   . Hyperthyroidism   . Diabetes mellitus without complication ([place])   . GERD (gastroesophageal reflux disease)   . Diabetes mellitus, type II ([place])    History of Loss of Consciousness:  No Seizure History:  No Cardiac History:  No Allergies:   Allergies  Allergen Reactions  . Trazodone And Nefazodone     Unable to walk   Current Medications:  Current Outpatient Prescriptions  Medication Sig Dispense Refill  . colchicine 0.6 MG tablet Take 1 tablet (0.6 mg total) by mouth daily. For gout arthritis    . escitalopram (LEXAPRO) 20 MG tablet Take 1 tablet (20 mg total) by mouth 2 (two) times daily. 180 tablet 2  . gabapentin (NEURONTIN) 100 MG capsule Take 2 capsules (200 mg total) by mouth 3 (three) times daily. For substance withdrawal syndrome,  anxiety/pain 180 capsule 2  . lansoprazole (PREVACID) 30 MG capsule Take 1 capsule (30 mg total) by mouth daily. For acid reflux (Patient taking differently: Take 30 mg by mouth daily as needed. For acid reflux)    . levothyroxine (SYNTHROID, LEVOTHROID) 100 MCG tablet Take 1 tablet (100 mcg total) by mouth daily. For thyroid hormone replacement    . meloxicam (MOBIC) 15 MG tablet Take 15 mg by mouth daily as needed.  2  . metFORMIN (GLUMETZA) 1000 MG (MOD) 24 hr tablet Take 1 tablet (1,000 mg total) by mouth daily with breakfast. For diabetes    . Olmesartan-Amlodipine-HCTZ (TRIBENZOR) 40-10-12.5 MG TABS Take 1 tablet by mouth daily. For hypertension 30 tablet    No current facility-administered medications for this visit.    Previous Psychotropic Medications:  Medication Dose  Substance Abuse History in the last 12 months: Substance Age of 1st Use Last Use Amount Specific Type  Nicotine      Alcohol    was drinking a fifth of liquor each day but stopped during hospitalization last month    Cannabis      Opiates    was using hydrocodone occasionally but stopped during hospitalization last month    Cocaine      Methamphetamines      LSD      Ecstasy      Benzodiazepines    this showed up on his drug screen but he denies using it    Caffeine      Inhalants      Others:                          Medical Consequences of Substance Abuse: Hospitalization needed for detox Legal Consequences of Substance Abuse: 1 DUI 35 years of  Family Consequences of Substance Abuse: Unknown Blackouts:  Yes DT's:  No Withdrawal Symptoms:  No None  Social History: Current Place of Residence: [place] of Birth: [place] Family Members 2 living sisters, several nieces and nephews Marital Status:  Single Children: none   Relationships: Has a girlfriend Education:  HS Soil scientist Problems/Performance:  Religious Beliefs/Practices:  Christian History of Abuse: none Pensions consultant; 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 Legal History: 1 DUI 35 years ago Hobbies/Interests: Watching sports on TV  Family History:   Family History  Problem Relation Age of Onset  . Alcohol abuse Paternal Aunt   . Drug abuse Paternal Uncle   . Alcohol abuse Paternal Uncle     Mental Status Examination/Evaluation: Objective:  Appearance: Casual and Fairly Groomed  Eye Contact::  Good  Speech:  Clear and Coherent  Volume:  Normal  Mood:  Good today   Affect:  Full Range  Thought Process:  Goal Directed  Orientation:  Full (Time, Place, and Person)  Thought Content:  WDL  Suicidal Thoughts:  No  Homicidal Thoughts:  No  Judgement:  Fair  Insight:  Fair  Psychomotor Activity:  Normal  Akathisia:  No  Handed:  Right  AIMS (if indicated):    Assets:  Communication Skills Desire for Improvement    Laboratory/X-Ray Psychological Evaluation(s)   Reviewed from hospital stay      Assessment:  Axis I: Alcohol Abuse and Major Depression, Recurrent severe  AXIS I Alcohol Abuse and Major Depression, Recurrent severe  AXIS II Deferred  AXIS III Past Medical History  Diagnosis Date  . Arthritis   . Gout   . Alcohol abuse   . Hypertension   . Hyperthyroidism   . Diabetes mellitus without complication ([place])   . GERD (gastroesophageal reflux disease)   . Diabetes mellitus, type II ([place])      AXIS IV other psychosocial or environmental problems  AXIS V 61-70 mild symptoms   Treatment Plan/Recommendations:  Plan of Care: Medication management   Laboratory:  Psychotherapy: The patient Does not feel that he needs counseling at this time   Medications: He will continue Neurontin 100-200 mg 3 times a day and Lexapro  20 mg twice a day  for mood stabilization   Routine PRN Medications:  No  Consultations:   Safety Concerns: He denies thoughts of harm to self or others   Other: He will return  in 4 months    ROSS, Neoma Laming, MD  5/10/20171:30 PM

## 2016-07-08 DIAGNOSIS — E782 Mixed hyperlipidemia: Secondary | ICD-10-CM | POA: Diagnosis not present

## 2016-07-08 DIAGNOSIS — M1000 Idiopathic gout, unspecified site: Secondary | ICD-10-CM | POA: Diagnosis not present

## 2016-07-08 DIAGNOSIS — E039 Hypothyroidism, unspecified: Secondary | ICD-10-CM | POA: Diagnosis not present

## 2016-07-08 DIAGNOSIS — I10 Essential (primary) hypertension: Secondary | ICD-10-CM | POA: Diagnosis not present

## 2016-07-08 DIAGNOSIS — R7309 Other abnormal glucose: Secondary | ICD-10-CM | POA: Diagnosis not present

## 2016-07-08 DIAGNOSIS — H44519 Absolute glaucoma, unspecified eye: Secondary | ICD-10-CM | POA: Diagnosis not present

## 2016-08-06 ENCOUNTER — Ambulatory Visit (INDEPENDENT_AMBULATORY_CARE_PROVIDER_SITE_OTHER): Payer: Medicare Other | Admitting: Psychiatry

## 2016-08-06 ENCOUNTER — Encounter (HOSPITAL_COMMUNITY): Payer: Self-pay | Admitting: Psychiatry

## 2016-08-06 VITALS — BP 121/69 | HR 54 | Ht 73.0 in | Wt 219.2 lb

## 2016-08-06 DIAGNOSIS — F1994 Other psychoactive substance use, unspecified with psychoactive substance-induced mood disorder: Secondary | ICD-10-CM

## 2016-08-06 DIAGNOSIS — F1010 Alcohol abuse, uncomplicated: Secondary | ICD-10-CM | POA: Diagnosis not present

## 2016-08-06 MED ORDER — ESCITALOPRAM OXALATE 20 MG PO TABS: 20.0000 mg | ORAL_TABLET | Freq: Two times a day (BID) | ORAL | 2 refills | Status: DC

## 2016-08-06 MED ORDER — GABAPENTIN 100 MG PO CAPS: 200.0000 mg | ORAL_CAPSULE | Freq: Three times a day (TID) | ORAL | 2 refills | Status: DC

## 2016-08-06 NOTE — Progress Notes (Signed)
Patient ID: Kevin [name], male   DOB: October 27, 1946, 70 y.o.   MRN: [id number] Patient ID: Kevin [name], male   DOB: 1945/12/28, 70 y.o.   MRN: [id number] Patient ID: Kevin [name], male   DOB: 06-03-1946, 70 y.o.   MRN: [id number] Patient ID: Kevin [name], male   DOB: [birth year], 70 y.o.   MRN: [id number] Patient ID: Kevin [name], male   DOB: 10/09/1946, 70 y.o.   MRN: [id number] Patient ID: Kevin [name], male   DOB: November 13, 1946, 70 y.o.   MRN: [id number] Patient ID: Kevin [name], male   DOB: Jun 11, 1946, 70 y.o.   MRN: [id number]  Psychiatric Assessment Adult  Patient Identification:  Kevin [name] Date of Evaluation:  08/06/2016 Chief Complaint: I'm doing ok History of Chief Complaint:   Chief Complaint  Patient presents with  . Depression  . Follow-up    Depression         Associated symptoms include myalgias.  Past medical history includes anxiety.   Anxiety  Symptoms include nervous/anxious behavior.    Alcohol Problem    this patient is a 70 year old single black male who lives alone in [place]. He has no children. 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  The patient was referred by the behavioral health hospital where he was hospitalized from February 4 through the 18th. He was there to detox off alcohol, benzodiazepines and narcotic pills. He also has a history of depression. This is his third hospitalization, having been previously hospitalized in 2012 and 2008 for the same issues. He's never really attended much counseling or AA even all this was recommended in the past.  The patient states that his depression began when he was much younger although he doesn't have any particular reason to feel depressed. He never sought out any treatment while he worked. He retired about 10 years ago and then began to drink more heavily to deal with his low mood and anxiety. Over the last few weeks before admission he was drinking  about a fifth of liquor a day. He was not eating and was very depressed. He admits that he use some hydrocodone because of the pain in his hand due to arthritis but these were not prescribed to him. His drug screen was also positive for benzodiazepines but he claims he did not use these.  While in the hospital the patient went through a detox protocol. He was started on Lexapro and Neurontin. He is also on hydroxyzine but stopped it because it didn't help. He also stopped trazodone because it made him unable to walk. He states that he would like to switch his antidepressant to Lexapro because one of his friends has had a great result with it and I told him we could give it a try. He has not drank since he got out of the hospital or use any drugs. He is spending time with his family. 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 He's never been suicidal or had psychotic symptoms. He is sleeping well.  The patient returns after 4 months. 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 He is not drinking at all his mood is been great and he is sleeping well and eating well. He has lost about 20 pounds through dieting. He does not have any specific complaints today and thinks his medications have been very helpful Review of Systems  Constitutional: Negative.   HENT: Negative.   Eyes: Negative.   Respiratory: Negative.   Cardiovascular: Negative.   Gastrointestinal: Negative.   Endocrine: Negative.   Genitourinary: Negative.   Musculoskeletal: Positive for arthralgias and myalgias.  Allergic/Immunologic: Negative.   Neurological: Negative.   Hematological: Negative.   Psychiatric/Behavioral: Positive for depression and dysphoric mood. The patient is nervous/anxious.    Physical Exam  Depressive Symptoms: depressed mood, anhedonia, psychomotor retardation, hopelessness, anxiety,  (Hypo) Manic Symptoms:   Elevated Mood:  No Irritable Mood:   No Grandiosity:  No Distractibility:  No Labiality of Mood:  No Delusions:  No Hallucinations:  No Impulsivity:  No Sexually Inappropriate Behavior:  No Financial Extravagance:  No Flight of Ideas:  No  Anxiety Symptoms: Excessive Worry:  Yes Panic Symptoms:  Yes Agoraphobia:  No Obsessive Compulsive: No  Symptoms: None, Specific Phobias:  No Social Anxiety:  No  Psychotic Symptoms:  Hallucinations: No None Delusions:  No Paranoia:  No   Ideas of Reference:  No  PTSD Symptoms: Ever had a traumatic exposure:  No Had a traumatic exposure in the last month:  No Re-experiencing: No None Hypervigilance:  No Hyperarousal: No None Avoidance: No None  Traumatic Brain Injury: No  Past Psychiatric History: Diagnosis: Alcohol dependence, depression   Hospitalizations: Behavioral health hospital last month and in 2008 and 2012   Outpatient Care: None   Substance Abuse Care: Same as hospitalizations   Self-Mutilation: None   Suicidal Attempts: None   Violent Behaviors: None    Past Medical History:   Past Medical History:  Diagnosis Date  . Alcohol abuse   . Arthritis   . Diabetes mellitus without complication ([place])   . Diabetes mellitus, type II ([place])   . GERD (gastroesophageal reflux disease)   . Gout   . Hypertension   . Hyperthyroidism    History of Loss of Consciousness:  No Seizure History:  No Cardiac History:  No Allergies:   Allergies  Allergen Reactions  . Trazodone And Nefazodone     Unable to walk   Current Medications:  Current Outpatient Prescriptions  Medication Sig Dispense Refill  . colchicine 0.6 MG tablet Take 1 tablet (0.6 mg total) by mouth daily. For gout arthritis    . escitalopram (LEXAPRO) 20 MG tablet Take 1 tablet (20 mg total) by mouth 2 (two) times daily. 180 tablet 2  . gabapentin (NEURONTIN) 100 MG capsule Take 2 capsules (200 mg total) by mouth 3 (three) times daily. For substance withdrawal syndrome, anxiety/pain 180 capsule 2   . lansoprazole (PREVACID) 30 MG capsule Take 1 capsule (30 mg total) by mouth daily. For acid reflux (Patient taking differently: Take 30 mg by mouth daily as needed. For acid reflux)    . levothyroxine (SYNTHROID, LEVOTHROID) 100 MCG tablet Take 1 tablet (100 mcg total) by mouth daily. For thyroid hormone replacement    . meloxicam (MOBIC) 15 MG tablet Take 15 mg by mouth daily as needed.  2  . metFORMIN (GLUMETZA) 1000 MG (MOD) 24 hr tablet Take 1 tablet (1,000 mg total) by mouth daily with breakfast. For diabetes    . Olmesartan-Amlodipine-HCTZ (TRIBENZOR) 40-10-12.5 MG TABS Take 1 tablet by mouth daily. For hypertension 30 tablet    No current facility-administered medications for this visit.     Previous Psychotropic Medications:  Medication Dose                          Substance Abuse  History in the last 12 months: Substance Age of 1st Use Last Use Amount Specific Type  Nicotine      Alcohol    was drinking a fifth of liquor each day but stopped during hospitalization last month    Cannabis      Opiates    was using hydrocodone occasionally but stopped during hospitalization last month    Cocaine      Methamphetamines      LSD      Ecstasy      Benzodiazepines    this showed up on his drug screen but he denies using it    Caffeine      Inhalants      Others:                          Medical Consequences of Substance Abuse: Hospitalization needed for detox Legal Consequences of Substance Abuse: 1 DUI 35 years of  Family Consequences of Substance Abuse: Unknown Blackouts:  Yes DT's:  No Withdrawal Symptoms:  No None  Social History: Current Place of Residence: [place] of Birth: [place] Family Members 2 living sisters, several nieces and nephews Marital Status:  Single Children: none   Relationships: Has a girlfriend Education:  HS Soil scientist Problems/Performance:  Religious Beliefs/Practices: Christian History of Abuse:  none Pensions consultant; 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 Legal History: 1 DUI 35 years ago Hobbies/Interests: Watching sports on TV  Family History:   Family History  Problem Relation Age of Onset  . Alcohol abuse Paternal Aunt   . Drug abuse Paternal Uncle   . Alcohol abuse Paternal Uncle     Mental Status Examination/Evaluation: Objective:  Appearance: Casual and Fairly Groomed  Eye Contact::  Good  Speech:  Clear and Coherent  Volume:  Normal  Mood:  Good today   Affect:  Full Range  Thought Process:  Goal Directed  Orientation:  Full (Time, Place, and Person)  Thought Content:  WDL  Suicidal Thoughts:  No  Homicidal Thoughts:  No  Judgement:  Fair  Insight:  Fair  Psychomotor Activity:  Normal  Akathisia:  No  Handed:  Right  AIMS (if indicated):    Assets:  Communication Skills Desire for Improvement    Laboratory/X-Ray Psychological Evaluation(s)   Reviewed from hospital stay      Assessment:  Axis I: Alcohol Abuse and Major Depression, Recurrent severe  AXIS I Alcohol Abuse and Major Depression, Recurrent severe  AXIS II Deferred  AXIS III Past Medical History:  Diagnosis Date  . Alcohol abuse   . Arthritis   . Diabetes mellitus without complication ([place])   . Diabetes mellitus, type II ([place])   . GERD (gastroesophageal reflux disease)   . Gout   . Hypertension   . Hyperthyroidism      AXIS IV other psychosocial or environmental problems  AXIS V 61-70 mild symptoms   Treatment Plan/Recommendations:  Plan of Care: Medication management   Laboratory:  Psychotherapy: The patient Does not feel that he needs counseling at this time   Medications: He will continue Neurontin 100-200 mg 3 times a day and Lexapro  20 mg twice a day  for mood stabilization   Routine PRN Medications:  No  Consultations:   Safety Concerns: He denies thoughts of harm to self or others   Other: He will return in 4 months    Levonne Spiller, MD 9/7/201710:43 AM

## 2016-11-19 DIAGNOSIS — E039 Hypothyroidism, unspecified: Secondary | ICD-10-CM | POA: Diagnosis not present

## 2016-11-19 DIAGNOSIS — I10 Essential (primary) hypertension: Secondary | ICD-10-CM | POA: Diagnosis not present

## 2016-11-19 DIAGNOSIS — E782 Mixed hyperlipidemia: Secondary | ICD-10-CM | POA: Diagnosis not present

## 2016-11-19 DIAGNOSIS — R7309 Other abnormal glucose: Secondary | ICD-10-CM | POA: Diagnosis not present

## 2016-11-19 DIAGNOSIS — H44519 Absolute glaucoma, unspecified eye: Secondary | ICD-10-CM | POA: Diagnosis not present

## 2016-12-03 ENCOUNTER — Ambulatory Visit (HOSPITAL_COMMUNITY): Payer: Self-pay | Admitting: Psychiatry

## 2016-12-03 ENCOUNTER — Encounter (HOSPITAL_COMMUNITY): Payer: Self-pay

## 2017-03-31 DIAGNOSIS — E039 Hypothyroidism, unspecified: Secondary | ICD-10-CM | POA: Diagnosis not present

## 2017-03-31 DIAGNOSIS — I10 Essential (primary) hypertension: Secondary | ICD-10-CM | POA: Diagnosis not present

## 2017-03-31 DIAGNOSIS — R7309 Other abnormal glucose: Secondary | ICD-10-CM | POA: Diagnosis not present

## 2017-03-31 DIAGNOSIS — H44519 Absolute glaucoma, unspecified eye: Secondary | ICD-10-CM | POA: Diagnosis not present

## 2017-05-24 ENCOUNTER — Other Ambulatory Visit (HOSPITAL_COMMUNITY): Payer: Self-pay | Admitting: Psychiatry

## 2017-06-25 ENCOUNTER — Other Ambulatory Visit: Payer: Self-pay

## 2017-07-21 ENCOUNTER — Telehealth (HOSPITAL_COMMUNITY): Payer: Self-pay | Admitting: *Deleted

## 2017-07-21 NOTE — Telephone Encounter (Signed)
Pt pharmacy [facility] in [place] requesting refills for pt Gabapentin 100 mg. Pt medication was last filled on 08-06-2017 with 180 caps 2 refills was pt last appt. Pt was scheduled to return for f/u and no showed on 12-03-2016. Called pt at [phone] to sch a f/u appt and pt nephew picked up pt is not around but he could have pt call office back. Provided pt nephew with office number.  Called pt at [phone] and phone kept ringing and there was no voicemail and no one picked up. Pt pharmacy number is [phone].

## 2017-07-21 NOTE — Telephone Encounter (Signed)
noted 

## 2017-07-21 NOTE — Telephone Encounter (Signed)
No refills until seen  

## 2017-07-23 NOTE — Telephone Encounter (Signed)
Called pt mobile number to sch appt and message came up stating pt mailbox was full and can not take any messages at this time.

## 2017-07-23 NOTE — Telephone Encounter (Signed)
Called pt home number to sch f/u appt with provider and message came up stating to please enter remote access code and there was no voicemail.

## 2017-10-09 ENCOUNTER — Other Ambulatory Visit (HOSPITAL_COMMUNITY): Payer: Self-pay | Admitting: Psychiatry

## 2017-10-28 ENCOUNTER — Telehealth (HOSPITAL_COMMUNITY): Payer: Self-pay | Admitting: *Deleted

## 2017-10-28 NOTE — Telephone Encounter (Signed)
returned phone call regarding an appointment, no answer, left voice message.

## 2017-10-29 ENCOUNTER — Telehealth (HOSPITAL_COMMUNITY): Payer: Self-pay | Admitting: Psychiatry

## 2017-10-29 ENCOUNTER — Other Ambulatory Visit (HOSPITAL_COMMUNITY): Payer: Self-pay | Admitting: Psychiatry

## 2017-10-29 DIAGNOSIS — M1000 Idiopathic gout, unspecified site: Secondary | ICD-10-CM | POA: Diagnosis not present

## 2017-10-29 DIAGNOSIS — I10 Essential (primary) hypertension: Secondary | ICD-10-CM | POA: Diagnosis not present

## 2017-10-29 DIAGNOSIS — E782 Mixed hyperlipidemia: Secondary | ICD-10-CM | POA: Diagnosis not present

## 2017-11-01 ENCOUNTER — Encounter (HOSPITAL_COMMUNITY): Payer: Self-pay | Admitting: Psychiatry

## 2017-11-01 ENCOUNTER — Ambulatory Visit (INDEPENDENT_AMBULATORY_CARE_PROVIDER_SITE_OTHER): Payer: Medicare Other | Admitting: Psychiatry

## 2017-11-01 VITALS — BP 120/74 | HR 58 | Ht 73.0 in | Wt 217.0 lb

## 2017-11-01 DIAGNOSIS — Z813 Family history of other psychoactive substance abuse and dependence: Secondary | ICD-10-CM

## 2017-11-01 DIAGNOSIS — F1994 Other psychoactive substance use, unspecified with psychoactive substance-induced mood disorder: Secondary | ICD-10-CM | POA: Diagnosis not present

## 2017-11-01 DIAGNOSIS — R450 Nervousness: Secondary | ICD-10-CM

## 2017-11-01 DIAGNOSIS — Z811 Family history of alcohol abuse and dependence: Secondary | ICD-10-CM | POA: Diagnosis not present

## 2017-11-01 DIAGNOSIS — F419 Anxiety disorder, unspecified: Secondary | ICD-10-CM | POA: Diagnosis not present

## 2017-11-01 MED ORDER — ESCITALOPRAM OXALATE 20 MG PO TABS: 20.0000 mg | ORAL_TABLET | Freq: Two times a day (BID) | ORAL | 2 refills | Status: DC

## 2017-11-01 MED ORDER — GABAPENTIN 100 MG PO CAPS: 200.0000 mg | ORAL_CAPSULE | Freq: Three times a day (TID) | ORAL | 2 refills | Status: DC

## 2017-11-01 NOTE — Progress Notes (Signed)
BH MD/PA/NP OP Progress Note  11/01/2017 2:54 PM Kevin [name]  MRN:  [id number]  Chief Complaint:  Chief Complaint    Anxiety; Depression; Follow-up     HPI: this patient is a 71 year old single black male who lives alone in [place]. He has no children. 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 He also has a history of depression. This is his third hospitalization, having been previously hospitalized in 2012 and 2008 for the same issues. He's never really attended much counseling or AA even all this was recommended in the past.  The patient states that his depression began when he was much younger although he doesn't have any particular reason to feel depressed. He never sought out any treatment while he worked. He retired about 10 years ago and then began to drink more heavily to deal with his low mood and anxiety. Over the last few weeks before admission he was drinking about a fifth of liquor a day. He was not eating and was very depressed. He admits that he use some hydrocodone because of the pain in his hand due to arthritis but these were not prescribed to him. His drug screen was also positive for benzodiazepines but he claims he did not use these.  While in the hospital the patient went through a detox protocol. He was started on Lexapro and Neurontin. He is also on hydroxyzine but stopped it because it didn't help. He also stopped trazodone because it made him unable to walk. He states that he would like to switch his antidepressant to Lexapro because one of his friends has had a great result with it and I told him we could give it a try. He has not drank since he got out of the hospital or use any drugs. He  is spending time with his family. 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 He's never been suicidal or had psychotic symptoms. He is sleeping well.  Patient returns after long absence.  He was last seen 14 months ago.  He states that he ran out of his gabapentin and Lexapro and he does not feel as good without them.  He has become more anxious although not seriously depressed and he is not drinking.  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  He is sleeping well but he just does not feel quite as good.  He denies any psychotic symptoms or suicidal ideation Visit Diagnosis:    ICD-10-CM   1. 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 Alcohol abuse   . Arthritis   . Diabetes mellitus without complication ([place])   . Diabetes mellitus, type II ([place])   . GERD (gastroesophageal reflux disease)   . Gout   . Hypertension   . Hyperthyroidism    History reviewed. No pertinent surgical history.  Family Psychiatric History: See below  Family History:  Family History  Problem Relation Age of Onset  . Alcohol abuse Paternal Aunt   . Drug abuse Paternal Uncle   .  Alcohol abuse Paternal Uncle     Social History:  Social History   Socioeconomic History  . Marital status: Single    Spouse name: None  . Number of children: None  . Years of education: None  . Highest education level: None  Social Needs  . Financial resource strain: None  . Food insecurity - worry: None  . Food insecurity - inability: None  . Transportation needs - medical: None  . Transportation needs - non-medical: None  Occupational History  . None  Tobacco Use  . Smoking status: Never Smoker  . Smokeless tobacco: Never Used  Substance and Sexual Activity  . Alcohol use: No    Comment: one fifth daily/ last used 5 weeks ago  . Drug use:  No  . Sexual activity: Yes  Other Topics Concern  . None  Social History Narrative  . None    Allergies:  Allergies  Allergen Reactions  . Trazodone And Nefazodone     Unable to walk    Metabolic Disorder Labs: Lab Results  Component Value Date   HGBA1C 5.7 (H) 01/04/2014   MPG 117 (H) 01/04/2014   MPG 123 (H) 01/24/2013   No results found for: PROLACTIN No results found for: CHOL, TRIG, HDL, CHOLHDL, VLDL, LDLCALC   Therapeutic Level Labs: No results found for: LITHIUM No results found for: VALPROATE No components found for:  CBMZ  Current Medications: Current Outpatient Medications  Medication Sig Dispense Refill  . colchicine 0.6 MG tablet Take 1 tablet (0.6 mg total) by mouth daily. For gout arthritis    . gabapentin (NEURONTIN) 100 MG capsule Take 2 capsules (200 mg total) by mouth 3 (three) times daily. For substance withdrawal syndrome, anxiety/pain 180 capsule 2  . lansoprazole (PREVACID) 30 MG capsule Take 1 capsule (30 mg total) by mouth daily. For acid reflux (Patient taking differently: Take 30 mg by mouth daily as needed. For acid reflux)    . levothyroxine (SYNTHROID, LEVOTHROID) 100 MCG tablet Take 1 tablet (100 mcg total) by mouth daily. For thyroid hormone replacement    . meloxicam (MOBIC) 15 MG tablet Take 15 mg by mouth daily as needed.  2  . metFORMIN (GLUMETZA) 1000 MG (MOD) 24 hr tablet Take 1 tablet (1,000 mg total) by mouth daily with breakfast. For diabetes    . Olmesartan-Amlodipine-HCTZ (TRIBENZOR) 40-10-12.5 MG TABS Take 1 tablet by mouth daily. For hypertension 30 tablet   . escitalopram (LEXAPRO) 20 MG tablet Take 1 tablet (20 mg total) by mouth 2 (two) times daily. 180 tablet 2   No current facility-administered medications for this visit.      Musculoskeletal: Strength & Muscle Tone: within normal limits Gait & Station: normal Patient leans: N/A  Psychiatric Specialty Exam: Review of Systems  Psychiatric/Behavioral: The patient is  nervous/anxious.   All other systems reviewed and are negative.   Blood pressure 120/74, pulse (!) 58, height 6\' 1"  (1.854 m), weight 217 lb (98.4 kg), SpO2 96 %.Body mass index is 28.63 kg/m.  General Appearance: Casual and Fairly Groomed  Eye Contact:  Good  Speech:  Clear and Coherent  Volume:  Normal  Mood:  Anxious  Affect:  Congruent  Thought Process:  Goal Directed  Orientation:  Full (Time, Place, and Person)  Thought Content: WDL   Suicidal Thoughts:  No  Homicidal Thoughts:  No  Memory:  Immediate;   Good Recent;   Fair Remote;   Fair  Judgement:  Fair  Insight:  Fair  Psychomotor  Activity:  Normal  Concentration:  Concentration: Good and Attention Span: Good  Recall:  Good  Fund of Knowledge: Good  Language: Good  Akathisia:  No  Handed:  Right  AIMS (if indicated): not done  Assets:  Communication Skills Desire for Improvement Physical Health Resilience Social Support Talents/Skills  ADL's:  Intact  Cognition: WNL  Sleep:  Good   Screenings: AUDIT     Admission (Discharged) from 01/03/2014 in [facility] Admission (Discharged) from 01/24/2013 in [facility]  Alcohol Use Disorder Identification Test Final Score (AUDIT)  34  33       Assessment and Plan: This patient is a 71 year old male with a history of substance-induced mood disorder.  Fortunately he is no longer drinking.  He ran out of his medication and is feeling a bit more anxious.  Therefore he will restart gabapentin 200 mg 3 times daily as well as Lexapro 20 mg 2 times daily.  At his request he will return to see me in 6 months   Levonne Spiller, MD 11/01/2017, 2:54 PM

## 2018-01-05 DIAGNOSIS — I10 Essential (primary) hypertension: Secondary | ICD-10-CM | POA: Diagnosis not present

## 2018-01-05 DIAGNOSIS — M1000 Idiopathic gout, unspecified site: Secondary | ICD-10-CM | POA: Diagnosis not present

## 2018-01-05 DIAGNOSIS — E782 Mixed hyperlipidemia: Secondary | ICD-10-CM | POA: Diagnosis not present

## 2018-01-25 ENCOUNTER — Other Ambulatory Visit (HOSPITAL_COMMUNITY): Payer: Self-pay | Admitting: Psychiatry

## 2018-03-12 ENCOUNTER — Emergency Department (HOSPITAL_COMMUNITY): Payer: Medicare Other

## 2018-03-12 ENCOUNTER — Inpatient Hospital Stay (HOSPITAL_COMMUNITY)
Admission: EM | Admit: 2018-03-12 | Discharge: 2018-03-14 | DRG: 378 | Disposition: A | Discharge status: Home / Self Care | Payer: Medicare Other | Attending: Internal Medicine | Admitting: Internal Medicine

## 2018-03-12 ENCOUNTER — Encounter (HOSPITAL_COMMUNITY)
Admission: EM | Disposition: A | Discharge status: Home / Self Care | Payer: Self-pay | Source: Home / Self Care | Attending: Internal Medicine

## 2018-03-12 ENCOUNTER — Encounter (HOSPITAL_COMMUNITY): Payer: Self-pay | Admitting: Emergency Medicine

## 2018-03-12 ENCOUNTER — Other Ambulatory Visit: Payer: Self-pay

## 2018-03-12 DIAGNOSIS — K625 Hemorrhage of anus and rectum: Secondary | ICD-10-CM | POA: Insufficient documentation

## 2018-03-12 DIAGNOSIS — N183 Chronic kidney disease, stage 3 unspecified: Secondary | ICD-10-CM

## 2018-03-12 DIAGNOSIS — K922 Gastrointestinal hemorrhage, unspecified: Secondary | ICD-10-CM | POA: Diagnosis not present

## 2018-03-12 DIAGNOSIS — K449 Diaphragmatic hernia without obstruction or gangrene: Secondary | ICD-10-CM | POA: Diagnosis not present

## 2018-03-12 DIAGNOSIS — K921 Melena: Secondary | ICD-10-CM | POA: Diagnosis not present

## 2018-03-12 DIAGNOSIS — K5731 Diverticulosis of large intestine without perforation or abscess with bleeding: Secondary | ICD-10-CM | POA: Diagnosis not present

## 2018-03-12 DIAGNOSIS — Z888 Allergy status to other drugs, medicaments and biological substances status: Secondary | ICD-10-CM

## 2018-03-12 DIAGNOSIS — K209 Esophagitis, unspecified: Secondary | ICD-10-CM

## 2018-03-12 DIAGNOSIS — N289 Disorder of kidney and ureter, unspecified: Secondary | ICD-10-CM | POA: Diagnosis not present

## 2018-03-12 DIAGNOSIS — Z79890 Hormone replacement therapy (postmenopausal): Secondary | ICD-10-CM

## 2018-03-12 DIAGNOSIS — K219 Gastro-esophageal reflux disease without esophagitis: Secondary | ICD-10-CM | POA: Diagnosis not present

## 2018-03-12 DIAGNOSIS — E1165 Type 2 diabetes mellitus with hyperglycemia: Secondary | ICD-10-CM

## 2018-03-12 DIAGNOSIS — F1994 Other psychoactive substance use, unspecified with psychoactive substance-induced mood disorder: Secondary | ICD-10-CM | POA: Diagnosis not present

## 2018-03-12 DIAGNOSIS — Z7901 Long term (current) use of anticoagulants: Secondary | ICD-10-CM | POA: Diagnosis not present

## 2018-03-12 DIAGNOSIS — M109 Gout, unspecified: Secondary | ICD-10-CM | POA: Diagnosis present

## 2018-03-12 DIAGNOSIS — K222 Esophageal obstruction: Secondary | ICD-10-CM | POA: Diagnosis present

## 2018-03-12 DIAGNOSIS — K210 Gastro-esophageal reflux disease with esophagitis: Secondary | ICD-10-CM | POA: Diagnosis not present

## 2018-03-12 DIAGNOSIS — I10 Essential (primary) hypertension: Secondary | ICD-10-CM | POA: Diagnosis not present

## 2018-03-12 DIAGNOSIS — K2210 Ulcer of esophagus without bleeding: Secondary | ICD-10-CM | POA: Diagnosis present

## 2018-03-12 DIAGNOSIS — K2951 Unspecified chronic gastritis with bleeding: Secondary | ICD-10-CM | POA: Diagnosis not present

## 2018-03-12 DIAGNOSIS — K5790 Diverticulosis of intestine, part unspecified, without perforation or abscess without bleeding: Secondary | ICD-10-CM | POA: Diagnosis not present

## 2018-03-12 DIAGNOSIS — R1010 Upper abdominal pain, unspecified: Secondary | ICD-10-CM | POA: Diagnosis not present

## 2018-03-12 DIAGNOSIS — E1122 Type 2 diabetes mellitus with diabetic chronic kidney disease: Secondary | ICD-10-CM | POA: Diagnosis not present

## 2018-03-12 DIAGNOSIS — I129 Hypertensive chronic kidney disease with stage 1 through stage 4 chronic kidney disease, or unspecified chronic kidney disease: Secondary | ICD-10-CM | POA: Diagnosis present

## 2018-03-12 DIAGNOSIS — F1010 Alcohol abuse, uncomplicated: Secondary | ICD-10-CM | POA: Diagnosis present

## 2018-03-12 DIAGNOSIS — E039 Hypothyroidism, unspecified: Secondary | ICD-10-CM | POA: Diagnosis present

## 2018-03-12 DIAGNOSIS — F329 Major depressive disorder, single episode, unspecified: Secondary | ICD-10-CM | POA: Diagnosis present

## 2018-03-12 DIAGNOSIS — Z8711 Personal history of peptic ulcer disease: Secondary | ICD-10-CM

## 2018-03-12 DIAGNOSIS — K319 Disease of stomach and duodenum, unspecified: Secondary | ICD-10-CM | POA: Diagnosis not present

## 2018-03-12 DIAGNOSIS — K3189 Other diseases of stomach and duodenum: Secondary | ICD-10-CM | POA: Diagnosis not present

## 2018-03-12 DIAGNOSIS — R109 Unspecified abdominal pain: Secondary | ICD-10-CM | POA: Diagnosis not present

## 2018-03-12 DIAGNOSIS — F1020 Alcohol dependence, uncomplicated: Secondary | ICD-10-CM | POA: Diagnosis present

## 2018-03-12 HISTORY — PX: ESOPHAGOGASTRODUODENOSCOPY: SHX5428

## 2018-03-12 HISTORY — PX: BIOPSY: SHX5522

## 2018-03-12 LAB — CBC WITH DIFFERENTIAL/PLATELET
Basophils Absolute: 0 10*3/uL (ref 0.0–0.1)
Basophils Relative: 0 %
EOS ABS: 0.2 10*3/uL (ref 0.0–0.7)
EOS PCT: 4 %
HCT: 38.4 % — ABNORMAL LOW (ref 39.0–52.0)
HEMOGLOBIN: 12.1 g/dL — AB (ref 13.0–17.0)
LYMPHS PCT: 27 %
Lymphs Abs: 1.5 10*3/uL (ref 0.7–4.0)
MCH: 28.9 pg (ref 26.0–34.0)
MCHC: 31.5 g/dL (ref 30.0–36.0)
MCV: 91.6 fL (ref 78.0–100.0)
MONOS PCT: 5 %
Monocytes Absolute: 0.3 10*3/uL (ref 0.1–1.0)
Neutro Abs: 3.7 10*3/uL (ref 1.7–7.7)
Neutrophils Relative %: 64 %
PLATELETS: 185 10*3/uL (ref 150–400)
RBC: 4.19 MIL/uL — AB (ref 4.22–5.81)
RDW: 14.6 % (ref 11.5–15.5)
WBC: 5.7 10*3/uL (ref 4.0–10.5)

## 2018-03-12 LAB — PROTIME-INR
INR: 0.94
PROTHROMBIN TIME: 12.5 s (ref 11.4–15.2)

## 2018-03-12 LAB — GLUCOSE, CAPILLARY
GLUCOSE-CAPILLARY: 88 mg/dL (ref 65–99)
Glucose-Capillary: 82 mg/dL (ref 65–99)
Glucose-Capillary: 86 mg/dL (ref 65–99)

## 2018-03-12 LAB — SAMPLE TO BLOOD BANK

## 2018-03-12 LAB — COMPREHENSIVE METABOLIC PANEL
ALBUMIN: 3.8 g/dL (ref 3.5–5.0)
ALT: 16 U/L — AB (ref 17–63)
AST: 21 U/L (ref 15–41)
Alkaline Phosphatase: 46 U/L (ref 38–126)
Anion gap: 9 (ref 5–15)
BUN: 29 mg/dL — AB (ref 6–20)
CHLORIDE: 105 mmol/L (ref 101–111)
CO2: 26 mmol/L (ref 22–32)
CREATININE: 1.57 mg/dL — AB (ref 0.61–1.24)
Calcium: 8.7 mg/dL — ABNORMAL LOW (ref 8.9–10.3)
GFR calc Af Amer: 50 mL/min — ABNORMAL LOW (ref >60)
GFR calc non Af Amer: 43 mL/min — ABNORMAL LOW (ref >60)
Glucose, Bld: 114 mg/dL — ABNORMAL HIGH (ref 65–99)
POTASSIUM: 3.8 mmol/L (ref 3.5–5.1)
SODIUM: 140 mmol/L (ref 135–145)
Total Bilirubin: 0.5 mg/dL (ref 0.3–1.2)
Total Protein: 7 g/dL (ref 6.5–8.1)

## 2018-03-12 LAB — HEMOGLOBIN AND HEMATOCRIT, BLOOD
HCT: 34.6 % — ABNORMAL LOW (ref 39.0–52.0)
HCT: 32.8 % — ABNORMAL LOW (ref 39.0–52.0)
Hemoglobin: 11 g/dL — ABNORMAL LOW (ref 13.0–17.0)
Hemoglobin: 10.4 g/dL — ABNORMAL LOW (ref 13.0–17.0)

## 2018-03-12 LAB — HEMOGLOBIN A1C
Hgb A1c MFr Bld: 5.9 % — ABNORMAL HIGH (ref 4.8–5.6)
Mean Plasma Glucose: 122.63 mg/dL

## 2018-03-12 LAB — APTT: aPTT: 30 seconds (ref 24–36)

## 2018-03-12 LAB — ETHANOL

## 2018-03-12 LAB — POC OCCULT BLOOD, ED: Fecal Occult Bld: POSITIVE — AB

## 2018-03-12 LAB — LIPASE, BLOOD: Lipase: 38 U/L (ref 11–51)

## 2018-03-12 SURGERY — EGD (ESOPHAGOGASTRODUODENOSCOPY)
Anesthesia: Moderate Sedation

## 2018-03-12 MED ORDER — AMLODIPINE BESYLATE 5 MG PO TABS: 2.5000 mg | ORAL_TABLET | Freq: Every day | ORAL | Status: DC

## 2018-03-12 MED ORDER — LEVOTHYROXINE SODIUM 100 MCG PO TABS
100.0000 ug | ORAL_TABLET | Freq: Every day | ORAL | Status: DC
  Administered 2018-03-13: 100 ug via ORAL
  Filled 2018-03-12 (×2): qty 1

## 2018-03-12 MED ORDER — ACETAMINOPHEN 325 MG PO TABS: 650.0000 mg | ORAL_TABLET | Freq: Four times a day (QID) | ORAL | Status: DC | PRN

## 2018-03-12 MED ORDER — SODIUM CHLORIDE 0.9 % IV BOLUS
500.0000 mL | Freq: Once | INTRAVENOUS | Status: AC
Start: 2018-03-12 — End: 2018-03-12
  Administered 2018-03-12: 500 mL via INTRAVENOUS

## 2018-03-12 MED ORDER — SODIUM CHLORIDE 0.9 % IV SOLN: INTRAVENOUS | Status: DC

## 2018-03-12 MED ORDER — ACETAMINOPHEN 650 MG RE SUPP: 650.0000 mg | Freq: Four times a day (QID) | RECTAL | Status: DC | PRN

## 2018-03-12 MED ORDER — ONDANSETRON HCL 4 MG PO TABS: 4.0000 mg | ORAL_TABLET | Freq: Four times a day (QID) | ORAL | Status: DC | PRN

## 2018-03-12 MED ORDER — MIDAZOLAM HCL 5 MG/5ML IJ SOLN
INTRAMUSCULAR | Status: DC | PRN
  Administered 2018-03-12 (×2): 2 mg via INTRAVENOUS

## 2018-03-12 MED ORDER — LORAZEPAM 1 MG PO TABS: 1.0000 mg | ORAL_TABLET | Freq: Four times a day (QID) | ORAL | Status: DC | PRN

## 2018-03-12 MED ORDER — IOPAMIDOL (ISOVUE-300) INJECTION 61%
100.0000 mL | Freq: Once | INTRAVENOUS | Status: AC | PRN
  Administered 2018-03-12: 80 mL via INTRAVENOUS

## 2018-03-12 MED ORDER — MEPERIDINE HCL 100 MG/ML IJ SOLN
INTRAMUSCULAR | Status: AC
  Filled 2018-03-12: qty 2

## 2018-03-12 MED ORDER — INSULIN ASPART 100 UNIT/ML ~~LOC~~ SOLN: 0.0000 [IU] | Freq: Three times a day (TID) | SUBCUTANEOUS | Status: DC

## 2018-03-12 MED ORDER — LORAZEPAM 2 MG/ML IJ SOLN: 1.0000 mg | Freq: Four times a day (QID) | INTRAMUSCULAR | Status: DC | PRN

## 2018-03-12 MED ORDER — LEVOTHYROXINE SODIUM 100 MCG PO TABS: 100.0000 ug | ORAL_TABLET | Freq: Every day | ORAL | Status: DC

## 2018-03-12 MED ORDER — GABAPENTIN 100 MG PO CAPS
200.0000 mg | ORAL_CAPSULE | Freq: Three times a day (TID) | ORAL | Status: DC
  Administered 2018-03-12 – 2018-03-14 (×6): 200 mg via ORAL
  Filled 2018-03-12 (×7): qty 2

## 2018-03-12 MED ORDER — HYDRALAZINE HCL 20 MG/ML IJ SOLN: 5.0000 mg | Freq: Four times a day (QID) | INTRAMUSCULAR | Status: DC | PRN

## 2018-03-12 MED ORDER — VITAMIN B-1 100 MG PO TABS
100.0000 mg | ORAL_TABLET | Freq: Every day | ORAL | Status: DC
  Administered 2018-03-12 – 2018-03-14 (×3): 100 mg via ORAL
  Filled 2018-03-12 (×3): qty 1

## 2018-03-12 MED ORDER — PANTOPRAZOLE SODIUM 40 MG IV SOLR
40.0000 mg | INTRAVENOUS | Status: AC
  Administered 2018-03-12: 40 mg via INTRAVENOUS
  Filled 2018-03-12: qty 40

## 2018-03-12 MED ORDER — MEPERIDINE HCL 100 MG/ML IJ SOLN
INTRAMUSCULAR | Status: DC | PRN
  Administered 2018-03-12: 50 mg via INTRAVENOUS
  Administered 2018-03-12: 25 mg via INTRAVENOUS

## 2018-03-12 MED ORDER — ESCITALOPRAM OXALATE 10 MG PO TABS
20.0000 mg | ORAL_TABLET | Freq: Two times a day (BID) | ORAL | Status: DC
  Administered 2018-03-12 – 2018-03-14 (×5): 20 mg via ORAL
  Filled 2018-03-12 (×5): qty 2

## 2018-03-12 MED ORDER — PANTOPRAZOLE SODIUM 40 MG PO TBEC
40.0000 mg | DELAYED_RELEASE_TABLET | Freq: Every day | ORAL | Status: DC
  Administered 2018-03-13 – 2018-03-14 (×2): 40 mg via ORAL
  Filled 2018-03-12 (×2): qty 1

## 2018-03-12 MED ORDER — SODIUM CHLORIDE 0.9 % IV SOLN
INTRAVENOUS | Status: DC
Start: 2018-03-12 — End: 2018-03-12

## 2018-03-12 MED ORDER — THIAMINE HCL 100 MG/ML IJ SOLN: 100.0000 mg | Freq: Every day | INTRAMUSCULAR | Status: DC

## 2018-03-12 MED ORDER — ONDANSETRON HCL 4 MG/2ML IJ SOLN
INTRAMUSCULAR | Status: DC | PRN
  Administered 2018-03-12: 4 mg via INTRAVENOUS

## 2018-03-12 MED ORDER — ONDANSETRON HCL 4 MG/2ML IJ SOLN
4.0000 mg | Freq: Four times a day (QID) | INTRAMUSCULAR | Status: DC | PRN
Start: 2018-03-12 — End: 2018-03-14

## 2018-03-12 MED ORDER — ALUM & MAG HYDROXIDE-SIMETH 200-200-20 MG/5ML PO SUSP: 30.0000 mL | Freq: Four times a day (QID) | ORAL | Status: DC | PRN

## 2018-03-12 MED ORDER — POTASSIUM CHLORIDE IN NACL 20-0.9 MEQ/L-% IV SOLN
INTRAVENOUS | Status: DC
  Administered 2018-03-12: 09:00:00 via INTRAVENOUS

## 2018-03-12 MED ORDER — ADULT MULTIVITAMIN W/MINERALS CH
1.0000 | ORAL_TABLET | Freq: Every day | ORAL | Status: DC
  Administered 2018-03-12 – 2018-03-14 (×3): 1 via ORAL
  Filled 2018-03-12 (×3): qty 1

## 2018-03-12 MED ORDER — ONDANSETRON HCL 4 MG/2ML IJ SOLN
INTRAMUSCULAR | Status: AC
  Filled 2018-03-12: qty 2

## 2018-03-12 MED ORDER — LIDOCAINE VISCOUS 2 % MT SOLN
OROMUCOSAL | Status: AC
  Filled 2018-03-12: qty 15

## 2018-03-12 MED ORDER — MIDAZOLAM HCL 5 MG/5ML IJ SOLN
INTRAMUSCULAR | Status: AC
  Filled 2018-03-12: qty 10

## 2018-03-12 MED ORDER — FOLIC ACID 1 MG PO TABS
1.0000 mg | ORAL_TABLET | Freq: Every day | ORAL | Status: DC
  Administered 2018-03-12 – 2018-03-14 (×3): 1 mg via ORAL
  Filled 2018-03-12 (×3): qty 1

## 2018-03-12 MED ORDER — AMLODIPINE BESYLATE 5 MG PO TABS
2.5000 mg | ORAL_TABLET | Freq: Every day | ORAL | Status: DC
  Administered 2018-03-13: 2.5 mg via ORAL
  Filled 2018-03-12 (×2): qty 1

## 2018-03-12 MED ORDER — LIDOCAINE VISCOUS 2 % MT SOLN
OROMUCOSAL | Status: DC | PRN
  Administered 2018-03-12: 1 via OROMUCOSAL

## 2018-03-12 NOTE — ED Provider Notes (Signed)
[facility] EMERGENCY DEPARTMENT Provider Note   CSN: [id number] Arrival date & time: 03/12/18  0353  Time seen 04:45 AM   History   Chief Complaint Chief Complaint  Patient presents with  . Rectal Bleeding    HPI Kevin [name] is a 72 y.o. male.  HPI patient states yesterday, April 12, about 3 PM he ate some hot rice.  About 2 hours later he started having rectal bleeding.  He states it is dark red in color and he has had 4 episodes.  He states he is having upper abdominal pain that he describes as burning and it is bilateral.  He has been taken Coricidin over-the-counter for cold symptoms recently.  He states he takes Mobic about every other day for arthritis pain.  He also takes colchicine as needed.  He states he rarely drinks.  He states he had endoscopy many years ago and he did have a colonoscopy done about 5 years ago by Dr. Benson Norway and he states it was normal.  PCP Lucianne Lei, MD   Past Medical History:  Diagnosis Date  . Alcohol abuse   . Arthritis   . Diabetes mellitus without complication ([place])   . Diabetes mellitus, type II ([place])   . GERD (gastroesophageal reflux disease)   . Gout   . Hypertension   . Hyperthyroidism     Patient Active Problem List   Diagnosis Date Noted  . Substance induced mood disorder ([place]) 01/03/2014  . Alcohol dependence ([place]) 01/25/2013  . Alcohol withdrawal ([place]) 01/25/2013  . Generalized anxiety disorder 01/25/2013    History reviewed. No pertinent surgical history.      Home Medications    Prior to Admission medications   Medication Sig Start Date End Date Taking? Authorizing Provider  alum & mag hydroxide-simeth (MAALOX/MYLANTA) 200-200-20 MG/5ML suspension Take 30 mLs by mouth every 6 (six) hours as needed for indigestion or heartburn.   Yes [provider]  Colchicine 0.6 MG CAPS Take 1 tablet by mouth daily. 01/18/18  Yes [provider]  escitalopram (LEXAPRO) 20 MG tablet Take 1 tablet (20 mg total)  by mouth 2 (two) times daily. 11/01/17 11/01/18 Yes Cloria Spring, MD  gabapentin (NEURONTIN) 100 MG capsule TAKE 2 CAPSULES BY MOUTH 3 TIMES DAILY. FOR SUBSTANCE WITHDRAWAL SYNDROME, ANXIETY/PAIN 01/25/18  Yes Cloria Spring, MD  levothyroxine (SYNTHROID, LEVOTHROID) 100 MCG tablet Take 1 tablet (100 mcg total) by mouth daily. For thyroid hormone replacement 01/17/14  Yes Lindell Spar I, NP  meloxicam (MOBIC) 15 MG tablet Take 15 mg by mouth daily as needed. 07/02/15  Yes [provider]  Olmesartan-Amlodipine-HCTZ (TRIBENZOR) 40-10-12.5 MG TABS Take 1 tablet by mouth daily. For hypertension 01/17/14  Yes Nwoko, Herbert Pun I, NP  metFORMIN (GLUCOPHAGE) 1000 MG tablet Take 1,000 mg by mouth at bedtime. 02/15/18   [provider]    Family History Family History  Problem Relation Age of Onset  . Alcohol abuse Paternal Aunt   . Drug abuse Paternal Uncle   . Alcohol abuse Paternal Uncle     Social History Social History   Tobacco Use  . Smoking status: Never Smoker  . Smokeless tobacco: Never Used  Substance Use Topics  . Alcohol use: No    Comment: one fifth daily/ last used 5 weeks ago  . Drug use: No    Types: Benzodiazepines, Hydrocodone, Oxycodone   Retired  Allergies   Trazodone and nefazodone   Review of Systems Review of Systems  All other  systems reviewed and are negative.    Physical Exam Updated Vital Signs BP 124/74 (BP Location: Right Arm)   Pulse 62   Temp 98.3 F (36.8 C) (Oral)   Resp 17   Ht 6\' 1"  (1.854 m)   Wt 99.8 kg (220 lb)   SpO2 97%   BMI 29.03 kg/m   Vital signs normal    Physical Exam  Constitutional: He is oriented to person, place, and time. He appears well-developed and well-nourished.  Non-toxic appearance. He does not appear ill. No distress.  HENT:  Head: Normocephalic and atraumatic.  Right Ear: External ear normal.  Left Ear: External ear normal.  Nose: Nose normal. No mucosal edema or rhinorrhea.  Mouth/Throat:  Oropharynx is clear and moist and mucous membranes are normal. No dental abscesses or uvula swelling.  Eyes: Pupils are equal, round, and reactive to light. Conjunctivae and EOM are normal.  Neck: Normal range of motion and full passive range of motion without pain. Neck supple.  Cardiovascular: Normal rate, regular rhythm and normal heart sounds. Exam reveals no gallop and no friction rub.  No murmur heard. Pulmonary/Chest: Effort normal and breath sounds normal. No respiratory distress. He has no wheezes. He has no rhonchi. He has no rales. He exhibits no tenderness and no crepitus.  Abdominal: Soft. Normal appearance and bowel sounds are normal. He exhibits no distension. There is tenderness in the right upper quadrant, epigastric area and left upper quadrant. There is no rebound and no guarding.    Musculoskeletal: Normal range of motion. He exhibits no edema or tenderness.  Moves all extremities well.   Neurological: He is alert and oriented to person, place, and time. He has normal strength. No cranial nerve deficit.  Skin: Skin is warm, dry and intact. No rash noted. No erythema. No pallor.  Psychiatric: He has a normal mood and affect. His speech is normal and behavior is normal. His mood appears not anxious.  Nursing note and vitals reviewed.    ED Treatments / Results  Labs (all labs ordered are listed, but only abnormal results are displayed) Results for orders placed or performed during the hospital encounter of 03/12/18  Comprehensive metabolic panel  Result Value Ref Range   Sodium 140 135 - 145 mmol/L   Potassium 3.8 3.5 - 5.1 mmol/L   Chloride 105 101 - 111 mmol/L   CO2 26 22 - 32 mmol/L   Glucose, Bld 114 (H) 65 - 99 mg/dL   BUN 29 (H) 6 - 20 mg/dL   Creatinine, Ser 1.57 (H) 0.61 - 1.24 mg/dL   Calcium 8.7 (L) 8.9 - 10.3 mg/dL   Total Protein 7.0 6.5 - 8.1 g/dL   Albumin 3.8 3.5 - 5.0 g/dL   AST 21 15 - 41 U/L   ALT 16 (L) 17 - 63 U/L   Alkaline Phosphatase 46 38  - 126 U/L   Total Bilirubin 0.5 0.3 - 1.2 mg/dL   GFR calc non Af Amer 43 (L) >60 mL/min   GFR calc Af Amer 50 (L) >60 mL/min   Anion gap 9 5 - 15  CBC with Differential  Result Value Ref Range   WBC 5.7 4.0 - 10.5 K/uL   RBC 4.19 (L) 4.22 - 5.81 MIL/uL   Hemoglobin 12.1 (L) 13.0 - 17.0 g/dL   HCT 38.4 (L) 39.0 - 52.0 %   MCV 91.6 78.0 - 100.0 fL   MCH 28.9 26.0 - 34.0 pg   MCHC 31.5 30.0 - 36.0  g/dL   RDW 14.6 11.5 - 15.5 %   Platelets 185 150 - 400 K/uL   Neutrophils Relative % 64 %   Neutro Abs 3.7 1.7 - 7.7 K/uL   Lymphocytes Relative 27 %   Lymphs Abs 1.5 0.7 - 4.0 K/uL   Monocytes Relative 5 %   Monocytes Absolute 0.3 0.1 - 1.0 K/uL   Eosinophils Relative 4 %   Eosinophils Absolute 0.2 0.0 - 0.7 K/uL   Basophils Relative 0 %   Basophils Absolute 0.0 0.0 - 0.1 K/uL  Protime-INR  Result Value Ref Range   Prothrombin Time 12.5 11.4 - 15.2 seconds   INR 0.94   APTT  Result Value Ref Range   aPTT 30 24 - 36 seconds  Ethanol  Result Value Ref Range   Alcohol, Ethyl (B) <10 <10 mg/dL  Lipase, blood  Result Value Ref Range   Lipase 38 11 - 51 U/L  POC occult blood, ED RN will collect  Result Value Ref Range   Fecal Occult Bld POSITIVE (A) NEGATIVE   Laboratory interpretation all normal except mild anemia, + hemoccult, renal insufficiency    EKG None  Radiology Ct Abdomen Pelvis W Contrast  Result Date: 03/12/2018 CLINICAL DATA:  Epigastric abdominal pain. Rectal bleeding. Abdominal distension. EXAM: CT ABDOMEN AND PELVIS WITH CONTRAST TECHNIQUE: Multidetector CT imaging of the abdomen and pelvis was performed using the standard protocol following bolus administration of intravenous contrast. CONTRAST:  65mL ISOVUE-300 IOPAMIDOL (ISOVUE-300) INJECTION 61% COMPARISON:  None. FINDINGS: Lower chest: Patchy opacity in the right lower lobe is only partially included in the field of view, image 1 series 4. Hepatobiliary: Punctate hepatic granuloma. No focal hepatic lesion.  Calcified gallstones within partially distended gallbladder. No pericholecystic inflammation. No biliary dilatation. Pancreas: No ductal dilatation or inflammation. Spleen: Normal in size without focal abnormality. Adrenals/Urinary Tract: Bilateral adrenal thickening without dominant nodule. No hydronephrosis. Mild symmetric perinephric edema is likely chronic. Bilateral renal cysts including parapelvic cysts in the left kidney. A 17 mm cyst in the upper left kidney is mildly complex and contains a central calcification. Urinary bladder is physiologically distended without wall thickening. Stomach/Bowel: Stomach is nondistended. No small bowel inflammation, obstruction or wall thickening. Normal appendix. Multifocal colonic diverticulosis throughout the entire colon without acute diverticulitis. No colonic wall thickening or inflammatory change. Vascular/Lymphatic: Aorta bi-iliac atherosclerosis. No aneurysm. No enlarged abdominal or pelvic lymph nodes. Reproductive: Enlarged prostate gland spanning 5.6 cm transverse. Other: No free air or ascites. No intra-abdominal abscess. There is fat in the inguinal canals. Musculoskeletal: There are no acute or suspicious osseous abnormalities. Degenerative change in the lower lumbar spine with degenerative disc disease and facet arthropathy. IMPRESSION: 1. Multifocal colonic diverticulosis without acute diverticulitis. No bowel inflammation or other explanation for GI bleed. No obstruction. 2. Gallstones without gallbladder inflammation. 3. Minimal patchy opacity in the right lower lobe is only partially included, may be infectious or inflammatory. 4. Incidental findings of enlarged prostate gland and Aortic Atherosclerosis (ICD10-I70.0). Electronically Signed   By: Jeb Levering M.D.   On: 03/12/2018 06:48    Procedures Procedures (including critical care time)  Medications Ordered in ED Medications  sodium chloride 0.9 % bolus 500 mL (0 mLs Intravenous Stopped  03/12/18 0641)  iopamidol (ISOVUE-300) 61 % injection 100 mL (80 mLs Intravenous Contrast Given 03/12/18 0617)     Initial Impression / Assessment and Plan / ED Course  I have reviewed the triage vital signs and the nursing notes.  Pertinent labs &  imaging results that were available during my care of the patient were reviewed by me and considered in my medical decision making (see chart for details).     Laboratory testing was done and patient was given IV fluids.  I suspect patient may have a gastric ulcer from his Mobic, with his upper abdominal pain described as burning, but he describes the blood as dark red, no melena.  He also has a history of alcoholism and appears to still drink occasionally according to the patient.  He states he has had endoscopy many years ago and a colonoscopy that were both normal.  CT scan of the abdomen was done to look for any obvious abnormalities that would predispose him to having bleeding.  The fact that he states the blood was dark red makes it sounds like it is more from the lower GI tract rather than the upper.  06:50 AM discussed his test results. States he went to the bathroom here and had another bloody BM, but not as bad as at home.  We discussed his CT results and he does have diverticulosis.  That is the most likely etiology for his rectal bleeding.  I do not think he is having a upper GI bleed because that stool would be more like melena or black instead of red.  Patient has a mild anemia but not enough to require transfusion.  We discussed admission and he is agreeable.  07:22 AM Dr Tat, hospitalist, will admit patient.   Final Clinical Impressions(s) / ED Diagnoses   Final diagnoses:  Rectal bleeding  Renal insufficiency  Diverticulosis    Plan admission  Rolland Porter, MD, Barbette Or, MD 03/12/18 [id number]

## 2018-03-12 NOTE — H&P (Signed)
History and Physical  Cormac Wint WFU:[id number] DOB: [birth year] DOA: 03/12/2018   PCP: Lucianne Lei, MD   Patient coming from: Home  Chief Complaint: rectal bleeding  HPI:  Kevin [name] is a 72 y.o. male with medical history of alcohol abuse, depression, hypothyroidism, hypertension, diabetes mellitus presenting with rectal bleeding that began around 6 PM on 03/11/2018.  The patient stated he ate some hot rice in the late afternoon on 03/11/2018.  Approximately 2 hours later, the patient had a bowel movement with maroon stool.  He subsequently had 2 additional bowel movements with maroon stool.  As result, he presented to emergency department for further evaluation.  He complains of some associated crampy abdominal pain.  He denies any fevers, chills, headache, neck pain, chest pain, shortness breath, nausea, vomiting, diarrhea, dysuria, hematuria.  He had some dizziness.  The patient takes meloxicam approximately 3-4 tablets/week for arthritis type symptoms.  He denies any other NSAIDs.  He states that he drinks about 3-4 beers per week but denies any other alcohol or illegal drug use.  In the emergency department, the patient had another bowel movement which he stated did not have any blood in the commode, but had some blood on the tissue.  The patient was afebrile hemodynamically stable saturating 100% on room air.  BMP, LFTs, and CBC were essentially unremarkable except for serum creatinine of 1.57 which is at his baseline.  CT of the abdomen and pelvis showed a punctate hepatic granuloma.  There was calcified gallstones with a partially distended gallbladder without pericholecystic fluid.  There was diverticulosis without any inflammatory changes.  Assessment/Plan: Hematochezia -Suspect diverticular GI bleed -Patient states that he had a colonoscopy approximately 6 years ago performed by Dr. Benson Norway which pt states was "normal" -GI consult -Trend hemoglobin -IV fluids -Clear liquid  diet -Coags within normal limits  Essential hypertension -Holding HCTZ and olmesartan -Start lower dose amlodipine -Monitor BP  Diabetes mellitus type 2 -Hemoglobin A1c -NovoLog sliding scale -Holding metformin  CKD stage III -Baseline creatinine 1.2-1.4 -A.m. BMP  Alcohol abuse -Alcohol withdrawal protocol  Hypothyroidism -Continue Synthroid  Depression -Continue Lexapro       Past Medical History:  Diagnosis Date  . Alcohol abuse   . Arthritis   . Diabetes mellitus without complication ([place])   . Diabetes mellitus, type II ([place])   . GERD (gastroesophageal reflux disease)   . Gout   . Hypertension   . Hyperthyroidism    History reviewed. No pertinent surgical history. Social History:  reports that he has never smoked. He has never used smokeless tobacco. He reports that he does not drink alcohol or use drugs.   Family History  Problem Relation Age of Onset  . Alcohol abuse Paternal Aunt   . Drug abuse Paternal Uncle   . Alcohol abuse Paternal Uncle      Allergies  Allergen Reactions  . Trazodone And Nefazodone     Unable to walk     Prior to Admission medications   Medication Sig Start Date End Date Taking? Authorizing Provider  alum & mag hydroxide-simeth (MAALOX/MYLANTA) 200-200-20 MG/5ML suspension Take 30 mLs by mouth every 6 (six) hours as needed for indigestion or heartburn.   Yes [provider]  Colchicine 0.6 MG CAPS Take 1 tablet by mouth daily. 01/18/18  Yes [provider]  escitalopram (LEXAPRO) 20 MG tablet Take 1 tablet (20 mg total) by mouth 2 (two) times daily. 11/01/17 11/01/18 Yes Cloria Spring, MD  gabapentin (NEURONTIN) 100 MG capsule TAKE 2 CAPSULES BY MOUTH 3 TIMES DAILY. FOR SUBSTANCE WITHDRAWAL SYNDROME, ANXIETY/PAIN 01/25/18  Yes Cloria Spring, MD  levothyroxine (SYNTHROID, LEVOTHROID) 100 MCG tablet Take 1 tablet (100 mcg total) by mouth daily. For thyroid hormone replacement 01/17/14  Yes Lindell Spar I,  NP  meloxicam (MOBIC) 15 MG tablet Take 15 mg by mouth daily as needed. 07/02/15  Yes [provider]  Olmesartan-Amlodipine-HCTZ (TRIBENZOR) 40-10-12.5 MG TABS Take 1 tablet by mouth daily. For hypertension 01/17/14  Yes Nwoko, Herbert Pun I, NP  metFORMIN (GLUCOPHAGE) 1000 MG tablet Take 1,000 mg by mouth at bedtime. 02/15/18   [provider]    Review of Systems:  Constitutional:  No weight loss, night sweats, Fevers, chills, fatigue.  Head&Eyes: No headache.  No vision loss.  No eye pain or scotoma ENT:  No Difficulty swallowing,Tooth/dental problems,Sore throat,  No ear ache, post nasal drip,  Cardio-vascular:  No chest pain, Orthopnea, PND, swelling in lower extremities,  dizziness, palpitations  GI:  No  abdominal pain, nausea, vomiting, diarrhea, loss of appetite, melena, heartburn, indigestion, Resp:  No shortness of breath with exertion or at rest. No cough. No coughing up of blood .No wheezing.No chest wall deformity  Skin:  no rash or lesions.  GU:  no dysuria, change in color of urine, no urgency or frequency. No flank pain.  Musculoskeletal:  No joint pain or swelling. No decreased range of motion. No back pain.  Psych:  No change in mood or affect. No depression or anxiety. Neurologic: No headache, no dysesthesia, no focal weakness, no vision loss. No syncope  Physical Exam: Vitals:   03/12/18 0438 03/12/18 0450 03/12/18 0636 03/12/18 0700  BP: 124/74  122/65 126/73  Pulse: 62  (!) 55 (!) 58  Resp: 17  16 16   Temp: 98.3 F (36.8 C)  98.1 F (36.7 C)   TempSrc: Oral  Oral   SpO2: 97% 97% 97% 98%  Weight:      Height:       General:  A&O x 3, NAD, nontoxic, pleasant/cooperative Head/Eye: No conjunctival hemorrhage, no icterus, [place]/AT, No nystagmus ENT:  No icterus,  No thrush, good dentition, no pharyngeal exudate Neck:  No masses, no lymphadenpathy, no bruits CV:  RRR, no rub, no gallop, no S3 Lung:  CTAB, good air movement, no wheeze, no  rhonchi Abdomen: soft/NT, +BS, nondistended, no peritoneal signs Ext: No cyanosis, No rashes, No petechiae, No lymphangitis, trace LE edema Neuro: CNII-XII intact, strength 4/5 in bilateral upper and lower extremities, no dysmetria  Labs on Admission:  Basic Metabolic Panel: Recent Labs  Lab 03/12/18 0548  NA 140  K 3.8  CL 105  CO2 26  GLUCOSE 114*  BUN 29*  CREATININE 1.57*  CALCIUM 8.7*   Liver Function Tests: Recent Labs  Lab 03/12/18 0548  AST 21  ALT 16*  ALKPHOS 46  BILITOT 0.5  PROT 7.0  ALBUMIN 3.8   Recent Labs  Lab 03/12/18 0548  LIPASE 38   No results for input(s): AMMONIA in the last 168 hours. CBC: Recent Labs  Lab 03/12/18 0548  WBC 5.7  NEUTROABS 3.7  HGB 12.1*  HCT 38.4*  MCV 91.6  PLT 185   Coagulation Profile: Recent Labs  Lab 03/12/18 0548  INR 0.94   Cardiac Enzymes: No results for input(s): CKTOTAL, CKMB, CKMBINDEX, TROPONINI in the last 168 hours. BNP: Invalid input(s): POCBNP CBG: No results for input(s): GLUCAP in the last 168 hours. Urine  analysis:    Component Value Date/Time   COLORURINE YELLOW 01/02/2014 1803   APPEARANCEUR HAZY (A) 01/02/2014 1803   LABSPEC 1.023 01/02/2014 1803   PHURINE 5.0 01/02/2014 1803   GLUCOSEU NEGATIVE 01/02/2014 1803   HGBUR NEGATIVE 01/02/2014 1803   BILIRUBINUR NEGATIVE 01/02/2014 1803   KETONESUR 15 (A) 01/02/2014 1803   PROTEINUR NEGATIVE 01/02/2014 1803   UROBILINOGEN 1.0 01/02/2014 1803   NITRITE NEGATIVE 01/02/2014 1803   LEUKOCYTESUR NEGATIVE 01/02/2014 1803   Sepsis Labs: @LABRCNTIP (procalcitonin:4,lacticidven:4) )No results found for this or any previous visit (from the past 240 hour(s)).   Radiological Exams on Admission: Ct Abdomen Pelvis W Contrast  Result Date: 03/12/2018 CLINICAL DATA:  Epigastric abdominal pain. Rectal bleeding. Abdominal distension. EXAM: CT ABDOMEN AND PELVIS WITH CONTRAST TECHNIQUE: Multidetector CT imaging of the abdomen and pelvis was  performed using the standard protocol following bolus administration of intravenous contrast. CONTRAST:  60mL ISOVUE-300 IOPAMIDOL (ISOVUE-300) INJECTION 61% COMPARISON:  None. FINDINGS: Lower chest: Patchy opacity in the right lower lobe is only partially included in the field of view, image 1 series 4. Hepatobiliary: Punctate hepatic granuloma. No focal hepatic lesion. Calcified gallstones within partially distended gallbladder. No pericholecystic inflammation. No biliary dilatation. Pancreas: No ductal dilatation or inflammation. Spleen: Normal in size without focal abnormality. Adrenals/Urinary Tract: Bilateral adrenal thickening without dominant nodule. No hydronephrosis. Mild symmetric perinephric edema is likely chronic. Bilateral renal cysts including parapelvic cysts in the left kidney. A 17 mm cyst in the upper left kidney is mildly complex and contains a central calcification. Urinary bladder is physiologically distended without wall thickening. Stomach/Bowel: Stomach is nondistended. No small bowel inflammation, obstruction or wall thickening. Normal appendix. Multifocal colonic diverticulosis throughout the entire colon without acute diverticulitis. No colonic wall thickening or inflammatory change. Vascular/Lymphatic: Aorta bi-iliac atherosclerosis. No aneurysm. No enlarged abdominal or pelvic lymph nodes. Reproductive: Enlarged prostate gland spanning 5.6 cm transverse. Other: No free air or ascites. No intra-abdominal abscess. There is fat in the inguinal canals. Musculoskeletal: There are no acute or suspicious osseous abnormalities. Degenerative change in the lower lumbar spine with degenerative disc disease and facet arthropathy. IMPRESSION: 1. Multifocal colonic diverticulosis without acute diverticulitis. No bowel inflammation or other explanation for GI bleed. No obstruction. 2. Gallstones without gallbladder inflammation. 3. Minimal patchy opacity in the right lower lobe is only partially  included, may be infectious or inflammatory. 4. Incidental findings of enlarged prostate gland and Aortic Atherosclerosis (ICD10-I70.0). Electronically Signed   By: Jeb Levering M.D.   On: 03/12/2018 06:48        Time spent:60 minutes Code Status:   FULL Family Communication:  No Family at bedside Disposition Plan: expect 2-3 day hospitalization Consults called: GI DVT Prophylaxis:  SCDs  Orson Eva, DO  [facility] Pager [phone]  If 7PM-7AM, please contact night-coverage [email or web address] Password [facility] 03/12/2018, 7:52 AM

## 2018-03-12 NOTE — Progress Notes (Addendum)
Patient had already taken Norvasc, Synthroid and Metformin from home medications, educated him on not taking meds from home as we provide them while he is here. Patient understood.

## 2018-03-12 NOTE — Op Note (Signed)
[facility] Patient Name: Kevin [name] Procedure Date: 03/12/2018 11:49 AM MRN: [id number] Date of Birth: Jun 16, 1946 Attending MD: Norvel Richards , MD CSN: [id number] Age: 72 Admit Type: Inpatient Procedure:                Upper GI endoscopy Indications:              Hematochezia Providers:                Norvel Richards, MD, Lurline Del, RN, Nelma Rothman, Technician, Randa Spike, Technician Referring MD:              Medicines:                Midazolam 4 mg IV, Meperidine 75 mg IV Complications:            No immediate complications. Estimated Blood Loss:     Estimated blood loss was minimal. Estimated blood                            loss was minimal. Procedure:                Pre-Anesthesia Assessment:                           - Prior to the procedure, a History and Physical                            was performed, and patient medications and                            allergies were reviewed. The patient's tolerance of                            previous anesthesia was also reviewed. The risks                            and benefits of the procedure and the sedation                            options and risks were discussed with the patient.                            All questions were answered, and informed consent                            was obtained. ASA Grade Assessment: II - A patient                            with mild systemic disease. After reviewing the                            risks and benefits, the patient was deemed in  satisfactory condition to undergo the procedure.                           After obtaining informed consent, the endoscope was                            passed under direct vision. Throughout the                            procedure, the patient's blood pressure, pulse, and                            oxygen saturations were monitored continuously. The          [id number]-[id number] ([id number]) scope was introduced through the                            mouth, and advanced to the second part of duodenum.                            The upper GI endoscopy was accomplished without                            difficulty. The patient tolerated the procedure                            well. Scope In: 12:18:38 PM Scope Out: 12:28:28 PM Total Procedure Duration: 0 hours 9 minutes 50 seconds  Findings:      Esophagitis was found. distal esophageal erosions straddling the GE       junction.      Salmon-colored mucosa was present. Single tongue coming up 3 cm above       the GE junction. No nodularity. This was biopsied with a cold forceps       for histology. Estimated blood loss was minimal.      A non-obstructing Schatzki ring was found at the gastroesophageal       junction.      A small hiatal hernia was present.      diffuse friability and petechial hemorrhage seen in the entire examined       stomach. This was biopsied with a cold forceps for histology. Estimated       blood loss was minimal.      Scattered gastric erosions. Pyloric channel deformity. No ulcer or       infiltrating process..      The duodenal bulb and second portion of the duodenum were normal. Impression:               - Erosive Reflux Esophagitis.                           - Salmon-colored mucosa. Biopsied.                           - Non-obstructing Schatzki ring.                           - Small hiatal hernia.                           -  Gastric petechia(e). Biopsied.                           - Erosive gastropathy.                           - Normal duodenal bulb and second portion of the                            duodenum. I doubt a significant upper GI bleed.                            Recent maroon stools likely emanating from the                            colon. Moderate Sedation:      Moderate (conscious) sedation was administered by the endoscopy nurse       and  supervised by the endoscopist. The following parameters were       monitored: oxygen saturation, heart rate, blood pressure, respiratory       rate, EKG, adequacy of pulmonary ventilation, and response to care.       Total physician intraservice time was 17 minutes. Recommendation:           - Patient has a contact number available for                            emergencies. The signs and symptoms of potential                            delayed complications were discussed with the                            patient. Return to normal activities tomorrow.                            Written discharge instructions were provided to the                            patient.                           - Return patient to hospital ward for ongoing care.                           - Return to my office in 6 weeks to set up an                            elective colonoscopy.                           - Diabetic (ADA) diet.                           - Continue present medications. Begin daily PPI                           -  No repeat upper endoscopy. Procedure Code(s):        --- Professional ---                           [id number], Esophagogastroduodenoscopy, flexible,                            transoral; with biopsy, single or multiple                           G0500, Moderate sedation services provided by the                            same physician or other qualified health care                            professional performing a gastrointestinal                            endoscopic service that sedation supports,                            requiring the presence of an independent trained                            observer to assist in the monitoring of the                            patient's level of consciousness and physiological                            status; initial 15 minutes of intra-service time;                            patient age 48 years or older (additional time may                             be reported with [phone], as appropriate) Diagnosis Code(s):        --- Professional ---                           K20.9, Esophagitis, unspecified                           K22.8, Other specified diseases of esophagus                           K22.2, Esophageal obstruction                           K44.9, Diaphragmatic hernia without obstruction or                            gangrene  K31.89, Other diseases of stomach and duodenum                           K92.1, Melena (includes Hematochezia) CPT copyright 2017 [employer] Association. All rights reserved. The codes documented in this report are preliminary and upon coder review may  be revised to meet current compliance requirements. Cristopher Estimable. Jonnie Kubly, MD Norvel Richards, MD 03/12/2018 12:49:18 PM This report has been signed electronically. Number of Addenda: 0

## 2018-03-12 NOTE — Consult Note (Addendum)
Referring Provider: No ref. provider found Primary Care Physician:  Lucianne Lei, MD Primary Gastroenterologist:  Dr. Benson Norway  Reason for Consultation:  GI bleeding   HPI:   71 year old gentleman presented with multiple episodes of dark maroon stools yesterday. Patient states he has had crampy upper abdominal pain for a couple of weeks. He notes that he has been escalating his dosing of meloxicam daily for various arthritic symptoms. He reports having normal bowel movements until yesterday. He has not had any melena or hematemesis. He does have frequent reflux symptoms but no dysphagia. He is not on a proton pump inhibitor. Distant history of alcohol abuse.  Distant history of peptic ulcer disease. Colonoscopy 6 years ago by Dr. Benson Norway reportedly normal.  CT scanning this admission demonstrated a granuloma in the liver. No evidence of colitis or neoplasm. Does have diverticulosis and gallstones.   Initial H&H 13.6 and 39.6. This a.m. 12.1 and 38.4.  BUN of 29 creatinine of 1.57   Past Medical History:  Diagnosis Date  . Alcohol abuse   . Arthritis   . Diabetes mellitus without complication ([place])   . Diabetes mellitus, type II ([place])   . GERD (gastroesophageal reflux disease)   . Gout   . Hypertension   . Hyperthyroidism     History reviewed. No pertinent surgical history.  Prior to Admission medications   Medication Sig Start Date End Date Taking? Authorizing Provider  alum & mag hydroxide-simeth (MAALOX/MYLANTA) 200-200-20 MG/5ML suspension Take 30 mLs by mouth every 6 (six) hours as needed for indigestion or heartburn.   Yes [provider]  Colchicine 0.6 MG CAPS Take 1 tablet by mouth daily. 01/18/18  Yes [provider]  escitalopram (LEXAPRO) 20 MG tablet Take 1 tablet (20 mg total) by mouth 2 (two) times daily. 11/01/17 11/01/18 Yes Cloria Spring, MD  gabapentin (NEURONTIN) 100 MG capsule TAKE 2 CAPSULES BY MOUTH 3 TIMES DAILY. FOR SUBSTANCE WITHDRAWAL  SYNDROME, ANXIETY/PAIN 01/25/18  Yes Cloria Spring, MD  levothyroxine (SYNTHROID, LEVOTHROID) 100 MCG tablet Take 1 tablet (100 mcg total) by mouth daily. For thyroid hormone replacement 01/17/14  Yes Lindell Spar I, NP  meloxicam (MOBIC) 15 MG tablet Take 15 mg by mouth daily as needed. 07/02/15  Yes [provider]  Olmesartan-Amlodipine-HCTZ (TRIBENZOR) 40-10-12.5 MG TABS Take 1 tablet by mouth daily. For hypertension 01/17/14  Yes Nwoko, Herbert Pun I, NP  metFORMIN (GLUCOPHAGE) 1000 MG tablet Take 1,000 mg by mouth at bedtime. 02/15/18   [provider]    Current Facility-Administered Medications  Medication Dose Route Frequency Provider Last Rate Last Dose  . 0.9 % NaCl with KCl 20 mEq/ L  infusion   Intravenous Continuous Tat, Shanon Brow, MD 50 mL/hr at 03/12/18 0916    . acetaminophen (TYLENOL) tablet 650 mg  650 mg Oral Q6H PRN Tat, David, MD       Or  . acetaminophen (TYLENOL) suppository 650 mg  650 mg Rectal Q6H PRN Tat, Shanon Brow, MD      . alum & mag hydroxide-simeth (MAALOX/MYLANTA) 200-200-20 MG/5ML suspension 30 mL  30 mL Oral Q6H PRN Tat, David, MD      . amLODipine (NORVASC) tablet 2.5 mg  2.5 mg Oral Daily Tat, David, MD      . escitalopram (LEXAPRO) tablet 20 mg  20 mg Oral BID Tat, David, MD      . folic acid (FOLVITE) tablet 1 mg  1 mg Oral Daily Tat, Shanon Brow, MD      .  gabapentin (NEURONTIN) capsule 200 mg  200 mg Oral TID Tat, David, MD      . hydrALAZINE (APRESOLINE) injection 5 mg  5 mg Intravenous Q6H PRN Tat, David, MD      . insulin aspart (novoLOG) injection 0-9 Units  0-9 Units Subcutaneous TID WC Tat, David, MD      . levothyroxine (SYNTHROID, LEVOTHROID) tablet 100 mcg  100 mcg Oral Q0600 Tat, David, MD      . LORazepam (ATIVAN) tablet 1 mg  1 mg Oral Q6H PRN Orson Eva, MD       Or  . LORazepam (ATIVAN) injection 1 mg  1 mg Intravenous Q6H PRN Tat, David, MD      . multivitamin with minerals tablet 1 tablet  1 tablet Oral Daily Tat, David, MD      .  ondansetron (ZOFRAN) tablet 4 mg  4 mg Oral Q6H PRN Tat, Shanon Brow, MD       Or  . ondansetron (ZOFRAN) injection 4 mg  4 mg Intravenous Q6H PRN Tat, David, MD      . thiamine (VITAMIN B-1) tablet 100 mg  100 mg Oral Daily Tat, David, MD       Or  . thiamine (B-1) injection 100 mg  100 mg Intravenous Daily Tat, Shanon Brow, MD        Allergies as of 03/12/2018 - Review Complete 03/12/2018  Allergen Reaction Noted  . Trazodone and nefazodone  01/31/2014    Family History  Problem Relation Age of Onset  . Alcohol abuse Paternal Aunt   . Drug abuse Paternal Uncle   . Alcohol abuse Paternal Uncle     Social History   Socioeconomic History  . Marital status: Single    Spouse name: Not on file  . Number of children: Not on file  . Years of education: Not on file  . Highest education level: Not on file  Occupational History  . Not on file  Social Needs  . Financial resource strain: Not on file  . Food insecurity:    Worry: Not on file    Inability: Not on file  . Transportation needs:    Medical: Not on file    Non-medical: Not on file  Tobacco Use  . Smoking status: Never Smoker  . Smokeless tobacco: Never Used  Substance and Sexual Activity  . Alcohol use: No    Comment: one fifth daily/ last used 5 weeks ago  . Drug use: No    Types: Benzodiazepines, Hydrocodone, Oxycodone  . Sexual activity: Yes  Lifestyle  . Physical activity:    Days per week: Not on file    Minutes per session: Not on file  . Stress: Not on file  Relationships  . Social connections:    Talks on phone: Not on file    Gets together: Not on file    Attends religious service: Not on file    Active member of club or organization: Not on file    Attends meetings of clubs or organizations: Not on file    Relationship status: Not on file  . Intimate partner violence:    Fear of current or ex partner: Not on file    Emotionally abused: Not on file    Physically abused: Not on file    Forced sexual  activity: Not on file  Other Topics Concern  . Not on file  Social History Narrative  . Not on file    Review of Systems: As in history  of present illness.   Physical Exam: Vital signs in last 24 hours: Temp:  [98.1 F (36.7 C)-98.3 F (36.8 C)] 98.1 F (36.7 C) (04/13 0636) Pulse Rate:  [55-62] 58 (04/13 0700) Resp:  [16-17] 16 (04/13 0700) BP: (122-126)/(65-74) 126/73 (04/13 0700) SpO2:  [97 %-98 %] 98 % (04/13 0700) Weight:  [220 lb (99.8 kg)] 220 lb (99.8 kg) (04/13 0437)   General:   Alert,   well-nourished, pleasant and cooperative in NAD Head:  Normocephalic and atraumatic. Lungs:  Clear throughout to auscultation.   No wheezes, crackles, or rhonchi. No acute distress. Heart:  Regular rate and rhythm; no murmurs, clicks, rubs,  or gallops. Abdomen: nondistended. Positive bowel sounds. His does have some epigastric tenderness to palpation. No obvious mass or organomegaly  Intake/Output from previous day: No intake/output data recorded. Intake/Output this shift: No intake/output data recorded.  Lab Results: Recent Labs    03/12/18 0548  WBC 5.7  HGB 12.1*  HCT 38.4*  PLT 185   BMET Recent Labs    03/12/18 0548  NA 140  K 3.8  CL 105  CO2 26  GLUCOSE 114*  BUN 29*  CREATININE 1.57*  CALCIUM 8.7*   LFT Recent Labs    03/12/18 0548  PROT 7.0  ALBUMIN 3.8  AST 21  ALT 16*  ALKPHOS 46  BILITOT 0.5   PT/INR Recent Labs    03/12/18 0548  LABPROT 12.5  INR 0.94   Hepatitis Panel No results for input(s): HEPBSAG, HCVAB, HEPAIGM, HEPBIGM in the last 72 hours. C-Diff No results for input(s): CDIFFTOX in the last 72 hours.  Studies/Results: Ct Abdomen Pelvis W Contrast  Result Date: 03/12/2018 CLINICAL DATA:  Epigastric abdominal pain. Rectal bleeding. Abdominal distension. EXAM: CT ABDOMEN AND PELVIS WITH CONTRAST TECHNIQUE: Multidetector CT imaging of the abdomen and pelvis was performed using the standard protocol following bolus  administration of intravenous contrast. CONTRAST:  66mL ISOVUE-300 IOPAMIDOL (ISOVUE-300) INJECTION 61% COMPARISON:  None. FINDINGS: Lower chest: Patchy opacity in the right lower lobe is only partially included in the field of view, image 1 series 4. Hepatobiliary: Punctate hepatic granuloma. No focal hepatic lesion. Calcified gallstones within partially distended gallbladder. No pericholecystic inflammation. No biliary dilatation. Pancreas: No ductal dilatation or inflammation. Spleen: Normal in size without focal abnormality. Adrenals/Urinary Tract: Bilateral adrenal thickening without dominant nodule. No hydronephrosis. Mild symmetric perinephric edema is likely chronic. Bilateral renal cysts including parapelvic cysts in the left kidney. A 17 mm cyst in the upper left kidney is mildly complex and contains a central calcification. Urinary bladder is physiologically distended without wall thickening. Stomach/Bowel: Stomach is nondistended. No small bowel inflammation, obstruction or wall thickening. Normal appendix. Multifocal colonic diverticulosis throughout the entire colon without acute diverticulitis. No colonic wall thickening or inflammatory change. Vascular/Lymphatic: Aorta bi-iliac atherosclerosis. No aneurysm. No enlarged abdominal or pelvic lymph nodes. Reproductive: Enlarged prostate gland spanning 5.6 cm transverse. Other: No free air or ascites. No intra-abdominal abscess. There is fat in the inguinal canals. Musculoskeletal: There are no acute or suspicious osseous abnormalities. Degenerative change in the lower lumbar spine with degenerative disc disease and facet arthropathy. IMPRESSION: 1. Multifocal colonic diverticulosis without acute diverticulitis. No bowel inflammation or other explanation for GI bleed. No obstruction. 2. Gallstones without gallbladder inflammation. 3. Minimal patchy opacity in the right lower lobe is only partially included, may be infectious or inflammatory. 4. Incidental  findings of enlarged prostate gland and Aortic Atherosclerosis (ICD10-I70.0). Electronically Signed   By: Fonnie Birkenhead.D.  On: 03/12/2018 06:48    Impression:  Pleasant 72 year old gentleman admitted to the hospital with GI bleeding. Dark maroon stools. Recently worsening dyspeptic symptoms in the setting of escalating doses of meloxicam without concomitant acid suppression therapy. Bump in BUN noted. He has known diverticulosis.. No evidence of colitis on current CT. History of peptic ulcer disease.  While his presentation could easily be due to a diverticular bleed, with his dyspepsia and escalating doses of meloxicam, there is concern for upper GI tract pathology producing this acute illness.  Mild bump in BUN also noted.  He does remain hemodynamically stable. Blood per rectum seems to have tapered off overnight.  Recommendations:  I have offered the patient an EGD today to determine whether or not patient has an occult upper GI tract lesion lesion to account for his presentation.  Depending on findings, he may or may not need an eventual colonoscopy.  The risks, benefits, limitations, alternatives and imponderables have been reviewed with the patient. Potential for esophageal dilation, biopsy, etc. have also been reviewed.  Questions have been answered. All parties agreeable.  Further recommendations to follow.

## 2018-03-13 DIAGNOSIS — K5790 Diverticulosis of intestine, part unspecified, without perforation or abscess without bleeding: Secondary | ICD-10-CM

## 2018-03-13 LAB — BASIC METABOLIC PANEL
Anion gap: 6 (ref 5–15)
BUN: 24 mg/dL — ABNORMAL HIGH (ref 6–20)
CHLORIDE: 108 mmol/L (ref 101–111)
CO2: 26 mmol/L (ref 22–32)
Calcium: 8.2 mg/dL — ABNORMAL LOW (ref 8.9–10.3)
Creatinine, Ser: 1.29 mg/dL — ABNORMAL HIGH (ref 0.61–1.24)
GFR calc non Af Amer: 55 mL/min — ABNORMAL LOW (ref >60)
Glucose, Bld: 107 mg/dL — ABNORMAL HIGH (ref 65–99)
POTASSIUM: 4.6 mmol/L (ref 3.5–5.1)
SODIUM: 140 mmol/L (ref 135–145)

## 2018-03-13 LAB — GLUCOSE, CAPILLARY
GLUCOSE-CAPILLARY: 96 mg/dL (ref 65–99)
GLUCOSE-CAPILLARY: 88 mg/dL (ref 65–99)
GLUCOSE-CAPILLARY: 93 mg/dL (ref 65–99)
GLUCOSE-CAPILLARY: 100 mg/dL — AB (ref 65–99)

## 2018-03-13 LAB — CBC
HEMATOCRIT: 28.1 % — AB (ref 39.0–52.0)
HEMOGLOBIN: 8.9 g/dL — AB (ref 13.0–17.0)
MCH: 29.6 pg (ref 26.0–34.0)
MCHC: 31.7 g/dL (ref 30.0–36.0)
MCV: 93.4 fL (ref 78.0–100.0)
Platelets: 145 10*3/uL — ABNORMAL LOW (ref 150–400)
RBC: 3.01 MIL/uL — AB (ref 4.22–5.81)
RDW: 14.7 % (ref 11.5–15.5)
WBC: 6.5 10*3/uL (ref 4.0–10.5)

## 2018-03-13 LAB — HEMOGLOBIN AND HEMATOCRIT, BLOOD
HEMATOCRIT: 29.5 % — AB (ref 39.0–52.0)
Hemoglobin: 9.4 g/dL — ABNORMAL LOW (ref 13.0–17.0)

## 2018-03-13 NOTE — Progress Notes (Signed)
Patient without complaints. He is hungry and wants his diet advanced. One small bloody stool overnight with a subsequent stool with minimal blood Hemoglobin at 8.9 this morning  Vital signs in last 24 hours: Temp:  [97.8 F (36.6 C)-98.7 F (37.1 C)] 98.7 F (37.1 C) (04/14 0551) Pulse Rate:  [45-58] 54 (04/14 0551) Resp:  [12-24] 16 (04/13 2100) BP: (83-151)/(44-80) 104/50 (04/14 0551) SpO2:  [93 %-100 %] 93 % (04/14 0551) Last BM Date: 03/12/18 General:   Alert,  Well-developed, well-nourished, pleasant and cooperative in NAD Abdomen:  Nondistended. Positive bowel sounds soft and nontender without appreciable mass or organomegaly. Extremities:  Without clubbing or edema.    Intake/Output from previous day: 04/13 0701 - 04/14 0700 In: 1836.7 [P.O.:1150; I.V.:686.7] Out: -  Intake/Output this shift: Total I/O In: 910 [P.O.:360; I.V.:550] Out: -   Lab Results: Recent Labs    03/12/18 0548  03/12/18 1927 03/13/18 0045 03/13/18 0647  WBC 5.7  --   --   --  6.5  HGB 12.1*   < > 10.4* 9.4* 8.9*  HCT 38.4*   < > 32.8* 29.5* 28.1*  PLT 185  --   --   --  145*   < > = values in this interval not displayed.   BMET Recent Labs    03/12/18 0548 03/13/18 0647  NA 140 140  K 3.8 4.6  CL 105 108  CO2 26 26  GLUCOSE 114* 107*  BUN 29* 24*  CREATININE 1.57* 1.29*  CALCIUM 8.7* 8.2*   LFT Recent Labs    03/12/18 0548  PROT 7.0  ALBUMIN 3.8  AST 21  ALT 16*  ALKPHOS 46  BILITOT 0.5   PT/INR Recent Labs    03/12/18 0548  LABPROT 12.5  INR 0.94   Hepatitis Panel No results for input(s): HEPBSAG, HCVAB, HEPAIGM, HEPBIGM in the last 72 hours. C-Diff No results for input(s): CDIFFTOX in the last 72 hours.  Studies/Results: Ct Abdomen Pelvis W Contrast  Result Date: 03/12/2018 CLINICAL DATA:  Epigastric abdominal pain. Rectal bleeding. Abdominal distension. EXAM: CT ABDOMEN AND PELVIS WITH CONTRAST TECHNIQUE: Multidetector CT imaging of the abdomen and pelvis  was performed using the standard protocol following bolus administration of intravenous contrast. CONTRAST:  69mL ISOVUE-300 IOPAMIDOL (ISOVUE-300) INJECTION 61% COMPARISON:  None. FINDINGS: Lower chest: Patchy opacity in the right lower lobe is only partially included in the field of view, image 1 series 4. Hepatobiliary: Punctate hepatic granuloma. No focal hepatic lesion. Calcified gallstones within partially distended gallbladder. No pericholecystic inflammation. No biliary dilatation. Pancreas: No ductal dilatation or inflammation. Spleen: Normal in size without focal abnormality. Adrenals/Urinary Tract: Bilateral adrenal thickening without dominant nodule. No hydronephrosis. Mild symmetric perinephric edema is likely chronic. Bilateral renal cysts including parapelvic cysts in the left kidney. A 17 mm cyst in the upper left kidney is mildly complex and contains a central calcification. Urinary bladder is physiologically distended without wall thickening. Stomach/Bowel: Stomach is nondistended. No small bowel inflammation, obstruction or wall thickening. Normal appendix. Multifocal colonic diverticulosis throughout the entire colon without acute diverticulitis. No colonic wall thickening or inflammatory change. Vascular/Lymphatic: Aorta bi-iliac atherosclerosis. No aneurysm. No enlarged abdominal or pelvic lymph nodes. Reproductive: Enlarged prostate gland spanning 5.6 cm transverse. Other: No free air or ascites. No intra-abdominal abscess. There is fat in the inguinal canals. Musculoskeletal: There are no acute or suspicious osseous abnormalities. Degenerative change in the lower lumbar spine with degenerative disc disease and facet arthropathy. IMPRESSION: 1. Multifocal colonic diverticulosis without  acute diverticulitis. No bowel inflammation or other explanation for GI bleed. No obstruction. 2. Gallstones without gallbladder inflammation. 3. Minimal patchy opacity in the right lower lobe is only partially  included, may be infectious or inflammatory. 4. Incidental findings of enlarged prostate gland and Aortic Atherosclerosis (ICD10-I70.0). Electronically Signed   By: Jeb Levering M.D.   On: 03/12/2018 06:48    Impression:  GI bleed. More likely primarily emanating from the colon. Recent NSAID use may have exacerbated/precipitated.  NSAID gastropathy and reflux esophagitis found on EGD yesterday but no significant bleeding lesion seen. Clinically, blood per rectum is tapering off. Current hemoglobin likely reflective of equilibration.  Recommendations:  Advance diet.  Observe today.  Follow-up on gastric biopsies. Avoidance of all NSAIDs.  Tentatively, planning for outpatient colonoscopy in several weeks. Home tomorrow from a GI standpoint if he is doing well.

## 2018-03-13 NOTE — Progress Notes (Signed)
PROGRESS NOTE  Kevin [name] LGX:[id number] DOB: [birth year] DOA: 03/12/2018 PCP: Lucianne Lei, MD  Brief History:  72 y.o. male with medical history of alcohol abuse, depression, hypothyroidism, hypertension, diabetes mellitus presenting with rectal bleeding that began around 6 PM on 03/11/2018.  The patient stated he ate some hot rice in the late afternoon on 03/11/2018.  Approximately 2 hours later, the patient had a bowel movement with maroon stool.  He subsequently had 2 additional bowel movements with maroon stool.  As result, he presented to emergency department for further evaluation.  He complains of some associated crampy abdominal pain.   The patient takes meloxicam approximately 3-4 tablets/week for arthritis type symptoms.  He denies any other NSAIDs.  He states that he drinks about 3-4 beers per week but denies any other alcohol or illegal drug use. CT of the abdomen and pelvis showed a punctate hepatic granuloma.  There was calcified gallstones with a partially distended gallbladder without pericholecystic fluid.  There was diverticulosis without any inflammatory changes.  GI was consulted to assist with management.   Assessment/Plan: Hematochezia -due to diverticular GI bleed--> improving -Patient states that he had a colonoscopy approximately 6 years ago performed by Dr. Benson Norway which pt states was "normal" -GI consult appreciate-->EGD -03/12/18 EGD--erosive esophagitis; nonobstructive shatzki's ring, erosive gastropathy; not likely the source of bleeding -Trend hemoglobin--drop due to equilibration -IV fluids>>>saline lock -advance diet to soft diet -Coags within normal limits  Essential hypertension -Holding HCTZ and olmesartan -continue lower dose amlodipine -Monitor BP--controlled  Diabetes mellitus type 2, controlled -Hemoglobin A1c--5.9 -NovoLog sliding scale -Holding metformin  CKD stage III -Baseline creatinine 1.2-1.4 -A.m. BMP  Alcohol  abuse -Alcohol withdrawal protocol -no signs of withdrawal  Hypothyroidism -Continue Synthroid  Depression -Continue Lexapro   Disposition Plan:   Home 4/15 if stable  Family Communication:   Sister updated at bedside  Consultants:  GI--Rourk  Code Status:  FULL   DVT Prophylaxis:  SCDs   Procedures: As Listed in Progress Note Above  Antibiotics: None    Subjective: Pt had BM last night with only small amount of blood.  He had a BM this morning and stated it was only "pink".  Patient denies fevers, chills, headache, chest pain, dyspnea, nausea, vomiting, diarrhea, abdominal pain, dysuria, hematuria, and melena.   Objective: Vitals:   03/12/18 2051 03/12/18 2100 03/13/18 0551 03/13/18 1516  BP: 134/70 133/65 (!) 104/50 127/66  Pulse: (!) 52 (!) 58 (!) 54 (!) 56  Resp:  16  17  Temp: 98.2 F (36.8 C) 98 F (36.7 C) 98.7 F (37.1 C) 99 F (37.2 C)  TempSrc: Oral  Oral Oral  SpO2: 98% 99% 93% 97%  Weight:      Height:        Intake/Output Summary (Last 24 hours) at 03/13/2018 1548 Last data filed at 03/13/2018 1514 Gross per 24 hour  Intake 2578.34 ml  Output -  Net 2578.34 ml   Weight change:  Exam:   General:  Pt is alert, follows commands appropriately, not in acute distress  HEENT: No icterus, No thrush, No neck mass, [place]/AT  Cardiovascular: RRR, S1/S2, no rubs, no gallops  Respiratory: diminished but CTA bilaterally, no wheezing, no crackles, no rhonchi  Abdomen: Soft/+BS, non tender, non distended, no guarding  Extremities: No edema, No lymphangitis, No petechiae, No rashes, no synovitis   Data Reviewed: I have personally reviewed following labs and imaging studies Basic Metabolic Panel: Recent  Labs  Lab 03/12/18 0548 03/13/18 0647  NA 140 140  K 3.8 4.6  CL 105 108  CO2 26 26  GLUCOSE 114* 107*  BUN 29* 24*  CREATININE 1.57* 1.29*  CALCIUM 8.7* 8.2*   Liver Function Tests: Recent Labs  Lab 03/12/18 0548  AST 21  ALT 16*   ALKPHOS 46  BILITOT 0.5  PROT 7.0  ALBUMIN 3.8   Recent Labs  Lab 03/12/18 0548  LIPASE 38   No results for input(s): AMMONIA in the last 168 hours. Coagulation Profile: Recent Labs  Lab 03/12/18 0548  INR 0.94   CBC: Recent Labs  Lab 03/12/18 0548 03/12/18 1337 03/12/18 1927 03/13/18 0045 03/13/18 0647  WBC 5.7  --   --   --  6.5  NEUTROABS 3.7  --   --   --   --   HGB 12.1* 11.0* 10.4* 9.4* 8.9*  HCT 38.4* 34.6* 32.8* 29.5* 28.1*  MCV 91.6  --   --   --  93.4  PLT 185  --   --   --  145*   Cardiac Enzymes: No results for input(s): CKTOTAL, CKMB, CKMBINDEX, TROPONINI in the last 168 hours. BNP: Invalid input(s): POCBNP CBG: Recent Labs  Lab 03/12/18 1152 03/12/18 1627 03/12/18 2120 03/13/18 0803 03/13/18 1139  GLUCAP 82 88 86 96 88   HbA1C: Recent Labs    03/12/18 0548  HGBA1C 5.9*   Urine analysis:    Component Value Date/Time   COLORURINE YELLOW 01/02/2014 1803   APPEARANCEUR HAZY (A) 01/02/2014 1803   LABSPEC 1.023 01/02/2014 1803   PHURINE 5.0 01/02/2014 1803   GLUCOSEU NEGATIVE 01/02/2014 1803   HGBUR NEGATIVE 01/02/2014 1803   BILIRUBINUR NEGATIVE 01/02/2014 1803   KETONESUR 15 (A) 01/02/2014 1803   PROTEINUR NEGATIVE 01/02/2014 1803   UROBILINOGEN 1.0 01/02/2014 1803   NITRITE NEGATIVE 01/02/2014 1803   LEUKOCYTESUR NEGATIVE 01/02/2014 1803   Sepsis Labs: @LABRCNTIP (procalcitonin:4,lacticidven:4) )No results found for this or any previous visit (from the past 240 hour(s)).   Scheduled Meds: . amLODipine  2.5 mg Oral QHS  . escitalopram  20 mg Oral BID  . folic acid  1 mg Oral Daily  . gabapentin  200 mg Oral TID  . insulin aspart  0-9 Units Subcutaneous TID WC  . levothyroxine  100 mcg Oral QHS  . multivitamin with minerals  1 tablet Oral Daily  . pantoprazole  40 mg Oral Daily  . thiamine  100 mg Oral Daily   Or  . thiamine  100 mg Intravenous Daily   Continuous Infusions:  Procedures/Studies: Ct Abdomen Pelvis W  Contrast  Result Date: 03/12/2018 CLINICAL DATA:  Epigastric abdominal pain. Rectal bleeding. Abdominal distension. EXAM: CT ABDOMEN AND PELVIS WITH CONTRAST TECHNIQUE: Multidetector CT imaging of the abdomen and pelvis was performed using the standard protocol following bolus administration of intravenous contrast. CONTRAST:  45mL ISOVUE-300 IOPAMIDOL (ISOVUE-300) INJECTION 61% COMPARISON:  None. FINDINGS: Lower chest: Patchy opacity in the right lower lobe is only partially included in the field of view, image 1 series 4. Hepatobiliary: Punctate hepatic granuloma. No focal hepatic lesion. Calcified gallstones within partially distended gallbladder. No pericholecystic inflammation. No biliary dilatation. Pancreas: No ductal dilatation or inflammation. Spleen: Normal in size without focal abnormality. Adrenals/Urinary Tract: Bilateral adrenal thickening without dominant nodule. No hydronephrosis. Mild symmetric perinephric edema is likely chronic. Bilateral renal cysts including parapelvic cysts in the left kidney. A 17 mm cyst in the upper left kidney is mildly complex and contains  a central calcification. Urinary bladder is physiologically distended without wall thickening. Stomach/Bowel: Stomach is nondistended. No small bowel inflammation, obstruction or wall thickening. Normal appendix. Multifocal colonic diverticulosis throughout the entire colon without acute diverticulitis. No colonic wall thickening or inflammatory change. Vascular/Lymphatic: Aorta bi-iliac atherosclerosis. No aneurysm. No enlarged abdominal or pelvic lymph nodes. Reproductive: Enlarged prostate gland spanning 5.6 cm transverse. Other: No free air or ascites. No intra-abdominal abscess. There is fat in the inguinal canals. Musculoskeletal: There are no acute or suspicious osseous abnormalities. Degenerative change in the lower lumbar spine with degenerative disc disease and facet arthropathy. IMPRESSION: 1. Multifocal colonic  diverticulosis without acute diverticulitis. No bowel inflammation or other explanation for GI bleed. No obstruction. 2. Gallstones without gallbladder inflammation. 3. Minimal patchy opacity in the right lower lobe is only partially included, may be infectious or inflammatory. 4. Incidental findings of enlarged prostate gland and Aortic Atherosclerosis (ICD10-I70.0). Electronically Signed   By: Jeb Levering M.D.   On: 03/12/2018 06:48    Orson Eva, DO  [facility] Pager [phone]  If 7PM-7AM, please contact night-coverage [email or web address] Password [email or web address] 03/13/2018, 3:48 PM   LOS: 1 day

## 2018-03-13 NOTE — Discharge Summary (Addendum)
Physician Discharge Summary  Kevin [name] SEG:[id number] DOB: [birth year] DOA: 03/12/2018  PCP: Lucianne Lei, MD  Admit date: 03/12/2018 Discharge date: 03/14/2018  Admitted From: Home Disposition:  Home   Recommendations for Outpatient Follow-up:  1. Follow up with PCP in 1-2 weeks 2. Please obtain BMP/CBC in one week   Discharge Condition: Stable CODE STATUS: FULL Diet recommendation: Heart Healthy / Carb Modified  Brief/Interim Summary: 72 y.o.malewith medical history ofalcohol abuse, depression, hypothyroidism, hypertension, diabetes mellitus presenting with rectal bleeding that began around 6 PM on 03/11/2018. The patient stated he ate some hot rice in the late afternoon on 03/11/2018. Approximately 2 hours later, the patient had a bowel movement with maroon stool. He subsequently had 2 additional bowel movements with maroon stool. As result, he presented to emergency department for further evaluation. He complains of some associated crampy abdominal pain.  The patient takes meloxicam approximately 3-4 tablets/week for arthritis type symptoms. He denies any other NSAIDs. He states that he drinks about 3-4 beers per week but denies any other alcohol or illegal drug use. CT of the abdomen and pelvis showed a punctate hepatic granuloma. There was calcified gallstones with a partially distended gallbladder without pericholecystic fluid. There was diverticulosis without any inflammatory changes.  GI was consulted to assist with management.  EGD was performed on 03/12/2018 which showed erosive esophagitis, erosive gastropathy, nonobstructive Schatzki's ring.  As result, it was felt that the source of bleeding was likely diverticular in origin.  Fortunately, the patient's hematochezia continued to improve throughout the hospitalization.    Discharge Diagnoses:  Diverticular GI bleed -due to diverticular GI bleed--> improving -Patient states that he had a colonoscopy approximately  6 years ago performed byDr. Benson Norway which pt states was "normal" -GI consult appreciate-->EGD -03/12/18 EGD--erosive esophagitis; nonobstructive shatzki's ring, erosive gastropathy; not likely the source of bleeding -Trend hemoglobin--drop due to equilibration -since equilibration, patient's Hgb remained stable -Hgb 8.1 on day of d/c -IV fluids>>>saline lock -advance diet to soft diet--pt tolerated -Coags within normal limits  Essential hypertension -Holding HCTZ and olmesartan--restart after d/c -continue lower dose amlodipine -Monitor BP--controlled  Diabetes mellitus type 2, controlled -Hemoglobin A1c--5.9 -NovoLog sliding scale -Holding metformin--restart after d/c with close monitoring of renal function  CKD stage III -Baseline creatinine 1.2-1.4 -serum creatinine 1.37 on day of d/c  Alcohol abuse -Alcohol withdrawal protocol -no signs of withdrawal  Hypothyroidism -Continue Synthroid  Depression -Continue Lexapro      Discharge Instructions   Allergies as of 03/14/2018      Reactions   Trazodone And Nefazodone    Unable to walk      Medication List    STOP taking these medications   Colchicine 0.6 MG Caps   meloxicam 15 MG tablet Commonly known as:  MOBIC     TAKE these medications   alum & mag hydroxide-simeth 200-200-20 MG/5ML suspension Commonly known as:  MAALOX/MYLANTA Take 30 mLs by mouth every 6 (six) hours as needed for indigestion or heartburn.   escitalopram 20 MG tablet Commonly known as:  LEXAPRO Take 1 tablet (20 mg total) by mouth 2 (two) times daily.   gabapentin 100 MG capsule Commonly known as:  NEURONTIN TAKE 2 CAPSULES BY MOUTH 3 TIMES DAILY. FOR SUBSTANCE WITHDRAWAL SYNDROME, ANXIETY/PAIN   levothyroxine 100 MCG tablet Commonly known as:  SYNTHROID, LEVOTHROID Take 1 tablet (100 mcg total) by mouth daily. For thyroid hormone replacement   metFORMIN 1000 MG tablet Commonly known as:  GLUCOPHAGE Take 1,000 mg by  mouth at  bedtime.   Olmesartan-amLODIPine-HCTZ 40-10-12.5 MG Tabs Commonly known as:  TRIBENZOR Take 1 tablet by mouth daily. For hypertension       Allergies  Allergen Reactions  . Trazodone And Nefazodone     Unable to walk    Consultations:  GI--Rourk   Procedures/Studies: Ct Abdomen Pelvis W Contrast  Result Date: 03/12/2018 CLINICAL DATA:  Epigastric abdominal pain. Rectal bleeding. Abdominal distension. EXAM: CT ABDOMEN AND PELVIS WITH CONTRAST TECHNIQUE: Multidetector CT imaging of the abdomen and pelvis was performed using the standard protocol following bolus administration of intravenous contrast. CONTRAST:  20mL ISOVUE-300 IOPAMIDOL (ISOVUE-300) INJECTION 61% COMPARISON:  None. FINDINGS: Lower chest: Patchy opacity in the right lower lobe is only partially included in the field of view, image 1 series 4. Hepatobiliary: Punctate hepatic granuloma. No focal hepatic lesion. Calcified gallstones within partially distended gallbladder. No pericholecystic inflammation. No biliary dilatation. Pancreas: No ductal dilatation or inflammation. Spleen: Normal in size without focal abnormality. Adrenals/Urinary Tract: Bilateral adrenal thickening without dominant nodule. No hydronephrosis. Mild symmetric perinephric edema is likely chronic. Bilateral renal cysts including parapelvic cysts in the left kidney. A 17 mm cyst in the upper left kidney is mildly complex and contains a central calcification. Urinary bladder is physiologically distended without wall thickening. Stomach/Bowel: Stomach is nondistended. No small bowel inflammation, obstruction or wall thickening. Normal appendix. Multifocal colonic diverticulosis throughout the entire colon without acute diverticulitis. No colonic wall thickening or inflammatory change. Vascular/Lymphatic: Aorta bi-iliac atherosclerosis. No aneurysm. No enlarged abdominal or pelvic lymph nodes. Reproductive: Enlarged prostate gland spanning 5.6 cm  transverse. Other: No free air or ascites. No intra-abdominal abscess. There is fat in the inguinal canals. Musculoskeletal: There are no acute or suspicious osseous abnormalities. Degenerative change in the lower lumbar spine with degenerative disc disease and facet arthropathy. IMPRESSION: 1. Multifocal colonic diverticulosis without acute diverticulitis. No bowel inflammation or other explanation for GI bleed. No obstruction. 2. Gallstones without gallbladder inflammation. 3. Minimal patchy opacity in the right lower lobe is only partially included, may be infectious or inflammatory. 4. Incidental findings of enlarged prostate gland and Aortic Atherosclerosis (ICD10-I70.0). Electronically Signed   By: Jeb Levering M.D.   On: 03/12/2018 06:48        Discharge Exam: Vitals:   03/13/18 2110 03/14/18 0545  BP: 128/61 (!) 153/65  Pulse: (!) 54 68  Resp:  18  Temp: 98.4 F (36.9 C) 99.2 F (37.3 C)  SpO2: 97% 97%   Vitals:   03/13/18 0551 03/13/18 1516 03/13/18 2110 03/14/18 0545  BP: (!) 104/50 127/66 128/61 (!) 153/65  Pulse: (!) 54 (!) 56 (!) 54 68  Resp:  17  18  Temp: 98.7 F (37.1 C) 99 F (37.2 C) 98.4 F (36.9 C) 99.2 F (37.3 C)  TempSrc: Oral Oral Oral Oral  SpO2: 93% 97% 97% 97%  Weight:      Height:        General: Pt is alert, awake, not in acute distress Cardiovascular: RRR, S1/S2 +, no rubs, no gallops Respiratory: CTA bilaterally, no wheezing, no rhonchi Abdominal: Soft, NT, ND, bowel sounds + Extremities: no edema, no cyanosis   The results of significant diagnostics from this hospitalization (including imaging, microbiology, ancillary and laboratory) are listed below for reference.    Significant Diagnostic Studies: Ct Abdomen Pelvis W Contrast  Result Date: 03/12/2018 CLINICAL DATA:  Epigastric abdominal pain. Rectal bleeding. Abdominal distension. EXAM: CT ABDOMEN AND PELVIS WITH CONTRAST TECHNIQUE: Multidetector CT imaging of the abdomen and pelvis  was performed using the standard protocol following bolus administration of intravenous contrast. CONTRAST:  28mL ISOVUE-300 IOPAMIDOL (ISOVUE-300) INJECTION 61% COMPARISON:  None. FINDINGS: Lower chest: Patchy opacity in the right lower lobe is only partially included in the field of view, image 1 series 4. Hepatobiliary: Punctate hepatic granuloma. No focal hepatic lesion. Calcified gallstones within partially distended gallbladder. No pericholecystic inflammation. No biliary dilatation. Pancreas: No ductal dilatation or inflammation. Spleen: Normal in size without focal abnormality. Adrenals/Urinary Tract: Bilateral adrenal thickening without dominant nodule. No hydronephrosis. Mild symmetric perinephric edema is likely chronic. Bilateral renal cysts including parapelvic cysts in the left kidney. A 17 mm cyst in the upper left kidney is mildly complex and contains a central calcification. Urinary bladder is physiologically distended without wall thickening. Stomach/Bowel: Stomach is nondistended. No small bowel inflammation, obstruction or wall thickening. Normal appendix. Multifocal colonic diverticulosis throughout the entire colon without acute diverticulitis. No colonic wall thickening or inflammatory change. Vascular/Lymphatic: Aorta bi-iliac atherosclerosis. No aneurysm. No enlarged abdominal or pelvic lymph nodes. Reproductive: Enlarged prostate gland spanning 5.6 cm transverse. Other: No free air or ascites. No intra-abdominal abscess. There is fat in the inguinal canals. Musculoskeletal: There are no acute or suspicious osseous abnormalities. Degenerative change in the lower lumbar spine with degenerative disc disease and facet arthropathy. IMPRESSION: 1. Multifocal colonic diverticulosis without acute diverticulitis. No bowel inflammation or other explanation for GI bleed. No obstruction. 2. Gallstones without gallbladder inflammation. 3. Minimal patchy opacity in the right lower lobe is only partially  included, may be infectious or inflammatory. 4. Incidental findings of enlarged prostate gland and Aortic Atherosclerosis (ICD10-I70.0). Electronically Signed   By: Jeb Levering M.D.   On: 03/12/2018 06:48     Microbiology: No results found for this or any previous visit (from the past 240 hour(s)).   Labs: Basic Metabolic Panel: Recent Labs  Lab 03/12/18 0548 03/13/18 0647 03/14/18 0614  NA 140 140 136  K 3.8 4.6 4.6  CL 105 108 104  CO2 26 26 25   GLUCOSE 114* 107* 120*  BUN 29* 24* 23*  CREATININE 1.57* 1.29* 1.37*  CALCIUM 8.7* 8.2* 8.3*   Liver Function Tests: Recent Labs  Lab 03/12/18 0548  AST 21  ALT 16*  ALKPHOS 46  BILITOT 0.5  PROT 7.0  ALBUMIN 3.8   Recent Labs  Lab 03/12/18 0548  LIPASE 38   No results for input(s): AMMONIA in the last 168 hours. CBC: Recent Labs  Lab 03/12/18 0548 03/12/18 1337 03/12/18 1927 03/13/18 0045 03/13/18 0647 03/14/18 0614  WBC 5.7  --   --   --  6.5 7.4  NEUTROABS 3.7  --   --   --   --   --   HGB 12.1* 11.0* 10.4* 9.4* 8.9* 8.1*  HCT 38.4* 34.6* 32.8* 29.5* 28.1* 25.1*  MCV 91.6  --   --   --  93.4 92.6  PLT 185  --   --   --  145* 145*   Cardiac Enzymes: No results for input(s): CKTOTAL, CKMB, CKMBINDEX, TROPONINI in the last 168 hours. BNP: Invalid input(s): POCBNP CBG: Recent Labs  Lab 03/13/18 0803 03/13/18 1139 03/13/18 1647 03/13/18 2113 03/14/18 0732  GLUCAP 96 88 93 100* 106*    Time coordinating discharge:  36 minutes  Signed:  Orson Eva, DO [facility] Pager: [phone] 03/14/2018, 10:25 AM

## 2018-03-13 NOTE — Progress Notes (Signed)
Patient had one bloody bowel movement during the night.  Vitals otherwise stable and patient had no complaints during shift.

## 2018-03-14 ENCOUNTER — Telehealth: Payer: Self-pay | Admitting: Gastroenterology

## 2018-03-14 DIAGNOSIS — K922 Gastrointestinal hemorrhage, unspecified: Secondary | ICD-10-CM

## 2018-03-14 LAB — GLUCOSE, CAPILLARY: GLUCOSE-CAPILLARY: 106 mg/dL — AB (ref 65–99)

## 2018-03-14 LAB — BASIC METABOLIC PANEL
ANION GAP: 7 (ref 5–15)
BUN: 23 mg/dL — ABNORMAL HIGH (ref 6–20)
CHLORIDE: 104 mmol/L (ref 101–111)
CO2: 25 mmol/L (ref 22–32)
Calcium: 8.3 mg/dL — ABNORMAL LOW (ref 8.9–10.3)
Creatinine, Ser: 1.37 mg/dL — ABNORMAL HIGH (ref 0.61–1.24)
GFR calc non Af Amer: 51 mL/min — ABNORMAL LOW (ref >60)
GFR, EST AFRICAN AMERICAN: 59 mL/min — AB (ref >60)
Glucose, Bld: 120 mg/dL — ABNORMAL HIGH (ref 65–99)
Potassium: 4.6 mmol/L (ref 3.5–5.1)
SODIUM: 136 mmol/L (ref 135–145)

## 2018-03-14 LAB — CBC
HEMATOCRIT: 25.1 % — AB (ref 39.0–52.0)
HEMOGLOBIN: 8.1 g/dL — AB (ref 13.0–17.0)
MCH: 29.9 pg (ref 26.0–34.0)
MCHC: 32.3 g/dL (ref 30.0–36.0)
MCV: 92.6 fL (ref 78.0–100.0)
Platelets: 145 10*3/uL — ABNORMAL LOW (ref 150–400)
RBC: 2.71 MIL/uL — ABNORMAL LOW (ref 4.22–5.81)
RDW: 14.5 % (ref 11.5–15.5)
WBC: 7.4 10*3/uL (ref 4.0–10.5)

## 2018-03-14 NOTE — Progress Notes (Signed)
    Subjective: Small amount of dark red blood overnight, "much improved" per patient. No abdominal pain. No N/V. Eager to go home today. Wants Hgb checked as outpatient.   Objective: Vital signs in last 24 hours: Temp:  [98.4 F (36.9 C)-99.2 F (37.3 C)] 99.2 F (37.3 C) (04/15 0545) Pulse Rate:  [54-68] 68 (04/15 0545) Resp:  [17-18] 18 (04/15 0545) BP: (127-153)/(61-66) 153/65 (04/15 0545) SpO2:  [97 %] 97 % (04/15 0545) Last BM Date: 03/13/18 General:   Alert and oriented, pleasant Head:  Normocephalic and atraumatic. Abdomen:  Bowel sounds present, soft, non-tender, non-distended. No HSM or hernias noted. No rebound or guarding. No masses appreciated  Msk:  Symmetrical without gross deformities. Normal posture. Neurologic:  Alert and  oriented x4 Psych:  Alert and cooperative. Normal mood and affect.  Intake/Output from previous day: 04/14 0701 - 04/15 0700 In: 2011.7 [P.O.:1200; I.V.:811.7] Out: -  Intake/Output this shift: No intake/output data recorded.  Lab Results: Recent Labs    03/12/18 0548  03/13/18 0045 03/13/18 0647 03/14/18 0614  WBC 5.7  --   --  6.5 7.4  HGB 12.1*   < > 9.4* 8.9* 8.1*  HCT 38.4*   < > 29.5* 28.1* 25.1*  PLT 185  --   --  145* 145*   < > = values in this interval not displayed.   BMET Recent Labs    03/12/18 0548 03/13/18 0647 03/14/18 0614  NA 140 140 136  K 3.8 4.6 4.6  CL 105 108 104  CO2 26 26 25   GLUCOSE 114* 107* 120*  BUN 29* 24* 23*  CREATININE 1.57* 1.29* 1.37*  CALCIUM 8.7* 8.2* 8.3*   LFT Recent Labs    03/12/18 0548  PROT 7.0  ALBUMIN 3.8  AST 21  ALT 16*  ALKPHOS 46  BILITOT 0.5   PT/INR Recent Labs    03/12/18 0548  LABPROT 12.5  INR 0.94    Assessment: 72 year old male admitted with GI bleed in setting of NSAID use, s/p EGD 4/13 with erosive reflux esophagitis, salmon-colored mucosa s/p biopsy, non-obstructing Schatzki's ring, erosive gastropathy. Likely lower GI source, diverticular  origin. Small amount of dark blood overnight, which patient describes as much decreased. He is eager to go home today. Will need outpatient colonoscopy, which we will arrange. Hgb 8.1 (yesterday 8.9). Clinically improving overall. Will arrange outpatient follow-up in the office.   Plan: Avoid NSAIDs Pathology pending Hopeful discharge soon: will arrange close outpatient follow-up in office to arrange colonoscopy Check CBC later this week: our office to arrange   Annitta Needs, PhD, ANP-BC [facility]    LOS: 2 days    03/14/2018, 8:04 AM

## 2018-03-14 NOTE — Progress Notes (Signed)
Pt discharged home today per Dr. Carles Collet. Pt's IV site D/C'd and WDL. Pt's VSS. Pt provided with home medication list and discharge instructions. Verbalized understanding. Pt left floor via WC in stable condition accompanied by NT.

## 2018-03-14 NOTE — Telephone Encounter (Signed)
Alicia: please have patient complete CBC on Wednesday, latest Thursday (since we are off Friday). He was discharged from hospital on 4/15. We are just making sure it is stable.  Stacey: needs follow-up in next 2-3 weeks, can use urgent, needs colonoscopy as outpatient.

## 2018-03-14 NOTE — Plan of Care (Signed)
Patient continues to progress 

## 2018-03-15 ENCOUNTER — Encounter: Payer: Self-pay | Admitting: Gastroenterology

## 2018-03-15 NOTE — Telephone Encounter (Signed)
Patient scheduled and letter sent  °

## 2018-03-16 ENCOUNTER — Encounter (HOSPITAL_COMMUNITY): Payer: Self-pay | Admitting: Internal Medicine

## 2018-03-19 ENCOUNTER — Encounter: Payer: Self-pay | Admitting: Internal Medicine

## 2018-03-22 NOTE — Telephone Encounter (Signed)
I'm sorry, I didn't route this to [place]:  Can we have patient complete CBC? Just discharged from hospital 4/15.

## 2018-03-23 ENCOUNTER — Other Ambulatory Visit: Payer: Self-pay

## 2018-03-23 DIAGNOSIS — K922 Gastrointestinal hemorrhage, unspecified: Secondary | ICD-10-CM

## 2018-03-23 NOTE — Telephone Encounter (Signed)
Pt returned call and will have labs drawn.

## 2018-03-23 NOTE — Telephone Encounter (Signed)
Lmom, waiting on a return call.  

## 2018-03-25 DIAGNOSIS — K922 Gastrointestinal hemorrhage, unspecified: Secondary | ICD-10-CM | POA: Diagnosis not present

## 2018-03-26 LAB — CBC WITH DIFFERENTIAL/PLATELET
Basophils Absolute: 40 cells/uL (ref 0–200)
Basophils Relative: 0.5 %
EOS PCT: 4.2 %
Eosinophils Absolute: 332 cells/uL (ref 15–500)
HEMATOCRIT: 19.9 % — AB (ref 38.5–50.0)
HEMOGLOBIN: 6.5 g/dL — AB (ref 13.2–17.1)
Lymphs Abs: 1730 cells/uL (ref 850–3900)
MCH: 28.8 pg (ref 27.0–33.0)
MCHC: 32.7 g/dL (ref 32.0–36.0)
MCV: 88.1 fL (ref 80.0–100.0)
MPV: 10 fL (ref 7.5–12.5)
Monocytes Relative: 4.8 %
Neutro Abs: 5419 cells/uL (ref 1500–7800)
Neutrophils Relative %: 68.6 %
Platelets: 546 10*3/uL — ABNORMAL HIGH (ref 140–400)
RBC: 2.26 10*6/uL — AB (ref 4.20–5.80)
RDW: 14.1 % (ref 11.0–15.0)
TOTAL LYMPHOCYTE: 21.9 %
WBC: 7.9 10*3/uL (ref 3.8–10.8)
WBCMIX: 379 {cells}/uL (ref 200–950)

## 2018-03-27 NOTE — Progress Notes (Signed)
I was out of the office on Friday, and these labs were resulted after hours Friday evening. It is unclear if the lab called an on call provider over the weekend. I happened to see this in preparation for work on 4/29, and I have tried to call patient on all of his listed numbers. No voice mails available. Previously inpatient with presumed diverticular bleed. Will need to call patient again and find out symptoms: weakness, fatigue, etc and have a STAT CBC repeated Monday. Hgb is down from 8.1 2 weeks ago to now 6.5.

## 2018-03-28 ENCOUNTER — Other Ambulatory Visit: Payer: Self-pay

## 2018-03-28 ENCOUNTER — Telehealth: Payer: Self-pay

## 2018-03-28 DIAGNOSIS — D649 Anemia, unspecified: Secondary | ICD-10-CM

## 2018-03-28 LAB — CBC WITH DIFFERENTIAL/PLATELET
Basophils Absolute: 18 cells/uL (ref 0–200)
Basophils Relative: 0.3 %
Eosinophils Absolute: 301 cells/uL (ref 15–500)
Eosinophils Relative: 5.1 %
HCT: 20.6 % — ABNORMAL LOW (ref 38.5–50.0)
Hemoglobin: 6.7 g/dL — ABNORMAL LOW (ref 13.2–17.1)
Lymphs Abs: 1623 cells/uL (ref 850–3900)
MCH: 28.5 pg (ref 27.0–33.0)
MCHC: 32.5 g/dL (ref 32.0–36.0)
MCV: 87.7 fL (ref 80.0–100.0)
MPV: 10 fL (ref 7.5–12.5)
Monocytes Relative: 4.2 %
NEUTROS PCT: 62.9 %
Neutro Abs: 3711 cells/uL (ref 1500–7800)
PLATELETS: 540 10*3/uL — AB (ref 140–400)
RBC: 2.35 10*6/uL — AB (ref 4.20–5.80)
RDW: 14.1 % (ref 11.0–15.0)
TOTAL LYMPHOCYTE: 27.5 %
WBC: 5.9 10*3/uL (ref 3.8–10.8)
WBCMIX: 248 {cells}/uL (ref 200–950)

## 2018-03-28 NOTE — Progress Notes (Signed)
Patient coming Thursday AM to see Randall Hiss

## 2018-03-28 NOTE — Progress Notes (Signed)
From what I understand, no overt GI bleeding. Was inpatient a few weeks ago with likely diverticular bleed. Had EGD done while inpatient. He has symptomatic anemia. We are unable to do an outpatient transfusion in a timely fashion. Unfortunately, I recommend ED evaluation and blood transfusion. I don't see a need for admission unless he is definitely having overt GI bleeding or other issues going on. Erline Levine: We need to get him into the clinic to be seen sooner than what is on schedule. May use urgent. Within a week's time would be best.

## 2018-03-28 NOTE — Telephone Encounter (Signed)
Called pt back and he is aware of his H & H results. Pt is on his way to the ED.

## 2018-03-28 NOTE — Telephone Encounter (Signed)
Received a critical lab report from [facility] for pts H 6.7 & Hemat 20.6. AB is aware of results and asked if we could call to [facility] and let the charge nurse know about pts results prior to sending the pt to the ED. Charge Nurse is aware of pts results and is aware that pt is suppos to go to the ED.  Tried calling pt a few times. Pt doesn't have a VM set up on either contact number. I will continue to contact pt. Pt needs to go to the ED.

## 2018-03-28 NOTE — Telephone Encounter (Signed)
Critical labs will be faxed in 5 mins. Hemoglobin 6.7, Hematocrit 20.6. Routing message to AB.

## 2018-03-28 NOTE — ED Notes (Signed)
Marylee Floras called and reports pt had critically low hemoglobin over the weekend, they had it repeated and it is still low.  Reports instructed pt to come to ER.

## 2018-03-29 NOTE — Telephone Encounter (Signed)
Tried calling again. No answer.

## 2018-03-29 NOTE — Telephone Encounter (Signed)
Pt was suppose to go to the ED. I'll check with him today.

## 2018-03-29 NOTE — Telephone Encounter (Signed)
Did he go to a different ED than [facility]? I don't see where he went here.

## 2018-03-29 NOTE — Telephone Encounter (Signed)
Tried calling pt, not able to leave a VM 

## 2018-03-29 NOTE — Telephone Encounter (Signed)
Tried calling pt. No answer or VM, not able to leave a message. Will try again.

## 2018-03-30 NOTE — Telephone Encounter (Signed)
Noted. Spoke with Hoyle Sauer at [facility], faxed paper work. Pt can't be type and screen today. Will discuss further with EG when pt comes in to appointment tomorrow 03/31/18.

## 2018-03-30 NOTE — Telephone Encounter (Signed)
AB spoke with pt at 12:30 PM today. Pt didn't go to the emergency room. He was getting dizzy and feels better, so he didn't go. He's coming to his appointment with EG tomorrow 03/31/18.

## 2018-03-30 NOTE — Telephone Encounter (Signed)
He needs a transfusion. He was symptomatic. If he is not willing to go to the ED, needs 1 unit PRBCs THIS WEEK (can we do 5/2 after appt? ) as outpatient.  Would need type and screen today. Let's see if we can have this done tomorrow.

## 2018-03-31 ENCOUNTER — Ambulatory Visit: Payer: Medicare Other | Admitting: Nurse Practitioner

## 2018-03-31 ENCOUNTER — Other Ambulatory Visit: Payer: Self-pay

## 2018-03-31 ENCOUNTER — Encounter: Payer: Self-pay | Admitting: Nurse Practitioner

## 2018-03-31 ENCOUNTER — Encounter (HOSPITAL_COMMUNITY)
Admission: RE | Admit: 2018-03-31 | Discharge: 2018-03-31 | Disposition: A | Discharge status: Home / Self Care | Payer: Medicare Other | Source: Ambulatory Visit | Attending: Gastroenterology | Admitting: Gastroenterology

## 2018-03-31 VITALS — BP 110/62 | HR 66 | Temp 98.5°F | Ht 72.0 in | Wt 225.2 lb

## 2018-03-31 DIAGNOSIS — K5790 Diverticulosis of intestine, part unspecified, without perforation or abscess without bleeding: Secondary | ICD-10-CM

## 2018-03-31 DIAGNOSIS — D62 Acute posthemorrhagic anemia: Secondary | ICD-10-CM | POA: Insufficient documentation

## 2018-03-31 DIAGNOSIS — K625 Hemorrhage of anus and rectum: Secondary | ICD-10-CM | POA: Diagnosis not present

## 2018-03-31 LAB — ABO/RH: ABO/RH(D): A POS

## 2018-03-31 LAB — HEMOGLOBIN AND HEMATOCRIT, BLOOD
HCT: 22.9 % — ABNORMAL LOW (ref 39.0–52.0)
HEMOGLOBIN: 7.1 g/dL — AB (ref 13.0–17.0)

## 2018-03-31 LAB — PREPARE RBC (CROSSMATCH)

## 2018-03-31 NOTE — Progress Notes (Signed)
Here for blood draw for blood transfusion on 04/01/18. Procedure explained. Consent signed. Blood drawn and sent to lab for results. Voiced understanding.

## 2018-03-31 NOTE — Progress Notes (Signed)
Results for THOMOS, DOMINE (MRN [id number]) as of 03/31/2018 13:16  Ref. Range 03/31/2018 10:51  Hemoglobin Latest Ref Range: 13.0 - 17.0 g/dL 7.1 (L)  HCT Latest Ref Range: 39.0 - 52.0 % 22.9 (L)

## 2018-03-31 NOTE — Assessment & Plan Note (Signed)
Noted episodes of rectal bleeding in the hospital presumed to be diverticular in origin.  Noted maroon stools.  He did have 3-4 additional rectal bleeding episodes after discharge.  His hemoglobin at discharge was 8.1 and this is since dropped.  We are planning outpatient blood transfusion today or tomorrow due to significant acute blood loss anemia.  Recheck CBC in 2 weeks.  He states he is not having any more rectal bleeding.  Symptoms have improved including fatigue and weakness.  Recommend he call us for any noted recurrent bleeding.  Follow-up in 2 months.

## 2018-03-31 NOTE — Assessment & Plan Note (Signed)
Known history of diverticulosis.  Family history of colon cancer in his father who passed away in his mid 2s.  His last colonoscopy was about 5 years ago and he feels that he is due now.  We will have him follow-up in 2 months to check on his hemoglobin and to plan for colonoscopy.  I feel it would be better to wait for his acute anemia to resolve before proceeding with colonoscopy.  He is in agreement with this.

## 2018-03-31 NOTE — Progress Notes (Signed)
Referring Provider: Lucianne Lei, MD Primary Care Physician:  Lucianne Lei, MD Primary GI:  Dr. Gala Romney  Chief Complaint  Patient presents with  . hfu    rectal bleeding has stopped    HPI:   Kevin [name] is a 72 y.o. male who presents for hospital follow-up.  The patient was admitted to the hospital from 03/12/2018 through 03/14/2018.  History of alcohol abuse, depression, hypothyroidism, hypertension, diabetes.  The patient presented to the emergency department complaining of rectal bleeding, noted maroon stool.  2 additional bowel movements after initial incident with maroon stool as well.  Noted to be on meloxicam, no other NSAIDs.  Drinks 3-4 beers a week but no other alcohol or illegal drug use.  CT imaging of the abdomen and pelvis showed punctate hepatic granuloma, calcified gallstones with partially distended gallbladder without pericholecystic fluid.  Diverticulosis without inflammatory changes.  EGD was performed on 03/12/2018 which showed erosive esophagitis, erosive gastropathy, nonobstructing Schatzki's ring.  Source of GI bleed felt to be diverticular.  Hematochezia progressively improved during hospitalization.  The patient's hemoglobin was 8.1 on the day of discharge.  Coagulation labs normal, no signs of alcohol withdrawal during hospitalization.  On discharge recommended to stop meloxicam.  Posthospitalization labs completed 03/25/2018 which found a significant decline in hemoglobin to 6.5.  Unable to reach patient, multiple message left.  Recommended stat CBC repeat 4/29 which was completed and found slight increase to hemoglobin of 6.7.  Patient stated he had no overt GI bleeding noticed.  He was somewhat symptomatic.  Recommended ED evaluation and transfusion, but the patient declined.  Subsequently recommended outpatient transfusion.  Today he states he's doing so-so. Noted some more bleeding after discharge for a 3-4 episodes. Since has stopped bleeding. Was having weakness,  fatigue associated with this. Today, states weakness/fatigue has improved. Denies shortness of breath, chest pain. No more bleeding, no black stools. Denies abdominal pain, N/V, fever, chills, unintentional weight loss. Denies dizziness, lightheadedness, syncope, near syncope. Denies any other upper or lower GI symptoms.  Last colonoscopy was over 5 years ago, feels he's about due for a colonoscopy due to father with colon ca in his 40s.  Past Medical History:  Diagnosis Date  . Alcohol abuse   . Arthritis   . Diabetes mellitus without complication ([place])   . Diabetes mellitus, type II ([place])   . GERD (gastroesophageal reflux disease)   . Gout   . Hypertension   . Hyperthyroidism     Past Surgical History:  Procedure Laterality Date  . BIOPSY  03/12/2018   Procedure: BIOPSY;  Surgeon: Daneil Dolin, MD;  Location: [facility];  Service: Endoscopy;;  gastric  . ESOPHAGOGASTRODUODENOSCOPY N/A 03/12/2018   Procedure: ESOPHAGOGASTRODUODENOSCOPY (EGD);  Surgeon: Daneil Dolin, MD;  Location: [facility];  Service: Endoscopy;  Laterality: N/A;  . NO PAST SURGERIES      Current Outpatient Medications  Medication Sig Dispense Refill  . alum & mag hydroxide-simeth (MAALOX/MYLANTA) 200-200-20 MG/5ML suspension Take 30 mLs by mouth every 6 (six) hours as needed for indigestion or heartburn.    . escitalopram (LEXAPRO) 20 MG tablet Take 1 tablet (20 mg total) by mouth 2 (two) times daily. 180 tablet 2  . gabapentin (NEURONTIN) 100 MG capsule TAKE 2 CAPSULES BY MOUTH 3 TIMES DAILY. FOR SUBSTANCE WITHDRAWAL SYNDROME, ANXIETY/PAIN 180 capsule 2  . levothyroxine (SYNTHROID, LEVOTHROID) 100 MCG tablet Take 1 tablet (100 mcg total) by mouth daily. For thyroid hormone replacement    .  metFORMIN (GLUCOPHAGE) 1000 MG tablet Take 1,000 mg by mouth at bedtime.  3  . Olmesartan-Amlodipine-HCTZ (TRIBENZOR) 40-10-12.5 MG TABS Take 1 tablet by mouth daily. For hypertension 30 tablet    No current  facility-administered medications for this visit.     Allergies as of 03/31/2018 - Review Complete 03/31/2018  Allergen Reaction Noted  . Trazodone and nefazodone  01/31/2014    Family History  Problem Relation Age of Onset  . Colon cancer Father 41  . Alcohol abuse Paternal Aunt   . Drug abuse Paternal Uncle   . Alcohol abuse Paternal Uncle     Social History   Socioeconomic History  . Marital status: Single    Spouse name: Not on file  . Number of children: Not on file  . Years of education: Not on file  . Highest education level: Not on file  Occupational History  . Not on file  Social Needs  . Financial resource strain: Not on file  . Food insecurity:    Worry: Not on file    Inability: Not on file  . Transportation needs:    Medical: Not on file    Non-medical: Not on file  Tobacco Use  . Smoking status: Never Smoker  . Smokeless tobacco: Never Used  Substance and Sexual Activity  . Alcohol use: Not Currently    Comment: Last use 02/28/18; previously drank beer 'every now and then' Drank more heavily remotely.  . Drug use: No    Types: Benzodiazepines, Hydrocodone, Oxycodone  . Sexual activity: Yes  Lifestyle  . Physical activity:    Days per week: Not on file    Minutes per session: Not on file  . Stress: Not on file  Relationships  . Social connections:    Talks on phone: Not on file    Gets together: Not on file    Attends religious service: Not on file    Active member of club or organization: Not on file    Attends meetings of clubs or organizations: Not on file    Relationship status: Not on file  Other Topics Concern  . Not on file  Social History Narrative  . Not on file    Review of Systems: General: Negative for anorexia, weight loss, fever, chills, fatigue, weakness. ENT: Negative for hoarseness, difficulty swallowing. CV: Negative for chest pain, angina, palpitations, peripheral edema.  Respiratory: Negative for dyspnea at rest, cough,  sputum, wheezing.  GI: See history of present illness. Derm: Negative for rash or itching.  Neuro: Negative for weakness, abnormal sensation, seizure, frequent headaches, memory loss, confusion.  Psych: Negative for anxiety, depression, suicidal ideation, hallucinations.  Endo: Negative for unusual weight change.  Heme: Negative for bruising or bleeding.   Physical Exam: BP 110/62   Pulse 66   Temp 98.5 F (36.9 C) (Oral)   Ht 6' (1.829 m)   Wt 225 lb 3.2 oz (102.2 kg)   BMI 30.54 kg/m  General:   Alert and oriented. Pleasant and cooperative. Well-nourished and well-developed.  Eyes:  Without icterus, sclera clear and conjunctiva pink.  Ears:  Normal auditory acuity. Cardiovascular:  S1, S2 present without murmurs appreciated. Extremities without clubbing or edema. Respiratory:  Clear to auscultation bilaterally. No wheezes, rales, or rhonchi. No distress.  Gastrointestinal:  +BS, soft, non-tender and non-distended. No HSM noted. No guarding or rebound. No masses appreciated.  Rectal:  Deferred  Musculoskalatal:  Symmetrical without gross deformities. Neurologic:  Alert and oriented x4;  grossly normal neurologically.  Psych:  Alert and cooperative. Normal mood and affect. Heme/Lymph/Immune: No excessive bruising noted.    03/31/2018 9:50 AM   Disclaimer: This note was dictated with voice recognition software. Similar sounding words can inadvertently be transcribed and may not be corrected upon review.

## 2018-03-31 NOTE — Telephone Encounter (Signed)
Pt was sent to Short stay at [facility] after his apt this morning. Kevin [name] is aware and asked if pt could come over after his appointment.

## 2018-03-31 NOTE — Addendum Note (Signed)
Addended by: Gordy Levan, Kymberlyn Eckford A on: 03/31/2018 10:08 AM   Modules accepted: Orders

## 2018-03-31 NOTE — Progress Notes (Signed)
cc'ed to pcp °

## 2018-03-31 NOTE — Patient Instructions (Signed)
1. [facility] work. 2. They will tell you when to come back to receive 2 units of blood. 3. We will recheck your blood counts in 2 weeks. 4. If these are good, we can recheck a month later. 5. Call us if you have any further bleeding. 6. Follow-up in 2 months so we can check on your symptoms, blood levels, and schedule your next colonoscopy. 7. Call us if you have any questions or concerns   At [facility] we value your feedback. You may receive a survey about your visit today. Please share your experience as we strive to create trusting relationships with our patients to provide genuine, compassionate, quality care.  It was great to meet you today!  I hope you have a wonderful summer!!

## 2018-03-31 NOTE — Assessment & Plan Note (Signed)
The patient was admitted for rectal bleeding and noted to have acute blood loss anemia with a hemoglobin on discharge 8.1.  Follow-up check of his CBC found hemoglobin of 6.7.  Today he does note that he had 3-4 additional rectal bleeding episodes after discharge.  We discussed the need for blood transfusion to get him back up into the 8-9 range for safe question in case he has any further bleeding.  Paperwork has already been completed and sent to short stay.  They will check his type and cross today and plan for transfusion based on her schedule.  We will recheck his CBC in 2 weeks.  Follow-up in 2 months.  He is to call us for any further rectal bleeding.

## 2018-04-01 ENCOUNTER — Encounter (HOSPITAL_COMMUNITY)
Admission: RE | Admit: 2018-04-01 | Discharge: 2018-04-01 | Disposition: A | Discharge status: Home / Self Care | Payer: Medicare Other | Source: Ambulatory Visit | Attending: Gastroenterology | Admitting: Gastroenterology

## 2018-04-01 DIAGNOSIS — D62 Acute posthemorrhagic anemia: Secondary | ICD-10-CM | POA: Diagnosis not present

## 2018-04-01 MED ORDER — ACETAMINOPHEN 325 MG PO TABS
ORAL_TABLET | ORAL | Status: AC
  Filled 2018-04-01: qty 2

## 2018-04-01 MED ORDER — SODIUM CHLORIDE 0.9 % IV SOLN
Freq: Once | INTRAVENOUS | Status: AC
  Administered 2018-04-01: 250 mL via INTRAVENOUS

## 2018-04-01 MED ORDER — ACETAMINOPHEN 325 MG PO TABS
650.0000 mg | ORAL_TABLET | Freq: Once | ORAL | Status: AC
  Administered 2018-04-01: 650 mg via ORAL

## 2018-04-01 MED ORDER — DIPHENHYDRAMINE HCL 25 MG PO CAPS
25.0000 mg | ORAL_CAPSULE | Freq: Once | ORAL | Status: AC
  Administered 2018-04-01: 25 mg via ORAL

## 2018-04-01 MED ORDER — DIPHENHYDRAMINE HCL 25 MG PO CAPS
ORAL_CAPSULE | ORAL | Status: AC
  Filled 2018-04-01: qty 1

## 2018-04-02 LAB — TYPE AND SCREEN
ABO/RH(D): A POS
Antibody Screen: NEGATIVE
UNIT DIVISION: 0
Unit division: 0

## 2018-04-02 LAB — BPAM RBC
BLOOD PRODUCT EXPIRATION DATE: 201905282359
Blood Product Expiration Date: 201906012359
ISSUE DATE / TIME: 201905030749
ISSUE DATE / TIME: 201905031005
UNIT TYPE AND RH: 6200
Unit Type and Rh: 6200

## 2018-04-18 ENCOUNTER — Ambulatory Visit: Payer: Medicare Other | Admitting: Gastroenterology

## 2018-04-30 ENCOUNTER — Other Ambulatory Visit (HOSPITAL_COMMUNITY): Payer: Self-pay | Admitting: Psychiatry

## 2018-05-02 ENCOUNTER — Ambulatory Visit (HOSPITAL_COMMUNITY): Payer: Self-pay | Admitting: Psychiatry

## 2018-05-19 ENCOUNTER — Other Ambulatory Visit (HOSPITAL_COMMUNITY): Payer: Self-pay | Admitting: Psychiatry

## 2018-05-26 ENCOUNTER — Other Ambulatory Visit: Payer: Self-pay

## 2018-05-26 DIAGNOSIS — R7309 Other abnormal glucose: Secondary | ICD-10-CM | POA: Diagnosis not present

## 2018-05-26 DIAGNOSIS — E782 Mixed hyperlipidemia: Secondary | ICD-10-CM | POA: Diagnosis not present

## 2018-05-26 DIAGNOSIS — K5700 Diverticulitis of small intestine with perforation and abscess without bleeding: Secondary | ICD-10-CM | POA: Diagnosis not present

## 2018-05-26 DIAGNOSIS — M1000 Idiopathic gout, unspecified site: Secondary | ICD-10-CM | POA: Diagnosis not present

## 2018-05-26 DIAGNOSIS — E039 Hypothyroidism, unspecified: Secondary | ICD-10-CM | POA: Diagnosis not present

## 2018-05-26 DIAGNOSIS — I10 Essential (primary) hypertension: Secondary | ICD-10-CM | POA: Diagnosis not present

## 2018-05-26 NOTE — Patient Outreach (Signed)
[facility]) Care Management  05/26/2018  Kevin [name] [birth year] [id number]   Medication Adherence call to Mr. Kevin [name] left a message for patient to call back patient is due on Olmesartan/Amlodipine/Hctz and Metformin 1000 mg [facility] said they have it ready just a 30 days supply.Kevin [name] is showing past due under [place].   [place] Management Direct Dial [phone]  Fax [phone] [name]

## 2018-06-23 ENCOUNTER — Telehealth: Payer: Self-pay | Admitting: Internal Medicine

## 2018-06-23 ENCOUNTER — Encounter: Payer: Self-pay | Admitting: Nurse Practitioner

## 2018-06-23 ENCOUNTER — Ambulatory Visit: Payer: Medicare Other | Admitting: Nurse Practitioner

## 2018-06-23 NOTE — Progress Notes (Deleted)
Referring Provider: Lucianne Lei, MD Primary Care Physician:  Lucianne Lei, MD Primary GI:  Dr. Gala Romney  No chief complaint on file.   HPI:   Kevin [name] is a 72 y.o. male who presents for anemia, rectal bleeding, and to schedule colonoscopy.  Patient was last seen in our office 03/31/2018 for rectal bleeding, diverticulosis, acute blood loss anemia.  Patient had a hospitalization from 03/12/2018 through 03/14/2018.  Noted maroon stool x3 episodes, on NSAIDs.  CT of the abdomen found diverticula.  EGD was performed 03/12/2018 which showed erosive esophagitis, erosive gastropathy, nonobstructing Schatzki's ring.  Source of GI bleed felt to be diverticular.  Was discharged with a hemoglobin of 8.1.  Post auscultation labs 2 weeks after discharge found a significant decline in hemoglobin to 6.5.  Recommended stat CBC repeat 03/28/2018 which was completed and found to slight increase to 6.7.  Patient noted no overt GI bleeding although he was somewhat symptomatic.  Recommended ED evaluation and transfusion but the patient declined.  Subsequently recommended outpatient transfusion.  His last visit he noted more bleeding after discharge for about 3-4 episodes and since then has not had further bleeding.  Some weakness and fatigue, although this was noted to be improved at his last visit.  No other GI symptoms.  Last colonoscopy 5 years ago he feels he is about due for colonoscopy due to father with colon cancer in his 25s.  Recommended blood work and transfusion based on recommendations.  Recheck CBC in 2 weeks and then again in 1 month.  Call if any further bleeding, follow-up in 2 months to check symptoms and schedule colonoscopy.  It appears he received a transfusion on 04/04/2018.  He did not have further labs completed.  Today he states   Past Medical History:  Diagnosis Date  . Alcohol abuse   . Arthritis   . Diabetes mellitus without complication ([place])   . Diabetes mellitus, type II ([place])   .  GERD (gastroesophageal reflux disease)   . Gout   . Hypertension   . Hyperthyroidism     Past Surgical History:  Procedure Laterality Date  . BIOPSY  03/12/2018   Procedure: BIOPSY;  Surgeon: Daneil Dolin, MD;  Location: [facility];  Service: Endoscopy;;  gastric  . ESOPHAGOGASTRODUODENOSCOPY N/A 03/12/2018   Procedure: ESOPHAGOGASTRODUODENOSCOPY (EGD);  Surgeon: Daneil Dolin, MD;  Location: [facility];  Service: Endoscopy;  Laterality: N/A;  . NO PAST SURGERIES      Current Outpatient Medications  Medication Sig Dispense Refill  . alum & mag hydroxide-simeth (MAALOX/MYLANTA) 200-200-20 MG/5ML suspension Take 30 mLs by mouth every 6 (six) hours as needed for indigestion or heartburn.    . escitalopram (LEXAPRO) 20 MG tablet Take 1 tablet (20 mg total) by mouth 2 (two) times daily. 180 tablet 2  . gabapentin (NEURONTIN) 100 MG capsule TAKE 2 CAPSULES BY MOUTH 3 TIMES DAILY. FOR SUBSTANCE WITHDRAWAL SYNDROME, ANXIETY/PAIN 180 capsule 2  . levothyroxine (SYNTHROID, LEVOTHROID) 100 MCG tablet Take 1 tablet (100 mcg total) by mouth daily. For thyroid hormone replacement    . metFORMIN (GLUCOPHAGE) 1000 MG tablet Take 1,000 mg by mouth at bedtime.  3  . Olmesartan-Amlodipine-HCTZ (TRIBENZOR) 40-10-12.5 MG TABS Take 1 tablet by mouth daily. For hypertension 30 tablet    No current facility-administered medications for this visit.     Allergies as of 06/23/2018 - Review Complete 04/01/2018  Allergen Reaction Noted  . Trazodone and nefazodone  01/31/2014    Family  History  Problem Relation Age of Onset  . Colon cancer Father 55  . Alcohol abuse Paternal Aunt   . Drug abuse Paternal Uncle   . Alcohol abuse Paternal Uncle     Social History   Socioeconomic History  . Marital status: Single    Spouse name: Not on file  . Number of children: Not on file  . Years of education: Not on file  . Highest education level: Not on file  Occupational History  . Not on file    Social Needs  . Financial resource strain: Not on file  . Food insecurity:    Worry: Not on file    Inability: Not on file  . Transportation needs:    Medical: Not on file    Non-medical: Not on file  Tobacco Use  . Smoking status: Never Smoker  . Smokeless tobacco: Never Used  Substance and Sexual Activity  . Alcohol use: Not Currently    Comment: Last use 02/28/18; previously drank beer 'every now and then' Drank more heavily remotely.  . Drug use: No    Types: Benzodiazepines, Hydrocodone, Oxycodone  . Sexual activity: Yes  Lifestyle  . Physical activity:    Days per week: Not on file    Minutes per session: Not on file  . Stress: Not on file  Relationships  . Social connections:    Talks on phone: Not on file    Gets together: Not on file    Attends religious service: Not on file    Active member of club or organization: Not on file    Attends meetings of clubs or organizations: Not on file    Relationship status: Not on file  Other Topics Concern  . Not on file  Social History Narrative  . Not on file    Review of Systems: General: Negative for anorexia, weight loss, fever, chills, fatigue, weakness. Eyes: Negative for vision changes.  ENT: Negative for hoarseness, difficulty swallowing , nasal congestion. CV: Negative for chest pain, angina, palpitations, dyspnea on exertion, peripheral edema.  Respiratory: Negative for dyspnea at rest, dyspnea on exertion, cough, sputum, wheezing.  GI: See history of present illness. GU:  Negative for dysuria, hematuria, urinary incontinence, urinary frequency, nocturnal urination.  MS: Negative for joint pain, low back pain.  Derm: Negative for rash or itching.  Neuro: Negative for weakness, abnormal sensation, seizure, frequent headaches, memory loss, confusion.  Psych: Negative for anxiety, depression, suicidal ideation, hallucinations.  Endo: Negative for unusual weight change.  Heme: Negative for bruising or  bleeding. Allergy: Negative for rash or hives.   Physical Exam: There were no vitals taken for this visit. General:   Alert and oriented. Pleasant and cooperative. Well-nourished and well-developed.  Head:  Normocephalic and atraumatic. Eyes:  Without icterus, sclera clear and conjunctiva pink.  Ears:  Normal auditory acuity. Mouth:  No deformity or lesions, oral mucosa pink.  Throat/Neck:  Supple, without mass or thyromegaly. Cardiovascular:  S1, S2 present without murmurs appreciated. Normal pulses noted. Extremities without clubbing or edema. Respiratory:  Clear to auscultation bilaterally. No wheezes, rales, or rhonchi. No distress.  Gastrointestinal:  +BS, soft, non-tender and non-distended. No HSM noted. No guarding or rebound. No masses appreciated.  Rectal:  Deferred  Musculoskalatal:  Symmetrical without gross deformities. Normal posture. Skin:  Intact without significant lesions or rashes. Neurologic:  Alert and oriented x4;  grossly normal neurologically. Psych:  Alert and cooperative. Normal mood and affect. Heme/Lymph/Immune: No significant cervical adenopathy. No excessive  bruising noted.    06/23/2018 10:07 AM   Disclaimer: This note was dictated with voice recognition software. Similar sounding words can inadvertently be transcribed and may not be corrected upon review.

## 2018-06-23 NOTE — Telephone Encounter (Signed)
Patient was a no show and letter sent  °

## 2018-07-25 ENCOUNTER — Encounter (HOSPITAL_COMMUNITY): Payer: Self-pay | Admitting: Psychiatry

## 2018-07-25 ENCOUNTER — Ambulatory Visit (INDEPENDENT_AMBULATORY_CARE_PROVIDER_SITE_OTHER): Payer: Medicare Other | Admitting: Psychiatry

## 2018-07-25 VITALS — BP 124/78 | HR 65 | Ht 72.0 in | Wt 223.0 lb

## 2018-07-25 DIAGNOSIS — F1994 Other psychoactive substance use, unspecified with psychoactive substance-induced mood disorder: Secondary | ICD-10-CM

## 2018-07-25 DIAGNOSIS — Z79899 Other long term (current) drug therapy: Secondary | ICD-10-CM | POA: Diagnosis not present

## 2018-07-25 MED ORDER — ESCITALOPRAM OXALATE 20 MG PO TABS: 20.0000 mg | ORAL_TABLET | Freq: Two times a day (BID) | ORAL | 2 refills | Status: DC

## 2018-07-25 MED ORDER — GABAPENTIN 100 MG PO CAPS: ORAL_CAPSULE | ORAL | 2 refills | Status: DC

## 2018-07-25 NOTE — Progress Notes (Signed)
BH MD/PA/NP OP Progress Note  07/25/2018 9:52 AM Kevin [name]  MRN:  [id number]  Chief Complaint:  Chief Complaint    Depression; Anxiety; Follow-up     HPI:  this patient is a 72 year old single black male who lives alone in [place]. He has no children. 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 He also has a history of depression. This is his third hospitalization, having been previously hospitalized in 2012 and 2008 for the same issues. He's never really attended much counseling or AA even all this was recommended in the past.  The patient states that his depression began when he was much younger although he doesn't have any particular reason to feel depressed. He never sought out any treatment while he worked. He retired about 10 years ago and then began to drink more heavily to deal with his low mood and anxiety. Over the last few weeks before admission he was drinking about a fifth of liquor a day. He was not eating and was very depressed. He admits that he use some hydrocodone because of the pain in his hand due to arthritis but these were not prescribed to him. His drug screen was also positive for benzodiazepines but he claims he did not use these.  While in the hospital the patient went through a detox protocol. He was started on Lexapro and Neurontin. He is also on hydroxyzine but stopped it because it didn't help. He also stopped trazodone because it made him unable to walk. He states that he would like to switch his antidepressant to Lexapro because one of his friends has had a great result with it and I told him we could give it a try. He has not drank since he got out of the hospital or use any drugs.  He is spending time with his family. 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 He's never been suicidal or had psychotic symptoms. He is sleeping well.  The patient returns after 8 months.  He has missed some appointments.  He states for the most part he is doing okay.  He was hospitalized in April due to rectal bleeding which was thought to be from diverticulitis.  He admits that he is drinking about a sixpack a week now.  I warned him to try to stop this because he did have a problem with alcoholism in the past and had to go through rehab.  He states he is met a girl who drinks quite a bit.  Nevertheless I think it would be prudent for him to stop drinking.  He agrees.  He states that his mood is good and that his business is busy and he is sleeping well.  He denies any thoughts of depression anxiety or suicide Visit Diagnosis:    ICD-10-CM   1. Substance induced mood disorder ([place]) F19.94     Past Psychiatric History: Past admission for detox  Past Medical History:  Past Medical History:  Diagnosis Date  . Alcohol abuse   . Arthritis   . Diabetes mellitus without complication ([place])   . Diabetes mellitus, type II ([place])   . GERD (gastroesophageal reflux disease)   .  Gout   . Hypertension   . Hyperthyroidism     Past Surgical History:  Procedure Laterality Date  . BIOPSY  03/12/2018   Procedure: BIOPSY;  Surgeon: Daneil Dolin, MD;  [facility];  Service: Endoscopy;;  gastric  . ESOPHAGOGASTRODUODENOSCOPY N/A 03/12/2018   Procedure: ESOPHAGOGASTRODUODENOSCOPY (EGD);  Surgeon: Daneil Dolin, MD;  [facility];  Service: Endoscopy;  Laterality: N/A;  . NO PAST SURGERIES      Family Psychiatric History: See below  Family History:  Family History  Problem Relation Age of Onset  . Colon cancer Father 66  . Alcohol abuse Paternal Aunt   . Drug abuse Paternal Uncle   . Alcohol abuse Paternal Uncle     Social History:  Social  History   Socioeconomic History  . Marital status: Single    Spouse name: Not on file  . Number of children: Not on file  . Years of education: Not on file  . Highest education level: Not on file  Occupational History  . Not on file  Social Needs  . Financial resource strain: Not on file  . Food insecurity:    Worry: Not on file    Inability: Not on file  . Transportation needs:    Medical: Not on file    Non-medical: Not on file  Tobacco Use  . Smoking status: Never Smoker  . Smokeless tobacco: Never Used  Substance and Sexual Activity  . Alcohol use: Not Currently    Comment: Last use 02/28/18; previously drank beer 'every now and then' Drank more heavily remotely.  . Drug use: No    Types: Benzodiazepines, Hydrocodone, Oxycodone  . Sexual activity: Yes  Lifestyle  . Physical activity:    Days per week: Not on file    Minutes per session: Not on file  . Stress: Not on file  Relationships  . Social connections:    Talks on phone: Not on file    Gets together: Not on file    Attends religious service: Not on file    Active member of club or organization: Not on file    Attends meetings of clubs or organizations: Not on file    Relationship status: Not on file  Other Topics Concern  . Not on file  Social History Narrative  . Not on file    Allergies:  Allergies  Allergen Reactions  . Trazodone And Nefazodone     Unable to walk    Metabolic Disorder Labs: Lab Results  Component Value Date   HGBA1C 5.9 (H) 03/12/2018   MPG 122.63 03/12/2018   MPG 117 (H) 01/04/2014   No results found for: PROLACTIN No results found for: CHOL, TRIG, HDL, CHOLHDL, VLDL, LDLCALC   Therapeutic Level Labs: No results found for: LITHIUM No results found for: VALPROATE No components found for:  CBMZ  Current Medications: Current Outpatient Medications  Medication Sig Dispense Refill  . alum & mag hydroxide-simeth (MAALOX/MYLANTA) 200-200-20 MG/5ML suspension Take 30 mLs by  mouth every 6 (six) hours as needed for indigestion or heartburn.    . escitalopram (LEXAPRO) 20 MG tablet Take 1 tablet (20 mg total) by mouth 2 (two) times daily. 180 tablet 2  . gabapentin (NEURONTIN) 100 MG capsule Take two tablets 3 times a day 180 capsule 2  . levothyroxine (SYNTHROID, LEVOTHROID) 100 MCG tablet Take 1 tablet (100 mcg total) by mouth daily. For thyroid hormone replacement    . metFORMIN (GLUCOPHAGE) 1000 MG tablet  Take 1,000 mg by mouth at bedtime.  3  . Olmesartan-Amlodipine-HCTZ (TRIBENZOR) 40-10-12.5 MG TABS Take 1 tablet by mouth daily. For hypertension 30 tablet    No current facility-administered medications for this visit.      Musculoskeletal: Strength & Muscle Tone: within normal limits Gait & Station: normal Patient leans: N/A  Psychiatric Specialty Exam: Review of Systems  All other systems reviewed and are negative.   Blood pressure 124/78, pulse 65, height 6' (1.829 m), weight 223 lb (101.2 kg), SpO2 98 %.Body mass index is 30.24 kg/m.  General Appearance: Casual, Neat and Well Groomed  Eye Contact:  Good  Speech:  Clear and Coherent  Volume:  Normal  Mood:  Euthymic  Affect:  Congruent  Thought Process:  Goal Directed  Orientation:  Full (Time, Place, and Person)  Thought Content: WDL   Suicidal Thoughts:  No  Homicidal Thoughts:  No  Memory:  Immediate;   Good Recent;   Good Remote;   Fair  Judgement:  Fair  Insight:  Lacking  Psychomotor Activity:  Normal  Concentration:  Concentration: Good and Attention Span: Good  Recall:  Good  Fund of Knowledge: Fair  Language: Good  Akathisia:  No  Handed:  Right  AIMS (if indicated): not done  Assets:  Communication Skills Desire for Improvement Resilience Social Support Talents/Skills  ADL's:  Intact  Cognition: WNL  Sleep:  Good   Screenings: AUDIT     Admission (Discharged) from 01/03/2014 in [facility] [place] [facility] Admission (Discharged) from 01/24/2013 in  [place] [facility]  Alcohol Use Disorder Identification Test Final Score (AUDIT)  34  33       Assessment and Plan: This patient is a 72 year old male with a history of depression related to alcohol abuse.  He is drinking a little bit now and I warned him to stop before it goes much further.  He agrees to try.  He is doing well on his current regimen we will continue Lexapro 20 mg twice daily for depression and gabapentin 200 mg 3 times daily for mood stabilization and anxiety.  He will return to see me in 6 months   Levonne Spiller, MD 07/25/2018, 9:52 AM

## 2018-09-15 DIAGNOSIS — E1100 Type 2 diabetes mellitus with hyperosmolarity without nonketotic hyperglycemic-hyperosmolar coma (NKHHC): Secondary | ICD-10-CM | POA: Diagnosis not present

## 2018-09-15 DIAGNOSIS — E039 Hypothyroidism, unspecified: Secondary | ICD-10-CM | POA: Diagnosis not present

## 2018-09-15 DIAGNOSIS — E7800 Pure hypercholesterolemia, unspecified: Secondary | ICD-10-CM | POA: Diagnosis not present

## 2018-09-15 DIAGNOSIS — I10 Essential (primary) hypertension: Secondary | ICD-10-CM | POA: Diagnosis not present

## 2018-09-15 DIAGNOSIS — E785 Hyperlipidemia, unspecified: Secondary | ICD-10-CM | POA: Diagnosis not present

## 2018-09-15 DIAGNOSIS — Z0000 Encounter for general adult medical examination without abnormal findings: Secondary | ICD-10-CM | POA: Diagnosis not present

## 2018-09-26 ENCOUNTER — Other Ambulatory Visit: Payer: Self-pay

## 2018-09-26 NOTE — Patient Outreach (Signed)
[facility]) Care Management  09/26/2018  Kevin [name] 13-Jun-1946 [id number]   Medication Adherence call to Kevin [name] spoke with patient he said he already order both medication [facility] said [facility] for [facility] to approve refills on medication patient is due on Metformin 1000 mg and Olmesartan/Amlodepine/Hctz 40/10/12.5 mg. Kevin [name] is showing past due under [name].   [place] Management Direct Dial [phone]  Fax [phone] [email or web address]

## 2018-10-19 ENCOUNTER — Other Ambulatory Visit (HOSPITAL_COMMUNITY): Payer: Self-pay | Admitting: Psychiatry

## 2019-01-12 ENCOUNTER — Other Ambulatory Visit (HOSPITAL_COMMUNITY): Payer: Self-pay | Admitting: Psychiatry

## 2019-01-17 DIAGNOSIS — M1000 Idiopathic gout, unspecified site: Secondary | ICD-10-CM | POA: Diagnosis not present

## 2019-01-17 DIAGNOSIS — E0800 Diabetes mellitus due to underlying condition with hyperosmolarity without nonketotic hyperglycemic-hyperosmolar coma (NKHHC): Secondary | ICD-10-CM | POA: Diagnosis not present

## 2019-01-17 DIAGNOSIS — I10 Essential (primary) hypertension: Secondary | ICD-10-CM | POA: Diagnosis not present

## 2019-01-17 DIAGNOSIS — E039 Hypothyroidism, unspecified: Secondary | ICD-10-CM | POA: Diagnosis not present

## 2019-01-17 DIAGNOSIS — H44519 Absolute glaucoma, unspecified eye: Secondary | ICD-10-CM | POA: Diagnosis not present

## 2019-01-17 DIAGNOSIS — E785 Hyperlipidemia, unspecified: Secondary | ICD-10-CM | POA: Diagnosis not present

## 2019-01-25 ENCOUNTER — Ambulatory Visit (HOSPITAL_COMMUNITY): Payer: Self-pay | Admitting: Psychiatry

## 2019-02-02 ENCOUNTER — Ambulatory Visit (HOSPITAL_COMMUNITY): Payer: Medicare Other | Admitting: Psychiatry

## 2019-02-27 ENCOUNTER — Other Ambulatory Visit: Payer: Self-pay

## 2019-02-27 NOTE — Patient Outreach (Signed)
[facility]) Care Management  02/27/2019  Kevin [name] [birth year] [id number]   Medication Adherence call to Kevin [name] patient did not answer patient is due on Olmesartan/Amlodepine/Hctz 40/10/12.5 mg under [place].  Kevin [name]  Fax [phone] [name]

## 2019-04-10 ENCOUNTER — Other Ambulatory Visit (HOSPITAL_COMMUNITY): Payer: Self-pay | Admitting: Psychiatry

## 2019-04-21 ENCOUNTER — Other Ambulatory Visit (HOSPITAL_COMMUNITY): Payer: Self-pay | Admitting: Psychiatry

## 2019-04-21 DIAGNOSIS — E785 Hyperlipidemia, unspecified: Secondary | ICD-10-CM | POA: Diagnosis not present

## 2019-04-21 DIAGNOSIS — I10 Essential (primary) hypertension: Secondary | ICD-10-CM | POA: Diagnosis not present

## 2019-04-21 DIAGNOSIS — E039 Hypothyroidism, unspecified: Secondary | ICD-10-CM | POA: Diagnosis not present

## 2019-04-21 DIAGNOSIS — E1169 Type 2 diabetes mellitus with other specified complication: Secondary | ICD-10-CM | POA: Diagnosis not present

## 2019-04-21 DIAGNOSIS — M1000 Idiopathic gout, unspecified site: Secondary | ICD-10-CM | POA: Diagnosis not present

## 2019-04-21 DIAGNOSIS — E782 Mixed hyperlipidemia: Secondary | ICD-10-CM | POA: Diagnosis not present

## 2019-04-26 ENCOUNTER — Other Ambulatory Visit (HOSPITAL_COMMUNITY): Payer: Self-pay | Admitting: Psychiatry

## 2019-05-02 ENCOUNTER — Other Ambulatory Visit (HOSPITAL_COMMUNITY): Payer: Self-pay | Admitting: Psychiatry

## 2019-05-12 ENCOUNTER — Ambulatory Visit (HOSPITAL_COMMUNITY): Payer: Medicare Other | Admitting: Psychiatry

## 2019-05-12 ENCOUNTER — Telehealth (HOSPITAL_COMMUNITY): Payer: Self-pay | Admitting: Psychiatry

## 2019-05-12 ENCOUNTER — Other Ambulatory Visit: Payer: Self-pay

## 2019-05-24 ENCOUNTER — Other Ambulatory Visit (HOSPITAL_COMMUNITY): Payer: Self-pay | Admitting: Psychiatry

## 2019-05-24 ENCOUNTER — Telehealth (HOSPITAL_COMMUNITY): Payer: Self-pay | Admitting: *Deleted

## 2019-05-24 NOTE — Telephone Encounter (Signed)
Dr Harrington Challenger  Patient next appt is  7/1 he asked for a refill on his LEXAPRO & Gabapentin until he has he phone   Visit next week stated he's been out a few weeks now

## 2019-05-24 NOTE — Telephone Encounter (Signed)
LVM PER PROVIDER : He has missed several appts. He will get refills if and when he presents for his appt

## 2019-05-24 NOTE — Telephone Encounter (Signed)
He has missed several appts. He will get refills if and when he presents for his appt

## 2019-05-31 ENCOUNTER — Other Ambulatory Visit: Payer: Self-pay

## 2019-05-31 ENCOUNTER — Encounter (HOSPITAL_COMMUNITY): Payer: Self-pay | Admitting: Psychiatry

## 2019-05-31 ENCOUNTER — Ambulatory Visit (INDEPENDENT_AMBULATORY_CARE_PROVIDER_SITE_OTHER): Payer: Medicare Other | Admitting: Psychiatry

## 2019-05-31 DIAGNOSIS — F1994 Other psychoactive substance use, unspecified with psychoactive substance-induced mood disorder: Secondary | ICD-10-CM | POA: Diagnosis not present

## 2019-05-31 MED ORDER — GABAPENTIN 100 MG PO CAPS: ORAL_CAPSULE | ORAL | 5 refills | Status: DC

## 2019-05-31 MED ORDER — ESCITALOPRAM OXALATE 20 MG PO TABS: 20.0000 mg | ORAL_TABLET | Freq: Two times a day (BID) | ORAL | 2 refills | Status: DC

## 2019-05-31 NOTE — Progress Notes (Signed)
Virtual Visit via Telephone Note  I connected with Kevin [name] on 05/31/19 at 11:20 AM EDT by telephone and verified that I am speaking with the correct person using two identifiers.   I discussed the limitations, risks, security and privacy concerns of performing an evaluation and management service by telephone and the availability of in person appointments. I also discussed with the patient that there may be a patient responsible charge related to this service. The patient expressed understanding and agreed to proceed.     I discussed the assessment and treatment plan with the patient. The patient was provided an opportunity to ask questions and all were answered. The patient agreed with the plan and demonstrated an understanding of the instructions.   The patient was advised to call back or seek an in-person evaluation if the symptoms worsen or if the condition fails to improve as anticipated.  I provided 15 minutes of non-face-to-face time during this encounter.   Levonne Spiller, MD  [facility] MD/PA/NP OP Progress Note  05/31/2019 11:09 AM Kevin [name]  MRN:  [id number]  Chief Complaint:  Chief Complaint    Depression; Anxiety; Follow-up     HPI: this patient is a 73 year old single black male who lives alone in [place]. He has no children. 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 He also has a history of depression. This is his third hospitalization, having been previously hospitalized in 2012 and 2008 for the same issues. He's never really attended much counseling or AA even all this was recommended in the past.  The patient states that his depression began when he was much younger although he doesn't  have any particular reason to feel depressed. He never sought out any treatment while he worked. He retired about 10 years ago and then began to drink more heavily to deal with his low mood and anxiety. Over the last few weeks before admission he was drinking about a fifth of liquor a day. He was not eating and was very depressed. He admits that he use some hydrocodone because of the pain in his hand due to arthritis but these were not prescribed to him. His drug screen was also positive for benzodiazepines but he claims he did not use these.  While in the hospital the patient went through a detox protocol. He was started on Lexapro and Neurontin. He is also on hydroxyzine but stopped it because it didn't help. He also stopped trazodone because it made him unable to walk. He states that he would like to switch his antidepressant to Lexapro because one of his friends has had a great result with it and I told him we could give it a try. He has not drank since he got out of the hospital or use any drugs. He is spending time with his family. 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 He's never been suicidal or had psychotic symptoms. He is sleeping well.  The patient returns for follow-up after 8 months.  He has missed some appointments.  He states that he has run out of his medication recently and does not feel good without it.  He is getting a little bit more depressed  and is not sleeping well.  He denies suicidal ideation.  He is not using any drugs and states he drinks beer but only very occasionally and only a little bit.  His health is generally been good.  He would like to get back on the Lexapro.  He has still been taking gabapentin but using it sparingly because he was about to run out.  I reminded him that he needs to keep his appointments here in order to get refills. Visit Diagnosis:    ICD-10-CM   1. Substance induced mood disorder ([place])  F19.94     Past Psychiatric  History: Past admission for detox  Past Medical History:  Past Medical History:  Diagnosis Date  . Alcohol abuse   . Arthritis   . Diabetes mellitus without complication ([place])   . Diabetes mellitus, type II ([place])   . GERD (gastroesophageal reflux disease)   . Gout   . Hypertension   . Hyperthyroidism     Past Surgical History:  Procedure Laterality Date  . BIOPSY  03/12/2018   Procedure: BIOPSY;  Surgeon: Daneil Dolin, MD;  [facility];  Service: Endoscopy;;  gastric  . ESOPHAGOGASTRODUODENOSCOPY N/A 03/12/2018   Procedure: ESOPHAGOGASTRODUODENOSCOPY (EGD);  Surgeon: Daneil Dolin, MD;  [facility];  Service: Endoscopy;  Laterality: N/A;  . NO PAST SURGERIES      Family Psychiatric History: see below  Family History:  Family History  Problem Relation Age of Onset  . Colon cancer Father 69  . Alcohol abuse Paternal Aunt   . Drug abuse Paternal Uncle   . Alcohol abuse Paternal Uncle     Social History:  Social History   Socioeconomic History  . Marital status: Single    Spouse name: Not on file  . Number of children: Not on file  . Years of education: Not on file  . Highest education level: Not on file  Occupational History  . Not on file  Social Needs  . Financial resource strain: Not on file  . Food insecurity    Worry: Not on file    Inability: Not on file  . Transportation needs    Medical: Not on file    Non-medical: Not on file  Tobacco Use  . Smoking status: Never Smoker  . Smokeless tobacco: Never Used  Substance and Sexual Activity  . Alcohol use: Not Currently    Comment: Last use 02/28/18; previously drank beer 'every now and then' Drank more heavily remotely.  . Drug use: No    Types: Benzodiazepines, Hydrocodone, Oxycodone  . Sexual activity: Yes  Lifestyle  . Physical activity    Days per week: Not on file    Minutes per session: Not on file  . Stress: Not on file  Relationships  . Social Herbalist on  phone: Not on file    Gets together: Not on file    Attends religious service: Not on file    Active member of club or organization: Not on file    Attends meetings of clubs or organizations: Not on file    Relationship status: Not on file  Other Topics Concern  . Not on file  Social History Narrative  . Not on file    Allergies:  Allergies  Allergen Reactions  . Trazodone And Nefazodone     Unable to walk    Metabolic Disorder Labs: Lab Results  Component Value Date   HGBA1C 5.9 (H) 03/12/2018  MPG 122.63 03/12/2018   MPG 117 (H) 01/04/2014   No results found for: PROLACTIN No results found for: CHOL, TRIG, HDL, CHOLHDL, VLDL, LDLCALC   Therapeutic Level Labs: No results found for: LITHIUM No results found for: VALPROATE No components found for:  CBMZ  Current Medications: Current Outpatient Medications  Medication Sig Dispense Refill  . alum & mag hydroxide-simeth (MAALOX/MYLANTA) 200-200-20 MG/5ML suspension Take 30 mLs by mouth every 6 (six) hours as needed for indigestion or heartburn.    . escitalopram (LEXAPRO) 20 MG tablet Take 1 tablet (20 mg total) by mouth 2 (two) times daily. 180 tablet 2  . gabapentin (NEURONTIN) 100 MG capsule Take two tablets three times a day 180 capsule 5  . levothyroxine (SYNTHROID, LEVOTHROID) 100 MCG tablet Take 1 tablet (100 mcg total) by mouth daily. For thyroid hormone replacement    . metFORMIN (GLUCOPHAGE) 1000 MG tablet Take 1,000 mg by mouth at bedtime.  3  . Olmesartan-Amlodipine-HCTZ (TRIBENZOR) 40-10-12.5 MG TABS Take 1 tablet by mouth daily. For hypertension 30 tablet    No current facility-administered medications for this visit.      Musculoskeletal: Strength & Muscle Tone: within normal limits Gait & Station: normal Patient leans: N/A  Psychiatric Specialty Exam: Review of Systems  Psychiatric/Behavioral: Positive for depression.  All other systems reviewed and are negative.   There were no vitals taken for  this visit.There is no height or weight on file to calculate BMI.  General Appearance: NA  Eye Contact:  NA  Speech:  Clear and Coherent  Volume:  Normal  Mood:  Dysphoric  Affect:  NA  Thought Process:  Goal Directed  Orientation:  Full (Time, Place, and Person)  Thought Content: Rumination   Suicidal Thoughts:  No  Homicidal Thoughts:  No  Memory:  Recent;   Good Remote;   Fair  Judgement:  Fair  Insight:  Fair  Psychomotor Activity:  Decreased  Concentration:  Concentration: Good and Attention Span: Good  Recall:  Good  Fund of Knowledge: Fair  Language: Good  Akathisia:  No  Handed:  Right  AIMS (if indicated): not done  Assets:  Communication Skills Desire for Improvement Physical Health Resilience Social Support Talents/Skills  ADL's:  Intact  Cognition: WNL  Sleep:  Fair   Screenings: AUDIT     Admission (Discharged) from 01/03/2014 in [place] [facility] Admission (Discharged) from 01/24/2013 in [place] [facility]  Alcohol Use Disorder Identification Test Final Score (AUDIT)  34  33       Assessment and Plan: This patient is a 73 year old male with a history of depression related to alcohol abuse.  He states that he is getting a little bit more depressed since his medication ran out.  He will continue Lexapro 20 mg twice daily for depression and gabapentin 200 mg 3 times daily for mood stabilization and anxiety.  He will return to see me in 6 months.   Levonne Spiller, MD 05/31/2019, 11:09 AM

## 2019-10-20 DIAGNOSIS — I10 Essential (primary) hypertension: Secondary | ICD-10-CM | POA: Diagnosis not present

## 2019-10-20 DIAGNOSIS — E782 Mixed hyperlipidemia: Secondary | ICD-10-CM | POA: Diagnosis not present

## 2019-10-20 DIAGNOSIS — E039 Hypothyroidism, unspecified: Secondary | ICD-10-CM | POA: Diagnosis not present

## 2019-10-20 DIAGNOSIS — E1169 Type 2 diabetes mellitus with other specified complication: Secondary | ICD-10-CM | POA: Diagnosis not present

## 2019-10-20 DIAGNOSIS — E1111 Type 2 diabetes mellitus with ketoacidosis with coma: Secondary | ICD-10-CM | POA: Diagnosis not present

## 2019-12-07 DIAGNOSIS — H44519 Absolute glaucoma, unspecified eye: Secondary | ICD-10-CM | POA: Diagnosis not present

## 2019-12-07 DIAGNOSIS — E039 Hypothyroidism, unspecified: Secondary | ICD-10-CM | POA: Diagnosis not present

## 2019-12-07 DIAGNOSIS — I10 Essential (primary) hypertension: Secondary | ICD-10-CM | POA: Diagnosis not present

## 2019-12-07 DIAGNOSIS — R7309 Other abnormal glucose: Secondary | ICD-10-CM | POA: Diagnosis not present

## 2019-12-07 DIAGNOSIS — M130 Polyarthritis, unspecified: Secondary | ICD-10-CM | POA: Diagnosis not present

## 2019-12-07 DIAGNOSIS — E785 Hyperlipidemia, unspecified: Secondary | ICD-10-CM | POA: Diagnosis not present

## 2019-12-28 DIAGNOSIS — M130 Polyarthritis, unspecified: Secondary | ICD-10-CM | POA: Diagnosis not present

## 2019-12-28 DIAGNOSIS — E039 Hypothyroidism, unspecified: Secondary | ICD-10-CM | POA: Diagnosis not present

## 2019-12-28 DIAGNOSIS — I10 Essential (primary) hypertension: Secondary | ICD-10-CM | POA: Diagnosis not present

## 2019-12-28 DIAGNOSIS — E785 Hyperlipidemia, unspecified: Secondary | ICD-10-CM | POA: Diagnosis not present

## 2020-02-20 IMAGING — CT CT ABD-PELV W/ CM
2 of 5 series · 16 of 46 positions shown, 18 images · IV contrast (Isovue)
Comparison: None.

CLINICAL DATA: Epigastric abdominal pain. Rectal bleeding.
Abdominal distension.

EXAM:
CT ABDOMEN AND PELVIS WITH CONTRAST
TECHNIQUE: Multidetector CT imaging of the abdomen and pelvis was performed
using the standard protocol following bolus administration of
intravenous contrast.
CONTRAST:  80mL 2OTRJI-QAA IOPAMIDOL (2OTRJI-QAA) INJECTION 61%

[Series 2: axial st · axial · 0.87mm/px · z∈[+877,+1302]mm · 13 of 95 slices shown, 15 images]
[im 5/95  soft-tissue]
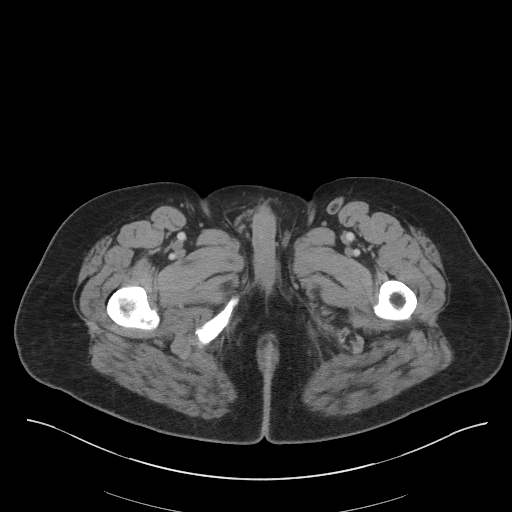
[im 5/95  bone]
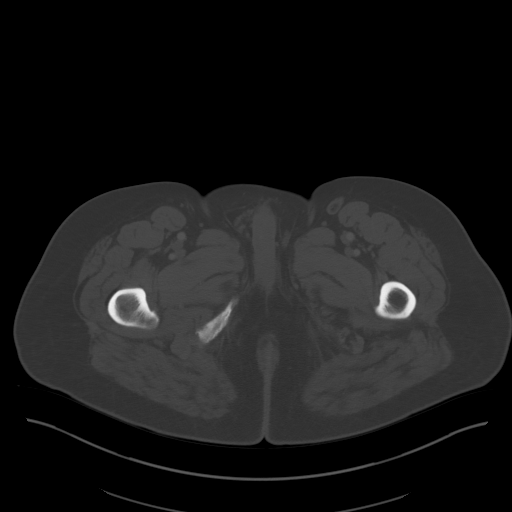
[im 15/95  soft-tissue]
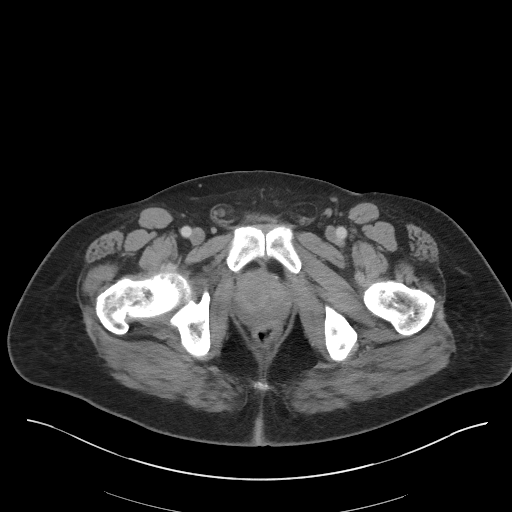
[im 20/95  soft-tissue]
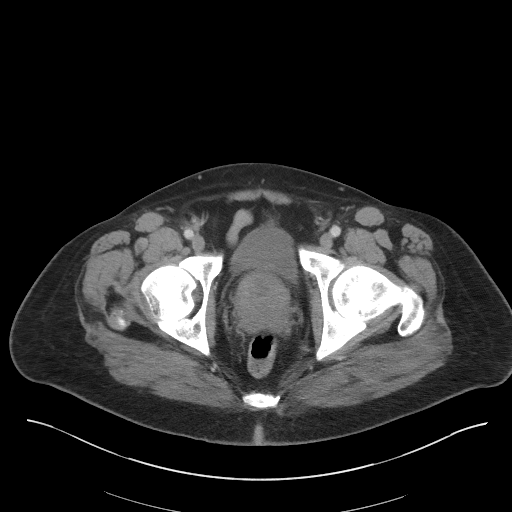
[im 25/95  soft-tissue]
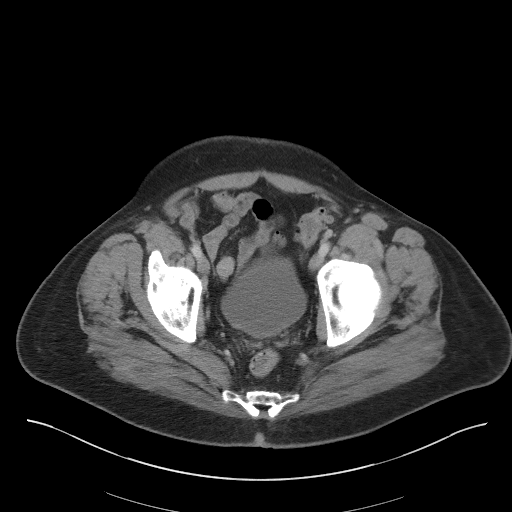
[im 35/95  soft-tissue]
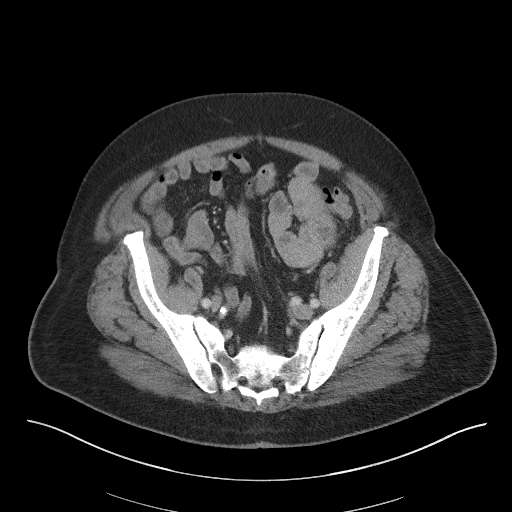
[im 40/95  soft-tissue]
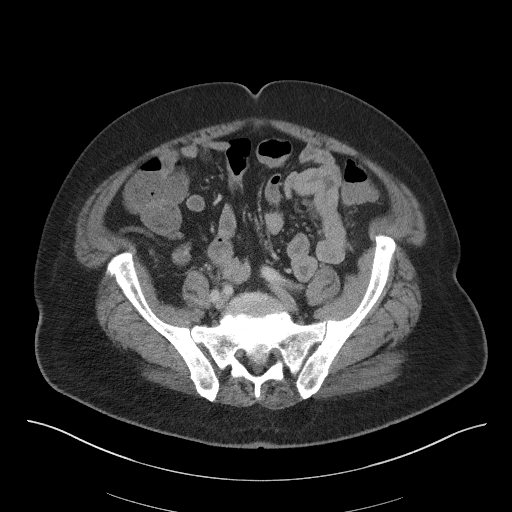
[im 50/95  soft-tissue]
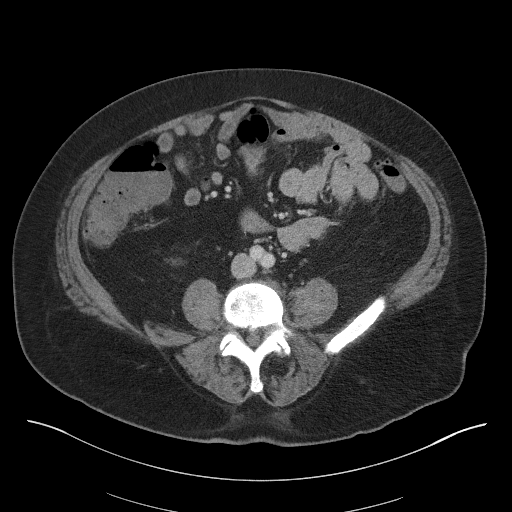
[im 55/95  soft-tissue]
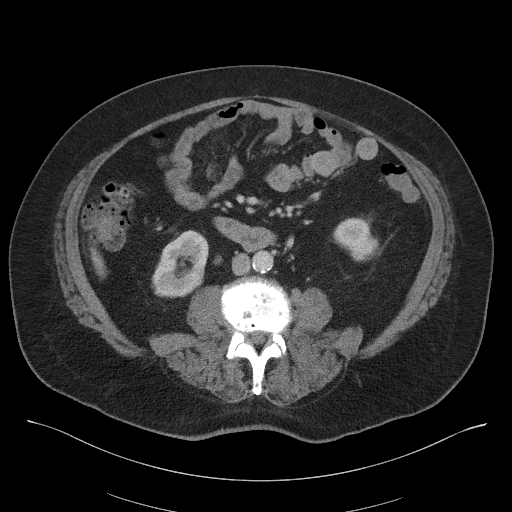
[im 60/95  soft-tissue]
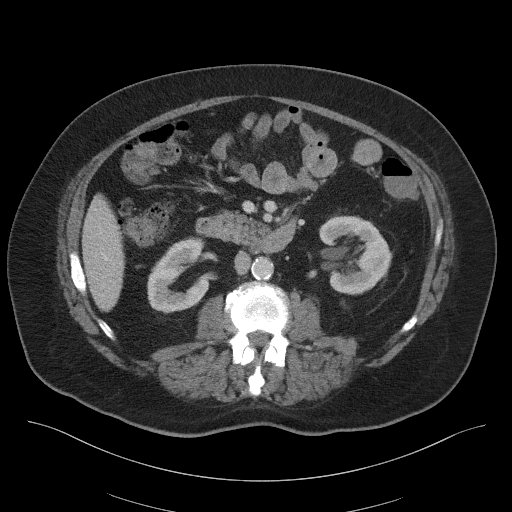
[im 60/95  bone]
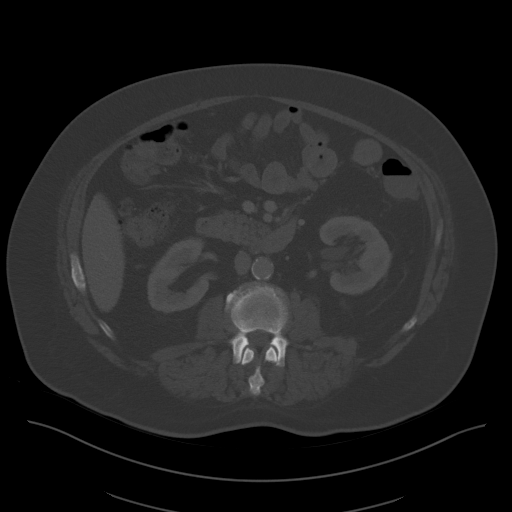
[im 70/95  soft-tissue]
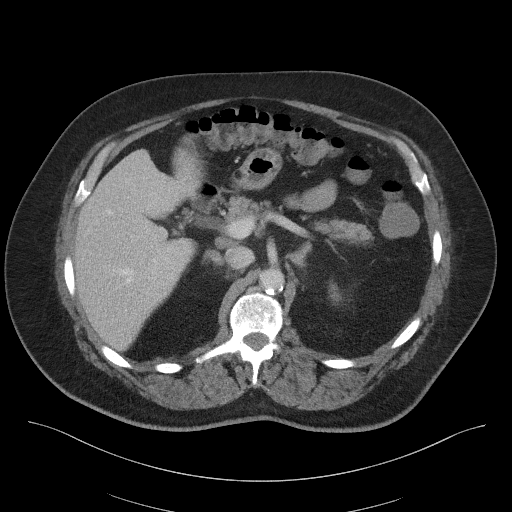
[im 75/95  soft-tissue]
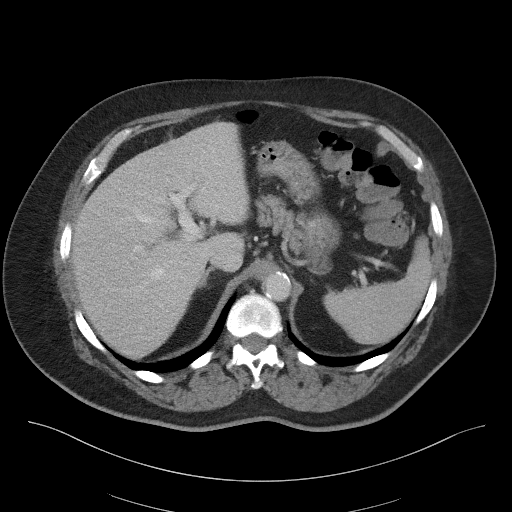
[im 80/95  soft-tissue]
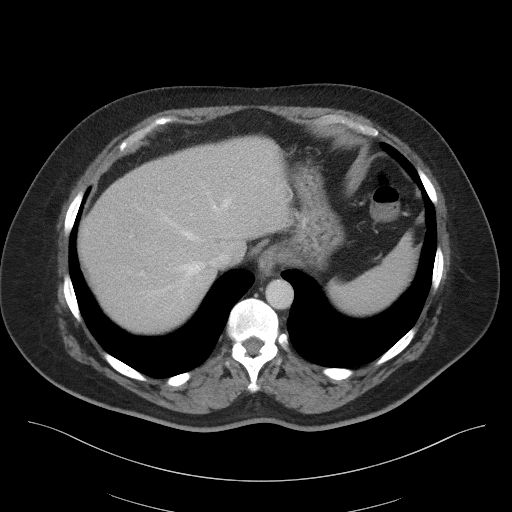
[im 90/95  soft-tissue]
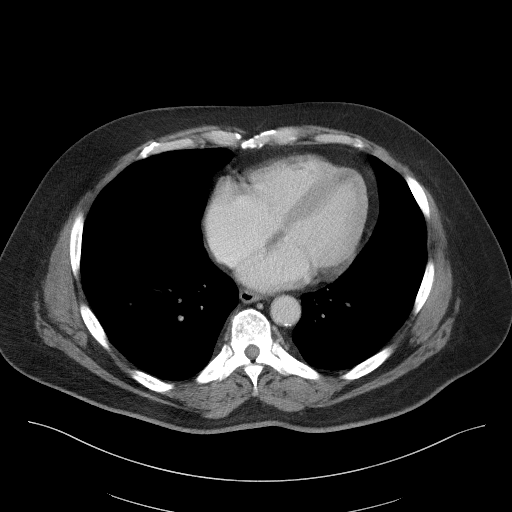

[Series 5: coronal st · coronal · 0.82mm/px · 3 of 117 slices shown]
[im 39/117  soft-tissue]
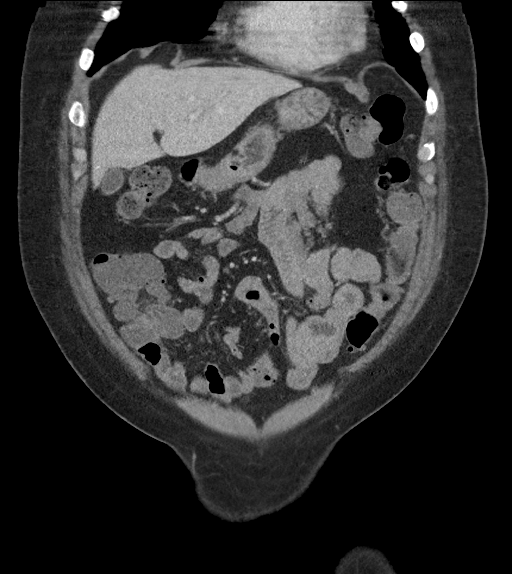
[im 52/117  soft-tissue]
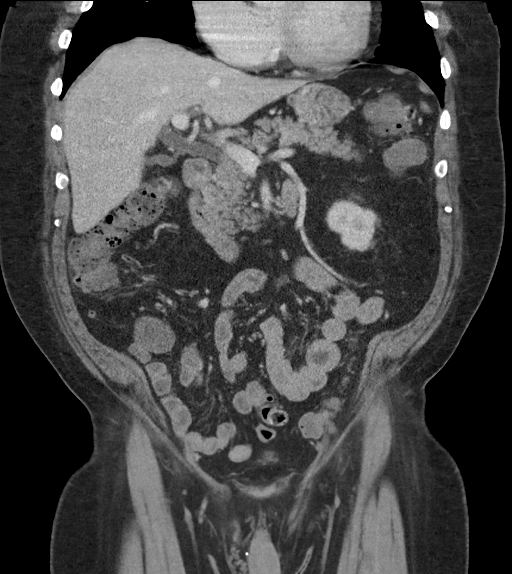
[im 65/117  soft-tissue]
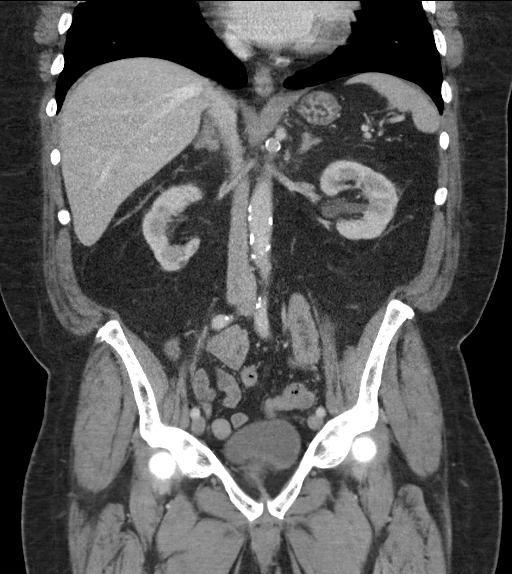

[16 of 46 positions shown; findings below may reference images not displayed]

FINDINGS: Lower chest: Patchy opacity in the right lower lobe is only
partially included in the field of view, image 1 series 4.

Hepatobiliary: Punctate hepatic granuloma. No focal hepatic lesion.
Calcified gallstones within partially distended gallbladder. No
pericholecystic inflammation. No biliary dilatation.

Pancreas: No ductal dilatation or inflammation.

Spleen: Normal in size without focal abnormality.

Adrenals/Urinary Tract: Bilateral adrenal thickening without
dominant nodule. No hydronephrosis. Mild symmetric perinephric edema
is likely chronic. Bilateral renal cysts including parapelvic cysts
in the left kidney. A 17 mm cyst in the upper left kidney is mildly
complex and contains a central calcification. Urinary bladder is
physiologically distended without wall thickening.

Stomach/Bowel: Stomach is nondistended. No small bowel inflammation,
obstruction or wall thickening. Normal appendix. Multifocal colonic
diverticulosis throughout the entire colon without acute
diverticulitis. No colonic wall thickening or inflammatory change.

Vascular/Lymphatic: Aorta bi-iliac atherosclerosis. No aneurysm. No
enlarged abdominal or pelvic lymph nodes.

Reproductive: Enlarged prostate gland spanning 5.6 cm transverse.

Other: No free air or ascites. No intra-abdominal abscess. There is
fat in the inguinal canals.

Musculoskeletal: There are no acute or suspicious osseous
abnormalities. Degenerative change in the lower lumbar spine with
degenerative disc disease and facet arthropathy.
IMPRESSION: 1. Multifocal colonic diverticulosis without acute diverticulitis.
No bowel inflammation or other explanation for GI bleed. No
obstruction.
2. Gallstones without gallbladder inflammation.
3. Minimal patchy opacity in the right lower lobe is only partially
included, may be infectious or inflammatory.
4. Incidental findings of enlarged prostate gland and Aortic
Atherosclerosis ([id number]).

## 2020-03-11 ENCOUNTER — Other Ambulatory Visit (HOSPITAL_COMMUNITY): Payer: Self-pay | Admitting: Psychiatry

## 2020-03-11 NOTE — Telephone Encounter (Signed)
ATTEMPTED CALL PER PROVIDER Call for appt. HAD TO LVM.   INFORMED LAST OFFICE VISIT  WAS 07/25/2018.

## 2020-03-11 NOTE — Telephone Encounter (Signed)
Call for appt

## 2020-03-29 DIAGNOSIS — E039 Hypothyroidism, unspecified: Secondary | ICD-10-CM | POA: Diagnosis not present

## 2020-03-29 DIAGNOSIS — E1100 Type 2 diabetes mellitus with hyperosmolarity without nonketotic hyperglycemic-hyperosmolar coma (NKHHC): Secondary | ICD-10-CM | POA: Diagnosis not present

## 2020-03-29 DIAGNOSIS — I10 Essential (primary) hypertension: Secondary | ICD-10-CM | POA: Diagnosis not present

## 2020-03-29 DIAGNOSIS — M130 Polyarthritis, unspecified: Secondary | ICD-10-CM | POA: Diagnosis not present

## 2020-04-11 DIAGNOSIS — I10 Essential (primary) hypertension: Secondary | ICD-10-CM | POA: Diagnosis not present

## 2020-04-11 DIAGNOSIS — E039 Hypothyroidism, unspecified: Secondary | ICD-10-CM | POA: Diagnosis not present

## 2020-04-11 DIAGNOSIS — M130 Polyarthritis, unspecified: Secondary | ICD-10-CM | POA: Diagnosis not present

## 2020-04-11 DIAGNOSIS — R7309 Other abnormal glucose: Secondary | ICD-10-CM | POA: Diagnosis not present

## 2020-04-26 ENCOUNTER — Encounter: Payer: Self-pay | Admitting: Pharmacist

## 2020-04-29 DIAGNOSIS — E039 Hypothyroidism, unspecified: Secondary | ICD-10-CM | POA: Diagnosis not present

## 2020-04-29 DIAGNOSIS — E1100 Type 2 diabetes mellitus with hyperosmolarity without nonketotic hyperglycemic-hyperosmolar coma (NKHHC): Secondary | ICD-10-CM | POA: Diagnosis not present

## 2020-04-29 DIAGNOSIS — I10 Essential (primary) hypertension: Secondary | ICD-10-CM | POA: Diagnosis not present

## 2020-04-29 DIAGNOSIS — M130 Polyarthritis, unspecified: Secondary | ICD-10-CM | POA: Diagnosis not present

## 2020-07-26 DIAGNOSIS — E018 Other iodine-deficiency related thyroid disorders and allied conditions: Secondary | ICD-10-CM | POA: Diagnosis not present

## 2020-07-26 DIAGNOSIS — E1165 Type 2 diabetes mellitus with hyperglycemia: Secondary | ICD-10-CM | POA: Diagnosis not present

## 2020-07-26 DIAGNOSIS — E782 Mixed hyperlipidemia: Secondary | ICD-10-CM | POA: Diagnosis not present

## 2020-07-30 DIAGNOSIS — I10 Essential (primary) hypertension: Secondary | ICD-10-CM | POA: Diagnosis not present

## 2020-07-30 DIAGNOSIS — E039 Hypothyroidism, unspecified: Secondary | ICD-10-CM | POA: Diagnosis not present

## 2020-07-30 DIAGNOSIS — M130 Polyarthritis, unspecified: Secondary | ICD-10-CM | POA: Diagnosis not present

## 2020-07-30 DIAGNOSIS — E1100 Type 2 diabetes mellitus with hyperosmolarity without nonketotic hyperglycemic-hyperosmolar coma (NKHHC): Secondary | ICD-10-CM | POA: Diagnosis not present

## 2020-09-28 DIAGNOSIS — E1100 Type 2 diabetes mellitus with hyperosmolarity without nonketotic hyperglycemic-hyperosmolar coma (NKHHC): Secondary | ICD-10-CM | POA: Diagnosis not present

## 2020-09-28 DIAGNOSIS — I10 Essential (primary) hypertension: Secondary | ICD-10-CM | POA: Diagnosis not present

## 2020-09-28 DIAGNOSIS — M130 Polyarthritis, unspecified: Secondary | ICD-10-CM | POA: Diagnosis not present

## 2020-09-28 DIAGNOSIS — E039 Hypothyroidism, unspecified: Secondary | ICD-10-CM | POA: Diagnosis not present

## 2020-10-29 DIAGNOSIS — E039 Hypothyroidism, unspecified: Secondary | ICD-10-CM | POA: Diagnosis not present

## 2020-10-29 DIAGNOSIS — M130 Polyarthritis, unspecified: Secondary | ICD-10-CM | POA: Diagnosis not present

## 2020-10-29 DIAGNOSIS — E1100 Type 2 diabetes mellitus with hyperosmolarity without nonketotic hyperglycemic-hyperosmolar coma (NKHHC): Secondary | ICD-10-CM | POA: Diagnosis not present

## 2020-10-29 DIAGNOSIS — I10 Essential (primary) hypertension: Secondary | ICD-10-CM | POA: Diagnosis not present

## 2020-11-01 DIAGNOSIS — M130 Polyarthritis, unspecified: Secondary | ICD-10-CM | POA: Diagnosis not present

## 2020-11-01 DIAGNOSIS — E1169 Type 2 diabetes mellitus with other specified complication: Secondary | ICD-10-CM | POA: Diagnosis not present

## 2020-11-01 DIAGNOSIS — I10 Essential (primary) hypertension: Secondary | ICD-10-CM | POA: Diagnosis not present

## 2020-11-28 DIAGNOSIS — E119 Type 2 diabetes mellitus without complications: Secondary | ICD-10-CM | POA: Diagnosis not present

## 2020-11-28 DIAGNOSIS — I10 Essential (primary) hypertension: Secondary | ICD-10-CM | POA: Diagnosis not present

## 2020-11-28 DIAGNOSIS — E782 Mixed hyperlipidemia: Secondary | ICD-10-CM | POA: Diagnosis not present

## 2020-12-03 DIAGNOSIS — E782 Mixed hyperlipidemia: Secondary | ICD-10-CM | POA: Diagnosis not present

## 2020-12-03 DIAGNOSIS — E1169 Type 2 diabetes mellitus with other specified complication: Secondary | ICD-10-CM | POA: Diagnosis not present

## 2020-12-03 DIAGNOSIS — I10 Essential (primary) hypertension: Secondary | ICD-10-CM | POA: Diagnosis not present

## 2020-12-03 DIAGNOSIS — E039 Hypothyroidism, unspecified: Secondary | ICD-10-CM | POA: Diagnosis not present

## 2021-03-29 DIAGNOSIS — I10 Essential (primary) hypertension: Secondary | ICD-10-CM | POA: Diagnosis not present

## 2021-03-29 DIAGNOSIS — E039 Hypothyroidism, unspecified: Secondary | ICD-10-CM | POA: Diagnosis not present

## 2021-03-29 DIAGNOSIS — E1100 Type 2 diabetes mellitus with hyperosmolarity without nonketotic hyperglycemic-hyperosmolar coma (NKHHC): Secondary | ICD-10-CM | POA: Diagnosis not present

## 2021-04-25 DIAGNOSIS — M130 Polyarthritis, unspecified: Secondary | ICD-10-CM | POA: Diagnosis not present

## 2021-04-25 DIAGNOSIS — E782 Mixed hyperlipidemia: Secondary | ICD-10-CM | POA: Diagnosis not present

## 2021-04-25 DIAGNOSIS — E1169 Type 2 diabetes mellitus with other specified complication: Secondary | ICD-10-CM | POA: Diagnosis not present

## 2021-04-25 DIAGNOSIS — I10 Essential (primary) hypertension: Secondary | ICD-10-CM | POA: Diagnosis not present

## 2021-04-25 DIAGNOSIS — E039 Hypothyroidism, unspecified: Secondary | ICD-10-CM | POA: Diagnosis not present

## 2021-05-29 DIAGNOSIS — E1100 Type 2 diabetes mellitus with hyperosmolarity without nonketotic hyperglycemic-hyperosmolar coma (NKHHC): Secondary | ICD-10-CM | POA: Diagnosis not present

## 2021-05-29 DIAGNOSIS — E039 Hypothyroidism, unspecified: Secondary | ICD-10-CM | POA: Diagnosis not present

## 2021-05-29 DIAGNOSIS — I10 Essential (primary) hypertension: Secondary | ICD-10-CM | POA: Diagnosis not present

## 2021-06-29 DIAGNOSIS — E039 Hypothyroidism, unspecified: Secondary | ICD-10-CM | POA: Diagnosis not present

## 2021-06-29 DIAGNOSIS — I10 Essential (primary) hypertension: Secondary | ICD-10-CM | POA: Diagnosis not present

## 2021-06-29 DIAGNOSIS — E1100 Type 2 diabetes mellitus with hyperosmolarity without nonketotic hyperglycemic-hyperosmolar coma (NKHHC): Secondary | ICD-10-CM | POA: Diagnosis not present

## 2021-07-08 ENCOUNTER — Other Ambulatory Visit: Payer: Self-pay

## 2021-07-08 ENCOUNTER — Encounter: Payer: Self-pay | Admitting: Gastroenterology

## 2021-07-08 ENCOUNTER — Ambulatory Visit: Payer: Medicare Other | Admitting: Gastroenterology

## 2021-07-08 DIAGNOSIS — Z1211 Encounter for screening for malignant neoplasm of colon: Secondary | ICD-10-CM | POA: Diagnosis not present

## 2021-07-08 DIAGNOSIS — Z8719 Personal history of other diseases of the digestive system: Secondary | ICD-10-CM | POA: Diagnosis not present

## 2021-07-08 DIAGNOSIS — Z01818 Encounter for other preprocedural examination: Secondary | ICD-10-CM | POA: Diagnosis not present

## 2021-07-08 DIAGNOSIS — Z800 Family history of malignant neoplasm of digestive organs: Secondary | ICD-10-CM | POA: Diagnosis not present

## 2021-07-08 NOTE — Patient Instructions (Signed)
We are arranging a colonoscopy in the near future!  I will try to get the last blood work from your PCP so we can have on file.  I recommend taking the OTC reflux medication daily to protect your esophagus.   Further recommendations to follow!  It was a pleasure to see you today. I want to create trusting relationships with patients to provide genuine, compassionate, and quality care. I value your feedback. If you receive a survey regarding your visit,  I greatly appreciate you taking time to fill this out.   Annitta Needs, PhD, ANP-BC [facility]

## 2021-07-08 NOTE — Progress Notes (Signed)
Primary Care Physician:  Lucianne Lei, MD Primary Gastroenterologist:  Dr.   Laurel Dimmer Complaint  Patient presents with   Colonoscopy    Thinks last tcs was 2007, no problems. Thinks father had colon cancer    HPI:   Kevin [name] is a 75 y.o. male presenting today to arrange high risk screening colonoscopy due to family history of colon cancer in father. Patient's last colonoscopy was around 2007 by Dr. Benson Norway. History of what was felt to be likely diverticular bleeding in 2019. EGD on file from 2019 with erosive esophagitis, erosive gastropathy, non-obstructing Schatzki's ring.   Denies any abdominal pain, N/V. No dysphagia. Takes Prevacid for GERD but only prn. Several days a week.  No overt GI bleeding. No unexplained weight loss or lack of appetite.   Past Medical History:  Diagnosis Date   Alcohol abuse    Arthritis    Diabetes mellitus without complication ([place])    Diabetes mellitus, type II ([place])    GERD (gastroesophageal reflux disease)    Gout    Hypertension    Hyperthyroidism     Past Surgical History:  Procedure Laterality Date   BIOPSY  03/12/2018   Procedure: BIOPSY;  Surgeon: Daneil Dolin, MD;  Location: [facility];  Service: Endoscopy;;  gastric   COLONOSCOPY     remote past by Dr. Benson Norway, ? 2007   ESOPHAGOGASTRODUODENOSCOPY N/A 03/12/2018   erosive esophagitis, erosive gastropathy, non-obstructing Schatzki's ring.   NO PAST SURGERIES      Current Outpatient Medications  Medication Sig Dispense Refill   alum & mag hydroxide-simeth (MAALOX/MYLANTA) 200-200-20 MG/5ML suspension Take 30 mLs by mouth every 6 (six) hours as needed for indigestion or heartburn.     lansoprazole (PREVACID) 15 MG capsule Take 15 mg by mouth daily at 12 noon.     levothyroxine (SYNTHROID, LEVOTHROID) 100 MCG tablet Take 1 tablet (100 mcg total) by mouth daily. For thyroid hormone replacement     metFORMIN (GLUCOPHAGE) 1000 MG tablet Take 1,000 mg by mouth at bedtime.  3    Olmesartan-Amlodipine-HCTZ (TRIBENZOR) 40-10-12.5 MG TABS Take 1 tablet by mouth daily. For hypertension 30 tablet    predniSONE (DELTASONE) 5 MG tablet Take 1 tablet by mouth daily.     No current facility-administered medications for this visit.    Allergies as of 07/08/2021 - Review Complete 07/08/2021  Allergen Reaction Noted   Trazodone and nefazodone  01/31/2014    Family History  Problem Relation Age of Onset   Colon cancer Father 55   Alcohol abuse Paternal Aunt    Drug abuse Paternal Uncle    Alcohol abuse Paternal Uncle     Social History   Socioeconomic History   Marital status: Single    Spouse name: Not on file   Number of children: Not on file   Years of education: Not on file   Highest education level: Not on file  Occupational History   Not on file  Tobacco Use   Smoking status: Never   Smokeless tobacco: Never  Substance and Sexual Activity   Alcohol use: Not Currently    Comment: beer about once a week. history of ETOH abuse.   Drug use: Yes   Sexual activity: Yes  Other Topics Concern   Not on file  Social History Narrative   Not on file   Social Determinants of Health   Financial Resource Strain: Not on file  Food Insecurity: Not on file  Transportation Needs:  Not on file  Physical Activity: Not on file  Stress: Not on file  Social Connections: Not on file  Intimate Partner Violence: Not on file    Review of Systems: Gen: Denies any fever, chills, fatigue, weight loss, lack of appetite.  CV: Denies chest pain, heart palpitations, peripheral edema, syncope.  Resp: Denies shortness of breath at rest or with exertion. Denies wheezing or cough.  GI: see HPI GU : Denies urinary burning, urinary frequency, urinary hesitancy MS: Denies joint pain, muscle weakness, cramps, or limitation of movement.  Derm: Denies rash, itching, dry skin Psych: Denies depression, anxiety, memory loss, and confusion Heme: Denies bruising, bleeding, and enlarged  lymph nodes.  Physical Exam: BP 125/72   Pulse 62   Temp 97.7 F (36.5 C) (Temporal)   Ht '6\' 1"'$  (1.854 m)   Wt 208 lb 3.2 oz (94.4 kg)   BMI 27.47 kg/m  General:   Alert and oriented. Pleasant and cooperative. Well-nourished and well-developed.  Head:  Normocephalic and atraumatic. Eyes:  Without icterus, sclera clear and conjunctiva pink.  Ears:  Normal auditory acuity. Mouth:  mask in place Lungs:  Clear to auscultation bilaterally. No wheezes, rales, or rhonchi. No distress.  Heart:  S1, S2 present without murmurs appreciated.  Abdomen:  +BS, soft, non-tender and non-distended. No HSM noted. No guarding or rebound. No masses appreciated.  Rectal:  Deferred  Msk:  Symmetrical without gross deformities. Normal posture. Extremities:  Without edema. Neurologic:  Alert and  oriented x4;  grossly normal neurologically. Skin:  Intact without significant lesions or rashes. Psych:  Alert and cooperative. Normal mood and affect.  ASSESSMENT: Kevin [name] is a 75 y.o. male presenting today with need for high risk screening colonoscopy, with last in 2007 by Dr. Benson Norway and reportedly normal. Father with history of colon cancer. No concerning upper or lower GI signs/symptoms reported by patient.  GERD: taking Prevacid only prn. Start taking daily with history of erosive esophagitis.    PLAN:  Proceed with colonoscopy by Dr. Gala Romney in near future using PROPOFOL: the risks, benefits, and alternatives have been discussed with the patient in detail. The patient states understanding and desires to proceed.  Further recommendations to follow  Annitta Needs, PhD, ANP-BC [facility]

## 2021-07-16 ENCOUNTER — Encounter: Payer: Self-pay | Admitting: Gastroenterology

## 2021-07-30 DIAGNOSIS — E039 Hypothyroidism, unspecified: Secondary | ICD-10-CM | POA: Diagnosis not present

## 2021-07-30 DIAGNOSIS — E1100 Type 2 diabetes mellitus with hyperosmolarity without nonketotic hyperglycemic-hyperosmolar coma (NKHHC): Secondary | ICD-10-CM | POA: Diagnosis not present

## 2021-07-30 DIAGNOSIS — I10 Essential (primary) hypertension: Secondary | ICD-10-CM | POA: Diagnosis not present

## 2021-07-31 ENCOUNTER — Telehealth: Payer: Self-pay | Admitting: *Deleted

## 2021-07-31 MED ORDER — CLENPIQ 10-3.5-12 MG-GM -GM/160ML PO SOLN: 1.0000 | Freq: Once | ORAL | 0 refills | Status: AC

## 2021-07-31 NOTE — Telephone Encounter (Signed)
Patient returned call. He has been scheduled for 10/3 at 7:30am, arrival 6:30am. Aware will need lab work done Friday prior 9/30. Aware will send rx to pharmacy. Will mail prep instructions. Confirmed pharmacy.   PA approved via [facility] website. Auth# [id number], DOS: Sep 01, 2021 - Nov 29, 2021

## 2021-07-31 NOTE — Telephone Encounter (Signed)
LMOVM to call back to schedule TCS with Dr. Gala Romney, ASA 2, needs BMET also prior, no metformin day of procedure

## 2021-08-28 ENCOUNTER — Telehealth: Payer: Self-pay | Admitting: Internal Medicine

## 2021-08-28 NOTE — Telephone Encounter (Signed)
Pt called to let us know that he was a diabetic and is scheduled procedure with RMR on 09/01/2021.   [phone]

## 2021-08-28 NOTE — Telephone Encounter (Signed)
See pt instructions. His diabetic medications are already adjusted.  Called pt, and he is aware. He stated he see's the instructions for this now

## 2021-08-29 ENCOUNTER — Other Ambulatory Visit (HOSPITAL_COMMUNITY)
Admission: RE | Admit: 2021-08-29 | Discharge: 2021-08-29 | Disposition: A | Discharge status: Home / Self Care | Payer: Medicare Other | Source: Ambulatory Visit | Attending: Internal Medicine | Admitting: Internal Medicine

## 2021-08-29 ENCOUNTER — Other Ambulatory Visit: Payer: Self-pay | Admitting: *Deleted

## 2021-08-29 ENCOUNTER — Other Ambulatory Visit: Payer: Self-pay

## 2021-08-29 DIAGNOSIS — Z1211 Encounter for screening for malignant neoplasm of colon: Secondary | ICD-10-CM | POA: Insufficient documentation

## 2021-08-29 DIAGNOSIS — E039 Hypothyroidism, unspecified: Secondary | ICD-10-CM | POA: Diagnosis not present

## 2021-08-29 DIAGNOSIS — E1100 Type 2 diabetes mellitus with hyperosmolarity without nonketotic hyperglycemic-hyperosmolar coma (NKHHC): Secondary | ICD-10-CM | POA: Diagnosis not present

## 2021-08-29 DIAGNOSIS — E1169 Type 2 diabetes mellitus with other specified complication: Secondary | ICD-10-CM | POA: Diagnosis not present

## 2021-08-29 DIAGNOSIS — Z0001 Encounter for general adult medical examination with abnormal findings: Secondary | ICD-10-CM | POA: Diagnosis not present

## 2021-08-29 DIAGNOSIS — I10 Essential (primary) hypertension: Secondary | ICD-10-CM | POA: Diagnosis not present

## 2021-08-29 LAB — BASIC METABOLIC PANEL
Anion gap: 7 (ref 5–15)
BUN: 19 mg/dL (ref 8–23)
CO2: 29 mmol/L (ref 22–32)
Calcium: 9.5 mg/dL (ref 8.9–10.3)
Chloride: 103 mmol/L (ref 98–111)
Creatinine, Ser: 1.37 mg/dL — ABNORMAL HIGH (ref 0.61–1.24)
GFR, Estimated: 55 mL/min — ABNORMAL LOW (ref >60)
Glucose, Bld: 104 mg/dL — ABNORMAL HIGH (ref 70–99)
Potassium: 4.1 mmol/L (ref 3.5–5.1)
Sodium: 139 mmol/L (ref 135–145)

## 2021-09-01 ENCOUNTER — Other Ambulatory Visit: Payer: Self-pay

## 2021-09-01 ENCOUNTER — Ambulatory Visit (HOSPITAL_COMMUNITY): Payer: Medicare Other | Admitting: Certified Registered"

## 2021-09-01 ENCOUNTER — Encounter (HOSPITAL_COMMUNITY): Payer: Self-pay | Admitting: Internal Medicine

## 2021-09-01 ENCOUNTER — Ambulatory Visit (HOSPITAL_COMMUNITY)
Admission: RE | Admit: 2021-09-01 | Discharge: 2021-09-01 | Disposition: A | Discharge status: Home / Self Care | Payer: Medicare Other | Attending: Internal Medicine | Admitting: Internal Medicine

## 2021-09-01 ENCOUNTER — Encounter (HOSPITAL_COMMUNITY)
Admission: RE | Disposition: A | Discharge status: Home / Self Care | Payer: Self-pay | Source: Home / Self Care | Attending: Internal Medicine

## 2021-09-01 DIAGNOSIS — K641 Second degree hemorrhoids: Secondary | ICD-10-CM | POA: Insufficient documentation

## 2021-09-01 DIAGNOSIS — D124 Benign neoplasm of descending colon: Secondary | ICD-10-CM

## 2021-09-01 DIAGNOSIS — Z800 Family history of malignant neoplasm of digestive organs: Secondary | ICD-10-CM | POA: Diagnosis not present

## 2021-09-01 DIAGNOSIS — Z1211 Encounter for screening for malignant neoplasm of colon: Secondary | ICD-10-CM | POA: Diagnosis not present

## 2021-09-01 DIAGNOSIS — Z7952 Long term (current) use of systemic steroids: Secondary | ICD-10-CM | POA: Diagnosis not present

## 2021-09-01 DIAGNOSIS — K644 Residual hemorrhoidal skin tags: Secondary | ICD-10-CM | POA: Diagnosis not present

## 2021-09-01 DIAGNOSIS — D122 Benign neoplasm of ascending colon: Secondary | ICD-10-CM | POA: Insufficient documentation

## 2021-09-01 DIAGNOSIS — Z7984 Long term (current) use of oral hypoglycemic drugs: Secondary | ICD-10-CM | POA: Insufficient documentation

## 2021-09-01 DIAGNOSIS — E119 Type 2 diabetes mellitus without complications: Secondary | ICD-10-CM | POA: Insufficient documentation

## 2021-09-01 DIAGNOSIS — K5140 Inflammatory polyps of colon without complications: Secondary | ICD-10-CM | POA: Insufficient documentation

## 2021-09-01 DIAGNOSIS — Z888 Allergy status to other drugs, medicaments and biological substances status: Secondary | ICD-10-CM | POA: Diagnosis not present

## 2021-09-01 DIAGNOSIS — K5730 Diverticulosis of large intestine without perforation or abscess without bleeding: Secondary | ICD-10-CM | POA: Insufficient documentation

## 2021-09-01 DIAGNOSIS — K635 Polyp of colon: Secondary | ICD-10-CM | POA: Diagnosis not present

## 2021-09-01 DIAGNOSIS — D123 Benign neoplasm of transverse colon: Secondary | ICD-10-CM | POA: Diagnosis not present

## 2021-09-01 HISTORY — PX: POLYPECTOMY: SHX149

## 2021-09-01 HISTORY — PX: COLONOSCOPY WITH PROPOFOL: SHX5780

## 2021-09-01 HISTORY — PX: SUBMUCOSAL TATTOO INJECTION: SHX6856

## 2021-09-01 LAB — GLUCOSE, CAPILLARY: Glucose-Capillary: 165 mg/dL — ABNORMAL HIGH (ref 70–99)

## 2021-09-01 SURGERY — COLONOSCOPY WITH PROPOFOL
Anesthesia: General

## 2021-09-01 MED ORDER — PROPOFOL 500 MG/50ML IV EMUL
INTRAVENOUS | Status: DC | PRN
  Administered 2021-09-01: 150 ug/kg/min via INTRAVENOUS

## 2021-09-01 MED ORDER — SPOT INK MARKER SYRINGE KIT
PACK | SUBMUCOSAL | Status: AC
  Filled 2021-09-01: qty 5

## 2021-09-01 MED ORDER — PROPOFOL 10 MG/ML IV BOLUS
INTRAVENOUS | Status: DC | PRN
  Administered 2021-09-01: 100 mg via INTRAVENOUS
  Administered 2021-09-01: 30 mg via INTRAVENOUS

## 2021-09-01 MED ORDER — STERILE WATER FOR IRRIGATION IR SOLN
Status: DC | PRN
  Administered 2021-09-01: 200 mL

## 2021-09-01 MED ORDER — SPOT INK MARKER SYRINGE KIT
PACK | SUBMUCOSAL | Status: DC | PRN
Start: 2021-09-01 — End: 2021-09-01
  Administered 2021-09-01: .5 mL via SUBMUCOSAL

## 2021-09-01 MED ORDER — LACTATED RINGERS IV SOLN: INTRAVENOUS | Status: DC

## 2021-09-01 MED ORDER — SODIUM CHLORIDE FLUSH 0.9 % IV SOLN
INTRAVENOUS | Status: AC
  Filled 2021-09-01: qty 10

## 2021-09-01 MED ORDER — LIDOCAINE HCL (CARDIAC) PF 100 MG/5ML IV SOSY
PREFILLED_SYRINGE | INTRAVENOUS | Status: DC | PRN
  Administered 2021-09-01: 50 mg via INTRAVENOUS

## 2021-09-01 NOTE — Transfer of Care (Signed)
Immediate Anesthesia Transfer of Care Note  Patient: Kevin [name]  Procedure(s) Performed: COLONOSCOPY WITH PROPOFOL POLYPECTOMY INTESTINAL SUBMUCOSAL TATTOO INJECTION  Patient Location: Endoscopy Unit  Anesthesia Type:General  Level of Consciousness: awake  Airway & Oxygen Therapy: Patient Spontanous Breathing  Post-op Assessment: Report given to RN and Post -op Vital signs reviewed and stable  Post vital signs: Reviewed and stable  Last Vitals:  Vitals Value Taken Time  BP    Temp    Pulse    Resp    SpO2      Last Pain:  Vitals:   09/01/21 0846  TempSrc: Oral  PainSc: 0-No pain      Patients Stated Pain Goal: 5 (61/68/37 2902)  Complications: No notable events documented.

## 2021-09-01 NOTE — Anesthesia Postprocedure Evaluation (Signed)
Anesthesia Post Note  Patient: Kevin [name]  Procedure(s) Performed: COLONOSCOPY WITH PROPOFOL POLYPECTOMY INTESTINAL SUBMUCOSAL TATTOO INJECTION  Patient location during evaluation: Phase II Anesthesia Type: General Level of consciousness: awake Pain management: pain level controlled Vital Signs Assessment: post-procedure vital signs reviewed and stable Respiratory status: spontaneous breathing and respiratory function stable Cardiovascular status: blood pressure returned to baseline and stable Postop Assessment: no headache and no apparent nausea or vomiting Anesthetic complications: no Comments: Late entry   No notable events documented.   Last Vitals:  Vitals:   09/01/21 0701 09/01/21 0846  BP: (!) 111/45 (!) 93/57  Pulse: 90   Resp: 18 20  Temp: 36.8 C 36.5 C  SpO2: 97% 100%    Last Pain:  Vitals:   09/01/21 0846  TempSrc: Oral  PainSc: 0-No pain                 Louann Sjogren

## 2021-09-01 NOTE — Discharge Instructions (Signed)
  Colonoscopy Discharge Instructions  Read the instructions outlined below and refer to this sheet in the next few weeks. These discharge instructions provide you with general information on caring for yourself after you leave the hospital. Your doctor may also give you specific instructions. While your treatment has been planned according to the most current medical practices available, unavoidable complications occasionally occur. If you have any problems or questions after discharge, call Dr. Gala Romney at [phone]. ACTIVITY You may resume your regular activity, but move at a slower pace for the next 24 hours.  Take frequent rest periods for the next 24 hours.  Walking will help get rid of the air and reduce the bloated feeling in your belly (abdomen).  No driving for 24 hours (because of the medicine (anesthesia) used during the test).   Do not sign any important legal documents or operate any machinery for 24 hours (because of the anesthesia used during the test).  NUTRITION Drink plenty of fluids.  You may resume your normal diet as instructed by your doctor.  Begin with a light meal and progress to your normal diet. Heavy or fried foods are harder to digest and may make you feel sick to your stomach (nauseated).  Avoid alcoholic beverages for 24 hours or as instructed.  MEDICATIONS You may resume your normal medications unless your doctor tells you otherwise.  WHAT YOU CAN EXPECT TODAY Some feelings of bloating in the abdomen.  Passage of more gas than usual.  Spotting of blood in your stool or on the toilet paper.  IF YOU HAD POLYPS REMOVED DURING THE COLONOSCOPY: No aspirin products for 7 days or as instructed.  No alcohol for 7 days or as instructed.  Eat a soft diet for the next 24 hours.  FINDING OUT THE RESULTS OF YOUR TEST Not all test results are available during your visit. If your test results are not back during the visit, make an appointment with your caregiver to find out the  results. Do not assume everything is normal if you have not heard from your caregiver or the medical facility. It is important for you to follow up on all of your test results.  SEEK IMMEDIATE MEDICAL ATTENTION IF: You have more than a spotting of blood in your stool.  Your belly is swollen (abdominal distention).  You are nauseated or vomiting.  You have a temperature over 101.  You have abdominal pain or discomfort that is severe or gets worse throughout the day.    3 polyps removed from your colon today  Diverticulosis and colon polyp information provided  Further recommendations to follow pending review of pathology report

## 2021-09-01 NOTE — Anesthesia Preprocedure Evaluation (Signed)
Anesthesia Evaluation  Patient identified by MRN, date of birth, ID band Patient awake    Reviewed: Allergy & Precautions, H&P , NPO status , Patient's Chart, lab work & pertinent test results, reviewed documented beta blocker date and time   Airway Mallampati: II  TM Distance: >3 FB Neck ROM: full    Dental no notable dental hx.    Pulmonary neg pulmonary ROS,    Pulmonary exam normal breath sounds clear to auscultation       Cardiovascular Exercise Tolerance: Good hypertension, negative cardio ROS   Rhythm:regular Rate:Normal     Neuro/Psych PSYCHIATRIC DISORDERS Anxiety negative neurological ROS     GI/Hepatic Neg liver ROS, GERD  Medicated,  Endo/Other  diabetesHyperthyroidism   Renal/GU CRFRenal disease  negative genitourinary   Musculoskeletal   Abdominal   Peds  Hematology  (+) Blood dyscrasia, anemia ,   Anesthesia Other Findings   Reproductive/Obstetrics negative OB ROS                             Anesthesia Physical Anesthesia Plan  ASA: 3  Anesthesia Plan: General   Post-op Pain Management:    Induction:   PONV Risk Score and Plan: Propofol infusion  Airway Management Planned:   Additional Equipment:   Intra-op Plan:   Post-operative Plan:   Informed Consent: I have reviewed the patients History and Physical, chart, labs and discussed the procedure including the risks, benefits and alternatives for the proposed anesthesia with the patient or authorized representative who has indicated his/her understanding and acceptance.     Dental Advisory Given  Plan Discussed with: CRNA  Anesthesia Plan Comments:         Anesthesia Quick Evaluation

## 2021-09-01 NOTE — Op Note (Signed)
[facility] Patient Name: Kevin [name] Procedure Date: 09/01/2021 7:51 AM MRN: [id number] Date of Birth: 1946-09-09 Attending MD: Norvel Richards , MD CSN: [id number] Age: 75 Admit Type: Outpatient Procedure:                Colonoscopy Indications:              Screening for colorectal malignant neoplasm Providers:                Norvel Richards, MD, [place], [place]                            Risa Grill, Technician Referring MD:              Medicines:                Propofol per Anesthesia Complications:            No immediate complications. Estimated Blood Loss:     Estimated blood loss was minimal. Procedure:                Pre-Anesthesia Assessment:                           - Prior to the procedure, a History and Physical                            was performed, and patient medications and                            allergies were reviewed. The patient's tolerance of                            previous anesthesia was also reviewed. The risks                            and benefits of the procedure and the sedation                            options and risks were discussed with the patient.                            All questions were answered, and informed consent                            was obtained. ASA Grade Assessment: II - A patient                            with mild systemic disease. After reviewing the                            risks and benefits, the patient was deemed in                            satisfactory condition to undergo the procedure.  After obtaining informed consent, the colonoscope                            was passed under direct vision. Throughout the                            procedure, the patient's blood pressure, pulse, and                            oxygen saturations were monitored continuously. The                            [id number]) scope was introduced through the                             anus and advanced to the the cecum, identified by                            appendiceal orifice and ileocecal valve. The                            colonoscopy was performed without difficulty. The                            patient tolerated the procedure well. The quality                            of the bowel preparation was adequate. Scope In: 8:09:29 AM Scope Out: 8:41:48 AM Scope Withdrawal Time: 0 hours 10 minutes 33 seconds  Total Procedure Duration: 0 hours 32 minutes 19 seconds  Findings:      Hemorrhoids were found on perianal exam.      The exam was otherwise without abnormality on direct and retroflexion       views.      Many large-mouthed diverticula were found in the entire colon.      Two semi-pedunculated polyps were found in the hepatic flexure and       ascending colon. The polyps were 4 to 6 mm in size. These polyps were       removed with a cold snare. Resection and retrieval were complete.       Estimated blood loss was minimal.      A 9 mm polyp was found in the descending colon. The polyp was       semi-pedunculated. This polyp was between the bottom of a fold and the       rim of a large diverticulum. Total extent was well seen. The polyp was       removed with a hot snare x 2 passes. Resection and retrieval were       complete. Estimated blood loss: none. 0.5 cc of ink placed submucosally       2 cm distal to the polypectomy.      Non-bleeding external and internal hemorrhoids were found during       retroflexion. The hemorrhoids were moderate, medium-sized and Grade II       (internal hemorrhoids that prolapse but reduce spontaneously).      The exam was otherwise  without abnormality on direct and retroflexion       views. Impression:               - Hemorrhoids found on perianal exam.                           - Diverticulosis in the entire examined colon.                           - Two 4 to 6 mm polyps at the hepatic flexure and                             in the ascending colon, removed with a cold snare.                            Resected and retrieved.                           - One 9 mm polyp in the descending colon, removed                            with a hot snare. Resected and retrieved. Site                            tattooed.                           - Non-bleeding external and internal hemorrhoids.                           - The examination was otherwise normal on direct                            and retroflexion views. Moderate Sedation:      Moderate (conscious) sedation was personally administered by an       anesthesia professional. The following parameters were monitored: oxygen       saturation, heart rate, blood pressure, respiratory rate, EKG, adequacy       of pulmonary ventilation, and response to care. Recommendation:           - Patient has a contact number available for                            emergencies. The signs and symptoms of potential                            delayed complications were discussed with the                            patient. Return to normal activities tomorrow.                            Written discharge instructions were provided to the  patient.                           - Resume previous diet.                           - Continue present medications.                           - Repeat colonoscopy date to be determined after                            pending pathology results are reviewed for                            surveillance.                           - Return to GI office (date not yet determined). Procedure Code(s):        --- Professional ---                           [phone], Colonoscopy, flexible; with removal of                            tumor(s), polyp(s), or other lesion(s) by snare                            technique Diagnosis Code(s):        --- Professional ---                           K63.5, Polyp of colon                            Z12.11, Encounter for screening for malignant                            neoplasm of colon                           K64.1, Second degree hemorrhoids                           K57.30, Diverticulosis of large intestine without                            perforation or abscess without bleeding CPT copyright 2019 American Medical Association. All rights reserved. The codes documented in this report are preliminary and upon coder review may  be revised to meet current compliance requirements. Cristopher Estimable. Kevin Trickel, MD Norvel Richards, MD 09/01/2021 8:50:25 AM This report has been signed electronically. Number of Addenda: 0

## 2021-09-01 NOTE — H&P (Signed)
@LOGO @   Primary Care Physician:  Lucianne Lei, MD Primary Gastroenterologist:  Dr. Gala Romney  Pre-Procedure History & Physical: HPI:  Kevin [name] is a 75 y.o. male here for a high rescreening colonoscopy.  Father with colon cancer.  Last colonoscopy in the distant past in [place].  Diverticulosis only at that time reportedly.  No bowel symptoms at this time.  Past Medical History:  Diagnosis Date   Alcohol abuse    Arthritis    Diabetes mellitus without complication ([place])    Diabetes mellitus, type II ([place])    GERD (gastroesophageal reflux disease)    Gout    Hypertension    Hyperthyroidism     Past Surgical History:  Procedure Laterality Date   BIOPSY  03/12/2018   Procedure: BIOPSY;  Surgeon: Daneil Dolin, MD;  Location[facility];  Service: Endoscopy;;  gastric   COLONOSCOPY     remote past by Dr. Benson Norway, ? 2007   ESOPHAGOGASTRODUODENOSCOPY N/A 03/12/2018   erosive esophagitis, erosive gastropathy, non-obstructing Schatzki's ring.   NO PAST SURGERIES      Prior to Admission medications   Medication Sig Start Date End Date Taking? Authorizing Provider  levothyroxine (SYNTHROID, LEVOTHROID) 100 MCG tablet Take 1 tablet (100 mcg total) by mouth daily. For thyroid hormone replacement 01/17/14  Yes Lindell Spar I, NP  metFORMIN (GLUCOPHAGE) 1000 MG tablet Take 1,000 mg by mouth at bedtime. 02/15/18  Yes [provider]  Olmesartan-Amlodipine-HCTZ (TRIBENZOR) 40-10-12.5 MG TABS Take 1 tablet by mouth daily. For hypertension 01/17/14  Yes Lindell Spar I, NP  OMEPRAZOLE PO Take 1 tablet by mouth daily as needed (heartburn).   Yes [provider]  predniSONE (DELTASONE) 5 MG tablet Take 5 mg by mouth daily. 04/30/21  Yes [provider]  CLENPIQ 10-3.5-12 MG-GM -GM/160ML SOLN Take 320 mLs by mouth as directed. 07/31/21   [provider]    Allergies as of 07/31/2021 - Review Complete 07/08/2021  Allergen Reaction Noted   Trazodone and  nefazodone  01/31/2014    Family History  Problem Relation Age of Onset   Colon cancer Father 56   Alcohol abuse Paternal Aunt    Drug abuse Paternal Uncle    Alcohol abuse Paternal Uncle     Social History   Socioeconomic History   Marital status: Single    Spouse name: Not on file   Number of children: Not on file   Years of education: Not on file   Highest education level: Not on file  Occupational History   Not on file  Tobacco Use   Smoking status: Never   Smokeless tobacco: Never  Vaping Use   Vaping Use: Never used  Substance and Sexual Activity   Alcohol use: Not Currently    Comment: beer about once a week. history of ETOH abuse.   Drug use: Yes   Sexual activity: Yes  Other Topics Concern   Not on file  Social History Narrative   Not on file   Social Determinants of Health   Financial Resource Strain: Not on file  Food Insecurity: Not on file  Transportation Needs: Not on file  Physical Activity: Not on file  Stress: Not on file  Social Connections: Not on file  Intimate Partner Violence: Not on file    Review of Systems: See HPI, otherwise negative ROS  Physical Exam: BP (!) 111/45   Pulse 90   Temp 98.2 F (36.8 C) (Oral)   Resp 18   Ht 6' (  1.829 m)   Wt 94.3 kg   SpO2 97%   BMI 28.21 kg/m  General:   Alert,  Well-developed, well-nourished, pleasant and cooperative in NAD . No significant cervical adenopathy. Lungs:  Clear throughout to auscultation.   No wheezes, crackles, or rhonchi. No acute distress. Heart:  Regular rate and rhythm; no murmurs, clicks, rubs,  or gallops. Abdomen: Non-distended, normal bowel sounds.  Soft and nontender without appreciable mass or hepatosplenomegaly.  Pulses:  Normal pulses noted. Extremities:  Without clubbing or edema.  Impression/Plan: 75 year old gentleman here for high risk screening colonoscopy as outlined above. The risks, benefits, limitations, alternatives and imponderables have been  reviewed with the patient. Questions have been answered. All parties are agreeable.       Notice: This dictation was prepared with Dragon dictation along with smaller phrase technology. Any transcriptional errors that result from this process are unintentional and may not be corrected upon review.

## 2021-09-01 NOTE — Anesthesia Procedure Notes (Signed)
Date/Time: 09/01/2021 8:10 AM Performed by: Orlie Dakin, CRNA Pre-anesthesia Checklist: Patient identified, Emergency Drugs available, Suction available and Patient being monitored Patient Re-evaluated:Patient Re-evaluated prior to induction Oxygen Delivery Method: Nasal cannula Induction Type: IV induction Placement Confirmation: positive ETCO2

## 2021-09-02 ENCOUNTER — Encounter: Payer: Self-pay | Admitting: Internal Medicine

## 2021-09-02 LAB — SURGICAL PATHOLOGY

## 2021-09-08 ENCOUNTER — Encounter (HOSPITAL_COMMUNITY): Payer: Self-pay | Admitting: Internal Medicine

## 2021-10-11 ENCOUNTER — Emergency Department (HOSPITAL_COMMUNITY)
Admission: EM | Admit: 2021-10-11 | Discharge: 2021-10-30 | Disposition: E | Payer: Medicare Other | Attending: Emergency Medicine | Admitting: Emergency Medicine

## 2021-10-11 DIAGNOSIS — Y92410 Unspecified street and highway as the place of occurrence of the external cause: Secondary | ICD-10-CM | POA: Insufficient documentation

## 2021-10-11 DIAGNOSIS — T1490XA Injury, unspecified, initial encounter: Secondary | ICD-10-CM

## 2021-10-11 DIAGNOSIS — I469 Cardiac arrest, cause unspecified: Secondary | ICD-10-CM | POA: Diagnosis not present

## 2021-10-11 DIAGNOSIS — E161 Other hypoglycemia: Secondary | ICD-10-CM | POA: Diagnosis not present

## 2021-10-11 DIAGNOSIS — R404 Transient alteration of awareness: Secondary | ICD-10-CM | POA: Diagnosis not present

## 2021-10-11 DIAGNOSIS — M549 Dorsalgia, unspecified: Secondary | ICD-10-CM | POA: Diagnosis not present

## 2021-10-11 DIAGNOSIS — R6889 Other general symptoms and signs: Secondary | ICD-10-CM | POA: Diagnosis not present

## 2021-10-11 MED ORDER — DEXTROSE 50 % IV SOLN
INTRAVENOUS | Status: AC | PRN
  Administered 2021-10-11: 1 via INTRAVENOUS

## 2021-10-11 MED ORDER — SODIUM BICARBONATE 8.4 % IV SOLN
INTRAVENOUS | Status: AC | PRN
  Administered 2021-10-11: 50 meq via INTRAVENOUS

## 2021-10-11 MED ORDER — EPINEPHRINE 1 MG/10ML IJ SOSY
PREFILLED_SYRINGE | INTRAMUSCULAR | Status: AC | PRN
  Administered 2021-10-11 (×5): 1 mg via INTRAVENOUS

## 2021-10-11 MED ORDER — NALOXONE HCL 2 MG/2ML IJ SOSY
PREFILLED_SYRINGE | INTRAMUSCULAR | Status: AC | PRN
  Administered 2021-10-11: 2 mg via INTRAVENOUS

## 2021-10-11 MED ORDER — SODIUM CHLORIDE 0.9 % IV SOLN
INTRAVENOUS | Status: AC | PRN
  Administered 2021-10-11: 1000 mL via INTRAVENOUS

## 2021-10-12 LAB — BPAM RBC
Blood Product Expiration Date: 202211212359
ISSUE DATE / TIME: 202211121612
Unit Type and Rh: 5100

## 2021-10-12 LAB — TYPE AND SCREEN: Unit division: 0
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
## 2021-10-30 NOTE — ED Provider Notes (Signed)
Called to bedside to pronounce Kevin [name]. On physical exam, patient had no spontaneous respirations and was unresponsive to auditory or tactile stimuli. He had no cardiac sounds on auscultation and no carotid pulse. Pupils were fixed and dilated with no response to light.   Case is appropriate for medical examiner, accepted by [facility]  Time of Death: 21-Feb-1615  Davonna Belling, MD 2021/10/18 2156

## 2021-10-30 NOTE — Code Documentation (Signed)
Right sided needle decompression performed by Dr Langston Masker

## 2021-10-30 NOTE — ED Notes (Signed)
Spoke w/ Margot in pt placement. Body ok for transport to morgue
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
## 2021-10-30 NOTE — Code Documentation (Signed)
Pulse check: asystole, CPR resumed

## 2021-10-30 NOTE — ED Notes (Signed)
Intubated by Dr Langston Masker

## 2021-10-30 NOTE — Code Documentation (Signed)
Pulse check: PEA, Dr Langston Masker using ultrasound at bedside to assess cardiac activity. No movement.
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
## 2021-10-30 NOTE — Code Documentation (Signed)
Patient time of death occurred at 28.

## 2021-10-30 NOTE — Code Documentation (Signed)
Pulse check: aystole, CPR resumed

## 2021-10-30 DEATH — deceased
